# Patient Record
Sex: Female | Born: 1945 | Race: White | Hispanic: No | Marital: Married | Smoking: Never smoker
Health system: Southern US, Community
[De-identification: ages and names within clinical notes are randomized; demographics above are authoritative.]

## PROBLEM LIST (undated history)

## (undated) DIAGNOSIS — K219 Gastro-esophageal reflux disease without esophagitis: Secondary | ICD-10-CM

## (undated) DIAGNOSIS — I10 Essential (primary) hypertension: Secondary | ICD-10-CM

## (undated) DIAGNOSIS — C801 Malignant (primary) neoplasm, unspecified: Secondary | ICD-10-CM

## (undated) DIAGNOSIS — E785 Hyperlipidemia, unspecified: Secondary | ICD-10-CM

## (undated) DIAGNOSIS — E079 Disorder of thyroid, unspecified: Secondary | ICD-10-CM

## (undated) HISTORY — DX: Hyperlipidemia, unspecified: E78.5

## (undated) HISTORY — PX: SKIN CANCER EXCISION: SHX779

## (undated) HISTORY — DX: Essential (primary) hypertension: I10

## (undated) HISTORY — DX: Disorder of thyroid, unspecified: E07.9

## (undated) HISTORY — DX: Gastro-esophageal reflux disease without esophagitis: K21.9

## (undated) HISTORY — PX: APPENDECTOMY: SHX54

---

## 1979-07-05 DIAGNOSIS — C801 Malignant (primary) neoplasm, unspecified: Secondary | ICD-10-CM

## 1979-07-05 HISTORY — DX: Malignant (primary) neoplasm, unspecified: C80.1

## 1998-04-07 ENCOUNTER — Ambulatory Visit (HOSPITAL_COMMUNITY): Admission: RE | Admit: 1998-04-07 | Discharge: 1998-04-07 | Payer: Self-pay | Admitting: Neurosurgery

## 1998-04-07 ENCOUNTER — Encounter: Payer: Self-pay | Admitting: Neurosurgery

## 2005-06-16 ENCOUNTER — Ambulatory Visit: Payer: Self-pay | Admitting: Family Medicine

## 2006-06-20 ENCOUNTER — Ambulatory Visit: Payer: Self-pay | Admitting: Family Medicine

## 2006-07-04 HISTORY — PX: COLONOSCOPY: SHX174

## 2007-02-19 ENCOUNTER — Ambulatory Visit: Payer: Self-pay | Admitting: Gastroenterology

## 2007-08-01 ENCOUNTER — Ambulatory Visit: Payer: Self-pay | Admitting: Family Medicine

## 2008-03-26 ENCOUNTER — Ambulatory Visit: Payer: Self-pay | Admitting: Family Medicine

## 2008-08-05 ENCOUNTER — Ambulatory Visit: Payer: Self-pay | Admitting: Family Medicine

## 2009-08-10 ENCOUNTER — Ambulatory Visit: Payer: Self-pay | Admitting: Family Medicine

## 2010-08-12 ENCOUNTER — Ambulatory Visit: Payer: Self-pay | Admitting: Family Medicine

## 2011-08-16 ENCOUNTER — Ambulatory Visit: Payer: Self-pay | Admitting: Family Medicine

## 2011-08-18 ENCOUNTER — Ambulatory Visit: Payer: Self-pay | Admitting: Family Medicine

## 2012-03-19 ENCOUNTER — Ambulatory Visit: Payer: Self-pay | Admitting: Family Medicine

## 2012-08-23 ENCOUNTER — Ambulatory Visit: Payer: Self-pay | Admitting: Family Medicine

## 2013-09-03 ENCOUNTER — Ambulatory Visit: Payer: Self-pay | Admitting: Family Medicine

## 2014-09-16 ENCOUNTER — Ambulatory Visit: Payer: Self-pay | Admitting: Family Medicine

## 2015-01-30 ENCOUNTER — Encounter: Payer: Self-pay | Admitting: Family Medicine

## 2015-01-30 ENCOUNTER — Ambulatory Visit (INDEPENDENT_AMBULATORY_CARE_PROVIDER_SITE_OTHER): Payer: Medicare Other | Admitting: Family Medicine

## 2015-01-30 VITALS — BP 110/70 | HR 80 | Ht 66.0 in | Wt 148.0 lb

## 2015-01-30 DIAGNOSIS — E039 Hypothyroidism, unspecified: Secondary | ICD-10-CM | POA: Insufficient documentation

## 2015-01-30 DIAGNOSIS — E032 Hypothyroidism due to medicaments and other exogenous substances: Secondary | ICD-10-CM | POA: Diagnosis not present

## 2015-01-30 DIAGNOSIS — I10 Essential (primary) hypertension: Secondary | ICD-10-CM

## 2015-01-30 DIAGNOSIS — K219 Gastro-esophageal reflux disease without esophagitis: Secondary | ICD-10-CM

## 2015-01-30 DIAGNOSIS — E785 Hyperlipidemia, unspecified: Secondary | ICD-10-CM

## 2015-01-30 DIAGNOSIS — E7800 Pure hypercholesterolemia, unspecified: Secondary | ICD-10-CM | POA: Insufficient documentation

## 2015-01-30 MED ORDER — LEVOTHYROXINE SODIUM 25 MCG PO TABS: 25.0000 ug | ORAL_TABLET | Freq: Every day | ORAL | Status: DC

## 2015-01-30 MED ORDER — AMLODIPINE BESYLATE 5 MG PO TABS: 5.0000 mg | ORAL_TABLET | Freq: Every day | ORAL | Status: DC

## 2015-01-30 MED ORDER — EZETIMIBE 10 MG PO TABS: 10.0000 mg | ORAL_TABLET | Freq: Every day | ORAL | Status: DC

## 2015-01-30 MED ORDER — LOSARTAN POTASSIUM 50 MG PO TABS: 50.0000 mg | ORAL_TABLET | Freq: Every day | ORAL | Status: DC

## 2015-01-30 MED ORDER — PANTOPRAZOLE SODIUM 40 MG PO TBEC: 40.0000 mg | DELAYED_RELEASE_TABLET | Freq: Every day | ORAL | Status: DC

## 2015-01-30 NOTE — Progress Notes (Signed)
Name: Victoria Rush   MRN: [id number]    DOB: April 08, 1946   Date:01/30/2015       Progress Note  Subjective  Chief Complaint  Chief Complaint  Patient presents with  . Hypertension  . Hyperlipidemia  . Hypothyroidism  . Gastrophageal Reflux    Hypertension This is a recurrent problem. The current episode started more than 1 year ago. The problem has been gradually improving since onset. Pertinent negatives include no anxiety, blurred vision, chest pain, headaches, malaise/fatigue, neck pain, orthopnea, palpitations, peripheral edema, PND, shortness of breath or sweats. There are no associated agents to hypertension. Risk factors for coronary artery disease include dyslipidemia and post-menopausal state. Past treatments include calcium channel blockers and angiotensin blockers. The current treatment provides significant improvement. There are no compliance problems.  Hypertensive end-organ damage includes a thyroid problem. There is no history of angina, kidney disease, CAD/MI, CVA, heart failure, left ventricular hypertrophy, PVD or retinopathy. There is no history of chronic renal disease.  Hyperlipidemia This is a chronic problem. The current episode started more than 1 year ago. The problem is controlled. Recent lipid tests were reviewed and are normal. She has no history of chronic renal disease, diabetes, obesity or nephrotic syndrome. There are no known factors aggravating her hyperlipidemia. Pertinent negatives include no chest pain, focal weakness, myalgias or shortness of breath. Current antihyperlipidemic treatment includes ezetimibe. The current treatment provides moderate improvement of lipids. There are no compliance problems.   Gastrophageal Reflux She complains of heartburn. She reports no abdominal pain, no belching, no chest pain, no choking, no coughing, no dysphagia, no early satiety, no globus sensation, no hoarse voice, no nausea, no sore throat, no stridor or no wheezing. "once  in a while". This is a chronic problem. The current episode started more than 1 year ago. The problem occurs rarely. The problem has been gradually improving. The heartburn does not wake her from sleep. The heartburn does not limit her activity. Pertinent negatives include no anemia, fatigue, melena, muscle weakness, orthopnea or weight loss. She has tried a PPI for the symptoms. The treatment provided moderate relief.  Thyroid Problem Presents for follow-up visit. Patient reports no anxiety, cold intolerance, constipation, depressed mood, diaphoresis, diarrhea, dry skin, fatigue, hair loss, heat intolerance, hoarse voice, leg swelling, menstrual problem, nail problem, palpitations, weight gain or weight loss. The symptoms have been stable. Past treatments include levothyroxine. The treatment provided moderate relief. Her past medical history is significant for hyperlipidemia and neuropathy. There is no history of atrial fibrillation, diabetes, Graves' ophthalmopathy or heart failure. (Improved with exercise/ desires to stop gabapentin) There are no known risk factors.    No problem-specific assessment & plan notes found for this encounter.   Past Medical History  Diagnosis Date  . GERD (gastroesophageal reflux disease)   . Thyroid disease   . Hyperlipidemia   . Hypertension     Past Surgical History  Procedure Laterality Date  . Appendectomy    . Skin cancer excision      foot  . Colonoscopy  2008    Dr Sonny Masters- repeat in 10 years    Family History  Problem Relation Age of Onset  . Diabetes Sister   . Diabetes Son     History   Social History  . Marital Status: Married    Spouse Name: N/A  . Number of Children: N/A  . Years of Education: N/A   Occupational History  . Not on file.   Social History Main Topics  .  Smoking status: Never Smoker   . Smokeless tobacco: Not on file  . Alcohol Use: No  . Drug Use: No  . Sexual Activity: Not Currently   Other Topics Concern  .  Not on file   Social History Narrative  . No narrative on file    Allergies  Allergen Reactions  . Sulfa Antibiotics      Review of Systems  Constitutional: Negative for fever, chills, weight loss, weight gain, malaise/fatigue, diaphoresis and fatigue.  HENT: Negative for ear discharge, ear pain, hoarse voice and sore throat.   Eyes: Negative for blurred vision.  Respiratory: Negative for cough, sputum production, choking, shortness of breath and wheezing.   Cardiovascular: Negative for chest pain, palpitations, orthopnea, leg swelling and PND.  Gastrointestinal: Positive for heartburn. Negative for dysphagia, nausea, abdominal pain, diarrhea, constipation, blood in stool and melena.  Genitourinary: Negative for dysuria, urgency, frequency, hematuria and menstrual problem.  Musculoskeletal: Negative for myalgias, back pain, joint pain, muscle weakness and neck pain.  Skin: Negative for rash.  Neurological: Negative for dizziness, tingling, sensory change, focal weakness and headaches.  Endo/Heme/Allergies: Negative for environmental allergies, cold intolerance, heat intolerance and polydipsia. Does not bruise/bleed easily.  Psychiatric/Behavioral: Negative for depression and suicidal ideas. The patient is not nervous/anxious and does not have insomnia.      Objective  Filed Vitals:   01/30/15 0808  BP: 110/70  Pulse: 80  Height: 5\' 6"  (1.676 m)  Weight: 148 lb (67.132 kg)    Physical Exam  Constitutional: She is well-developed, well-nourished, and in no distress. No distress.  HENT:  Head: Normocephalic and atraumatic.  Right Ear: External ear normal.  Left Ear: External ear normal.  Nose: Nose normal.  Mouth/Throat: Oropharynx is clear and moist.  Eyes: Conjunctivae and EOM are normal. Pupils are equal, round, and reactive to light. Right eye exhibits no discharge. Left eye exhibits no discharge.  Neck: Normal range of motion. Neck supple. No JVD present. No  thyromegaly present.  Cardiovascular: Normal rate, regular rhythm, normal heart sounds and intact distal pulses.  Exam reveals no gallop and no friction rub.   No murmur heard. Pulmonary/Chest: Effort normal and breath sounds normal.  Abdominal: Soft. Bowel sounds are normal. She exhibits no mass. There is no tenderness. There is no guarding.  Musculoskeletal: Normal range of motion. She exhibits no edema.  Lymphadenopathy:    She has no cervical adenopathy.  Neurological: She is alert. She has normal reflexes.  Skin: Skin is warm and dry. She is not diaphoretic.  Psychiatric: Mood and affect normal.      Assessment & Plan  Problem List Items Addressed This Visit      Endocrine   Hypothyroidism   Relevant Medications   levothyroxine (SYNTHROID, LEVOTHROID) 25 MCG tablet   Other Relevant Orders   TSH     Other   Hyperlipidemia   Relevant Medications   aspirin 81 MG tablet   amLODipine (NORVASC) 5 MG tablet   ezetimibe (ZETIA) 10 MG tablet   losartan (COZAAR) 50 MG tablet   Other Relevant Orders   Lipid Profile    Other Visit Diagnoses    Essential hypertension    -  Primary    Relevant Medications    aspirin 81 MG tablet    amLODipine (NORVASC) 5 MG tablet    ezetimibe (ZETIA) 10 MG tablet    losartan (COZAAR) 50 MG tablet    Other Relevant Orders    Renal Function Panel  Gastroesophageal reflux disease, esophagitis presence not specified        Relevant Medications    pantoprazole (PROTONIX) 40 MG tablet         Dr. Mac[facility]  01/30/2015

## 2015-01-31 LAB — LIPID PANEL
CHOL/HDL RATIO: 4.2 ratio (ref 0.0–4.4)
Cholesterol, Total: 172 mg/dL (ref 100–199)
HDL: 41 mg/dL (ref >39)
LDL Calculated: 102 mg/dL — ABNORMAL HIGH (ref 0–99)
TRIGLYCERIDES: 146 mg/dL (ref 0–149)
VLDL Cholesterol Cal: 29 mg/dL (ref 5–40)

## 2015-01-31 LAB — TSH: TSH: 3.21 u[IU]/mL (ref 0.450–4.500)

## 2015-01-31 LAB — RENAL FUNCTION PANEL
Albumin: 4.4 g/dL (ref 3.6–4.8)
BUN/Creatinine Ratio: 8 — ABNORMAL LOW (ref 11–26)
BUN: 7 mg/dL — ABNORMAL LOW (ref 8–27)
CALCIUM: 9.2 mg/dL (ref 8.7–10.3)
CO2: 25 mmol/L (ref 18–29)
CREATININE: 0.85 mg/dL (ref 0.57–1.00)
Chloride: 99 mmol/L (ref 97–108)
GFR calc Af Amer: 81 mL/min/{1.73_m2} (ref >59)
GFR calc non Af Amer: 70 mL/min/{1.73_m2} (ref >59)
Glucose: 100 mg/dL — ABNORMAL HIGH (ref 65–99)
Phosphorus: 3.6 mg/dL (ref 2.5–4.5)
Potassium: 4.5 mmol/L (ref 3.5–5.2)
Sodium: 140 mmol/L (ref 134–144)

## 2015-03-25 ENCOUNTER — Other Ambulatory Visit: Payer: Self-pay | Admitting: Family Medicine

## 2015-03-25 DIAGNOSIS — E785 Hyperlipidemia, unspecified: Secondary | ICD-10-CM

## 2015-05-11 ENCOUNTER — Ambulatory Visit (INDEPENDENT_AMBULATORY_CARE_PROVIDER_SITE_OTHER): Payer: Medicare Other

## 2015-05-11 DIAGNOSIS — Z23 Encounter for immunization: Secondary | ICD-10-CM | POA: Diagnosis not present

## 2015-06-08 ENCOUNTER — Encounter: Payer: Self-pay | Admitting: Family Medicine

## 2015-06-08 ENCOUNTER — Ambulatory Visit (INDEPENDENT_AMBULATORY_CARE_PROVIDER_SITE_OTHER): Payer: Medicare Other | Admitting: Family Medicine

## 2015-06-08 VITALS — BP 100/70 | HR 64 | Temp 98.4°F | Ht 66.0 in | Wt 143.0 lb

## 2015-06-08 DIAGNOSIS — J0100 Acute maxillary sinusitis, unspecified: Secondary | ICD-10-CM

## 2015-06-08 DIAGNOSIS — J40 Bronchitis, not specified as acute or chronic: Secondary | ICD-10-CM | POA: Diagnosis not present

## 2015-06-08 MED ORDER — AMOXICILLIN 500 MG PO CAPS: 500.0000 mg | ORAL_CAPSULE | Freq: Three times a day (TID) | ORAL | Status: DC

## 2015-06-08 MED ORDER — GUAIFENESIN-CODEINE 100-10 MG/5ML PO SOLN: 5.0000 mL | Freq: Three times a day (TID) | ORAL | Status: DC | PRN

## 2015-06-08 NOTE — Progress Notes (Signed)
Name: Victoria Rush   MRN: [id number]    DOB: 05-Jun-1946   Date:06/08/2015       Progress Note  Subjective  Chief Complaint  Chief Complaint  Patient presents with  . Sinusitis    cough no production- otc Mucinex not working    Sinusitis This is a new problem. The current episode started in the past 7 days. The problem has been waxing and waning since onset. There has been no fever. Her pain is at a severity of 3/10. The pain is mild. Associated symptoms include chills, congestion, coughing, headaches, a hoarse voice, a sore throat and swollen glands. Pertinent negatives include no diaphoresis, ear pain, neck pain, shortness of breath, sinus pressure or sneezing. Past treatments include acetaminophen and oral decongestants. The treatment provided no relief.    No problem-specific assessment & plan notes found for this encounter.   Past Medical History  Diagnosis Date  . GERD (gastroesophageal reflux disease)   . Thyroid disease   . Hyperlipidemia   . Hypertension     Past Surgical History  Procedure Laterality Date  . Appendectomy    . Skin cancer excision      foot  . Colonoscopy  2008    Dr Sonny Masters- repeat in 10 years    Family History  Problem Relation Age of Onset  . Diabetes Sister   . Diabetes Son     Social History   Social History  . Marital Status: Married    Spouse Name: N/A  . Number of Children: N/A  . Years of Education: N/A   Occupational History  . Not on file.   Social History Main Topics  . Smoking status: Never Smoker   . Smokeless tobacco: Not on file  . Alcohol Use: No  . Drug Use: No  . Sexual Activity: Not Currently   Other Topics Concern  . Not on file   Social History Narrative    Allergies  Allergen Reactions  . Sulfa Antibiotics      Review of Systems  Constitutional: Positive for chills. Negative for fever, weight loss, malaise/fatigue and diaphoresis.  HENT: Positive for congestion, hoarse voice and sore throat.  Negative for ear discharge, ear pain, sinus pressure and sneezing.   Eyes: Negative for blurred vision.  Respiratory: Positive for cough. Negative for sputum production, shortness of breath and wheezing.   Cardiovascular: Negative for chest pain, palpitations and leg swelling.  Gastrointestinal: Negative for heartburn, nausea, abdominal pain, diarrhea, constipation, blood in stool and melena.  Genitourinary: Negative for dysuria, urgency, frequency and hematuria.  Musculoskeletal: Negative for myalgias, back pain, joint pain and neck pain.  Skin: Negative for rash.  Neurological: Positive for headaches. Negative for dizziness, tingling, sensory change and focal weakness.  Endo/Heme/Allergies: Negative for environmental allergies and polydipsia. Does not bruise/bleed easily.  Psychiatric/Behavioral: Negative for depression and suicidal ideas. The patient is not nervous/anxious and does not have insomnia.      Objective  Filed Vitals:   06/08/15 1538  BP: 100/70  Pulse: 64  Temp: 98.4 F (36.9 C)  TempSrc: Oral  Height: 5\' 6"  (1.676 m)  Weight: 143 lb (64.864 kg)    Physical Exam  Constitutional: She is well-developed, well-nourished, and in no distress. No distress.  HENT:  Head: Normocephalic and atraumatic.  Right Ear: External ear normal.  Left Ear: External ear normal.  Nose: Nose normal.  Mouth/Throat: Oropharynx is clear and moist.  Eyes: Conjunctivae and EOM are normal. Pupils are equal, round, and  reactive to light. Right eye exhibits no discharge. Left eye exhibits no discharge.  Neck: Normal range of motion. Neck supple. No JVD present. No thyromegaly present.  Cardiovascular: Normal rate, regular rhythm, normal heart sounds and intact distal pulses.  Exam reveals no gallop and no friction rub.   No murmur heard. Pulmonary/Chest: Effort normal and breath sounds normal.  Abdominal: Soft. Bowel sounds are normal. She exhibits no mass. There is no tenderness. There is no  guarding.  Musculoskeletal: Normal range of motion. She exhibits no edema.  Lymphadenopathy:    She has no cervical adenopathy.  Neurological: She is alert. She has normal reflexes.  Skin: Skin is warm and dry. She is not diaphoretic.  Psychiatric: Mood and affect normal.      Assessment & Plan  Problem List Items Addressed This Visit    None    Visit Diagnoses    Acute maxillary sinusitis, recurrence not specified    -  Primary    Relevant Medications    amoxicillin (AMOXIL) 500 MG capsule    guaiFENesin-codeine 100-10 MG/5ML syrup    Bronchitis        Relevant Medications    guaiFENesin-codeine 100-10 MG/5ML syrup         Dr. [facility]  06/08/2015

## 2015-07-14 ENCOUNTER — Encounter: Payer: Self-pay | Admitting: Family Medicine

## 2015-07-14 ENCOUNTER — Ambulatory Visit (INDEPENDENT_AMBULATORY_CARE_PROVIDER_SITE_OTHER): Payer: Medicare Other | Admitting: Family Medicine

## 2015-07-14 VITALS — BP 110/80 | HR 76 | Ht 66.0 in | Wt 142.0 lb

## 2015-07-14 DIAGNOSIS — L509 Urticaria, unspecified: Secondary | ICD-10-CM

## 2015-07-14 DIAGNOSIS — L508 Other urticaria: Secondary | ICD-10-CM

## 2015-07-14 MED ORDER — PREDNISONE 10 MG PO TABS: ORAL_TABLET | ORAL | Status: DC

## 2015-07-14 MED ORDER — RANITIDINE HCL 150 MG PO TABS: 150.0000 mg | ORAL_TABLET | Freq: Two times a day (BID) | ORAL | Status: DC

## 2015-07-14 MED ORDER — CETIRIZINE HCL 10 MG PO TABS: 10.0000 mg | ORAL_TABLET | Freq: Every day | ORAL | Status: DC

## 2015-07-14 MED ORDER — EUCERIN EX LOTN: TOPICAL_LOTION | CUTANEOUS | Status: DC | PRN

## 2015-07-14 NOTE — Progress Notes (Signed)
Name: Victoria Rush   MRN: [id number]    DOB: 1946-03-05   Date:07/14/2015       Progress Note  Subjective  Chief Complaint  Chief Complaint  Patient presents with  . Urticaria    itching across abdomen, down arms and legs x 4 weeks- has changed detergent and soap since the itching began- hasn't helped    Urticaria This is a new problem. The current episode started 1 to 4 weeks ago. The problem has been waxing and waning since onset. The rash is diffuse. The rash is characterized by itchiness. She was exposed to nothing. Pertinent negatives include no anorexia, congestion, cough, diarrhea, eye pain, facial edema, fatigue, fever, joint pain, nail changes, rhinorrhea, shortness of breath, sore throat or vomiting. Past treatments include moisturizer. The treatment provided mild relief. There is no history of allergies or eczema.    No problem-specific assessment & plan notes found for this encounter.   Past Medical History  Diagnosis Date  . GERD (gastroesophageal reflux disease)   . Thyroid disease   . Hyperlipidemia   . Hypertension     Past Surgical History  Procedure Laterality Date  . Appendectomy    . Skin cancer excision      foot  . Colonoscopy  2008    Dr Sonny Masters- repeat in 10 years    Family History  Problem Relation Age of Onset  . Diabetes Sister   . Diabetes Son     Social History   Social History  . Marital Status: Married    Spouse Name: N/A  . Number of Children: N/A  . Years of Education: N/A   Occupational History  . Not on file.   Social History Main Topics  . Smoking status: Never Smoker   . Smokeless tobacco: Not on file  . Alcohol Use: No  . Drug Use: No  . Sexual Activity: Not Currently   Other Topics Concern  . Not on file   Social History Narrative    Allergies  Allergen Reactions  . Sulfa Antibiotics      Review of Systems  Constitutional: Negative for fever, chills, weight loss, malaise/fatigue and fatigue.  HENT: Negative  for congestion, ear discharge, ear pain, rhinorrhea and sore throat.   Eyes: Negative for blurred vision and pain.  Respiratory: Negative for cough, sputum production, shortness of breath and wheezing.   Cardiovascular: Negative for chest pain, palpitations and leg swelling.  Gastrointestinal: Negative for heartburn, nausea, vomiting, abdominal pain, diarrhea, constipation, blood in stool, melena and anorexia.  Genitourinary: Negative for dysuria, urgency, frequency and hematuria.  Musculoskeletal: Negative for myalgias, back pain, joint pain and neck pain.  Skin: Positive for itching. Negative for nail changes and rash.  Neurological: Negative for dizziness, tingling, sensory change, focal weakness and headaches.  Endo/Heme/Allergies: Negative for environmental allergies and polydipsia. Does not bruise/bleed easily.  Psychiatric/Behavioral: Negative for depression and suicidal ideas. The patient is not nervous/anxious and does not have insomnia.      Objective  Filed Vitals:   07/14/15 1403  BP: 110/80  Pulse: 76  Height: 5\' 6"  (1.676 m)  Weight: 142 lb (64.411 kg)    Physical Exam  Constitutional: She is well-developed, well-nourished, and in no distress. No distress.  HENT:  Head: Normocephalic and atraumatic.  Right Ear: External ear normal.  Left Ear: External ear normal.  Nose: Nose normal.  Mouth/Throat: Oropharynx is clear and moist.  Eyes: Conjunctivae and EOM are normal. Pupils are equal, round, and reactive  to light. Right eye exhibits no discharge. Left eye exhibits no discharge.  Neck: Normal range of motion. Neck supple. No JVD present. No thyromegaly present.  Cardiovascular: Normal rate, regular rhythm, normal heart sounds and intact distal pulses.  Exam reveals no gallop and no friction rub.   No murmur heard. Pulmonary/Chest: Effort normal and breath sounds normal.  Abdominal: Soft. Bowel sounds are normal. She exhibits no mass. There is no tenderness. There is  no guarding.  Musculoskeletal: Normal range of motion. She exhibits no edema.  Lymphadenopathy:    She has no cervical adenopathy.  Neurological: She is alert. She has normal reflexes.  Skin: Skin is warm and dry. She is not diaphoretic.  Psychiatric: Mood and affect normal.  Nursing note and vitals reviewed.     Assessment & Plan  Problem List Items Addressed This Visit    None    Visit Diagnoses    Urticaria of entire body    -  Primary    Relevant Medications    cetirizine (ZYRTEC) 10 MG tablet    predniSONE (DELTASONE) 10 MG tablet    ranitidine (ZANTAC) 150 MG tablet    Emollient (EUCERIN) lotion         Dr. Nancy Arvin [facility]  07/14/2015

## 2015-07-27 ENCOUNTER — Other Ambulatory Visit: Payer: Self-pay

## 2015-07-27 DIAGNOSIS — I10 Essential (primary) hypertension: Secondary | ICD-10-CM

## 2015-07-27 MED ORDER — AMLODIPINE BESYLATE 5 MG PO TABS: 5.0000 mg | ORAL_TABLET | Freq: Every day | ORAL | Status: DC

## 2015-07-27 MED ORDER — LOSARTAN POTASSIUM 50 MG PO TABS: 50.0000 mg | ORAL_TABLET | Freq: Every day | ORAL | Status: DC

## 2015-07-30 ENCOUNTER — Other Ambulatory Visit: Payer: Self-pay | Admitting: Family Medicine

## 2015-08-03 ENCOUNTER — Encounter: Payer: Self-pay | Admitting: Family Medicine

## 2015-08-03 ENCOUNTER — Ambulatory Visit (INDEPENDENT_AMBULATORY_CARE_PROVIDER_SITE_OTHER): Payer: Medicare Other | Admitting: Family Medicine

## 2015-08-03 VITALS — BP 130/78 | HR 80 | Ht 66.0 in | Wt 144.0 lb

## 2015-08-03 DIAGNOSIS — B86 Scabies: Secondary | ICD-10-CM

## 2015-08-03 MED ORDER — PERMETHRIN 5 % EX CREA: 1.0000 "application " | TOPICAL_CREAM | Freq: Once | CUTANEOUS | Status: DC

## 2015-08-03 MED ORDER — HYDROXYZINE HCL 10 MG PO TABS: 10.0000 mg | ORAL_TABLET | Freq: Three times a day (TID) | ORAL | Status: DC | PRN

## 2015-08-03 NOTE — Patient Instructions (Signed)

## 2015-08-03 NOTE — Progress Notes (Signed)
Name: Victoria Rush   MRN: [id number]    DOB: Jun 22, 1946   Date:08/03/2015       Progress Note  Subjective  Chief Complaint  Chief Complaint  Patient presents with  . Urticaria    itching on abdomen and around neck- grandchildren Dx with scabies    HPI  No problem-specific assessment & plan notes found for this encounter.   Past Medical History  Diagnosis Date  . GERD (gastroesophageal reflux disease)   . Thyroid disease   . Hyperlipidemia   . Hypertension     Past Surgical History  Procedure Laterality Date  . Appendectomy    . Skin cancer excision      foot  . Colonoscopy  2008    Dr Sonny Masters- repeat in 10 years    Family History  Problem Relation Age of Onset  . Diabetes Sister   . Diabetes Son     Social History   Social History  . Marital Status: Married    Spouse Name: N/A  . Number of Children: N/A  . Years of Education: N/A   Occupational History  . Not on file.   Social History Main Topics  . Smoking status: Never Smoker   . Smokeless tobacco: Not on file  . Alcohol Use: No  . Drug Use: No  . Sexual Activity: Not Currently   Other Topics Concern  . Not on file   Social History Narrative    Allergies  Allergen Reactions  . Sulfa Antibiotics      Review of Systems  Constitutional: Negative for fever, chills, weight loss and malaise/fatigue.  HENT: Negative for ear discharge, ear pain and sore throat.   Eyes: Negative for blurred vision.  Respiratory: Negative for cough, sputum production, shortness of breath and wheezing.   Cardiovascular: Negative for chest pain, palpitations and leg swelling.  Gastrointestinal: Negative for heartburn, nausea, abdominal pain, diarrhea, constipation, blood in stool and melena.  Genitourinary: Negative for dysuria, urgency, frequency and hematuria.  Musculoskeletal: Negative for myalgias, back pain, joint pain and neck pain.  Skin: Negative for rash.  Neurological: Negative for dizziness, tingling,  sensory change, focal weakness and headaches.  Endo/Heme/Allergies: Negative for environmental allergies and polydipsia. Does not bruise/bleed easily.  Psychiatric/Behavioral: Negative for depression and suicidal ideas. The patient is not nervous/anxious and does not have insomnia.      Objective  Filed Vitals:   08/03/15 1351  BP: 130/78  Pulse: 80  Height: 5\' 6"  (1.676 m)  Weight: 144 lb (65.318 kg)    Physical Exam  Constitutional: She is well-developed, well-nourished, and in no distress. No distress.  HENT:  Head: Normocephalic and atraumatic.  Right Ear: External ear normal.  Left Ear: External ear normal.  Nose: Nose normal.  Mouth/Throat: Oropharynx is clear and moist.  Eyes: Conjunctivae and EOM are normal. Pupils are equal, round, and reactive to light. Right eye exhibits no discharge. Left eye exhibits no discharge.  Neck: Normal range of motion. Neck supple. No JVD present. No thyromegaly present.  Cardiovascular: Normal rate, regular rhythm, normal heart sounds and intact distal pulses.  Exam reveals no gallop and no friction rub.   No murmur heard. Pulmonary/Chest: Effort normal and breath sounds normal. She has no wheezes.  Abdominal: Soft. Bowel sounds are normal. She exhibits no mass. There is no tenderness. There is no guarding.  Musculoskeletal: Normal range of motion. She exhibits no edema.  Lymphadenopathy:    She has no cervical adenopathy.  Neurological: She is  alert. She has normal reflexes.  Skin: Skin is warm and dry. Rash noted. She is not diaphoretic. There is erythema.  Psychiatric: Mood and affect normal.  Nursing note and vitals reviewed.     Assessment & Plan  Problem List Items Addressed This Visit    None    Visit Diagnoses    Scabies    -  Primary    Relevant Medications    permethrin (ELIMITE) 5 % cream         Dr. [facility]  08/03/2015

## 2015-08-14 ENCOUNTER — Encounter: Payer: Self-pay | Admitting: Family Medicine

## 2015-08-14 ENCOUNTER — Ambulatory Visit (INDEPENDENT_AMBULATORY_CARE_PROVIDER_SITE_OTHER): Payer: Medicare Other | Admitting: Family Medicine

## 2015-08-14 VITALS — BP 130/80 | HR 80 | Ht 66.0 in | Wt 143.0 lb

## 2015-08-14 DIAGNOSIS — E039 Hypothyroidism, unspecified: Secondary | ICD-10-CM | POA: Diagnosis not present

## 2015-08-14 DIAGNOSIS — I10 Essential (primary) hypertension: Secondary | ICD-10-CM

## 2015-08-14 DIAGNOSIS — Z23 Encounter for immunization: Secondary | ICD-10-CM

## 2015-08-14 DIAGNOSIS — B86 Scabies: Secondary | ICD-10-CM

## 2015-08-14 DIAGNOSIS — E785 Hyperlipidemia, unspecified: Secondary | ICD-10-CM | POA: Diagnosis not present

## 2015-08-14 DIAGNOSIS — Z1211 Encounter for screening for malignant neoplasm of colon: Secondary | ICD-10-CM | POA: Diagnosis not present

## 2015-08-14 DIAGNOSIS — Z9189 Other specified personal risk factors, not elsewhere classified: Secondary | ICD-10-CM

## 2015-08-14 DIAGNOSIS — K219 Gastro-esophageal reflux disease without esophagitis: Secondary | ICD-10-CM

## 2015-08-14 DIAGNOSIS — Z1239 Encounter for other screening for malignant neoplasm of breast: Secondary | ICD-10-CM

## 2015-08-14 DIAGNOSIS — Z0000 Encounter for general adult medical examination without abnormal findings: Secondary | ICD-10-CM

## 2015-08-14 DIAGNOSIS — Z1159 Encounter for screening for other viral diseases: Secondary | ICD-10-CM | POA: Diagnosis not present

## 2015-08-14 DIAGNOSIS — Z87898 Personal history of other specified conditions: Secondary | ICD-10-CM

## 2015-08-14 MED ORDER — LEVOTHYROXINE SODIUM 25 MCG PO TABS: ORAL_TABLET | ORAL | Status: DC

## 2015-08-14 MED ORDER — PERMETHRIN 5 % EX CREA: 1.0000 "application " | TOPICAL_CREAM | Freq: Once | CUTANEOUS | Status: DC

## 2015-08-14 MED ORDER — AMLODIPINE BESYLATE 5 MG PO TABS: 5.0000 mg | ORAL_TABLET | Freq: Every day | ORAL | Status: DC

## 2015-08-14 MED ORDER — LOSARTAN POTASSIUM 50 MG PO TABS: 50.0000 mg | ORAL_TABLET | Freq: Every day | ORAL | Status: DC

## 2015-08-14 MED ORDER — PANTOPRAZOLE SODIUM 40 MG PO TBEC: 40.0000 mg | DELAYED_RELEASE_TABLET | Freq: Every day | ORAL | Status: DC

## 2015-08-14 NOTE — Progress Notes (Signed)
Patient: Victoria Rush, Female    DOB: February 12, 1946, 70 y.o.   MRN: [id number] Visit Date: 08/14/2015  Today's Provider: Otilio Miu, MD   Chief Complaint  Patient presents with  . medicare annual wellness  . Hypothyroidism  . Hypertension  . Hyperlipidemia  . Gastroesophageal Reflux  . scabies    wants a refill on med   Subjective:   Initial preventative physical exam Victoria Rush is a 70 y.o. female who presents today for her Initial Preventative Physical Exam. She feels well. She reports exercising . She reports she is sleeping well.  Hypertension This is a chronic problem. The current episode started more than 1 year ago. The problem is unchanged. The problem is controlled. Pertinent negatives include no anxiety, blurred vision, chest pain, headaches, malaise/fatigue, neck pain, orthopnea, palpitations, peripheral edema, PND, shortness of breath or sweats. There are no associated agents to hypertension. There are no known risk factors for coronary artery disease. Past treatments include angiotensin blockers and calcium channel blockers. The current treatment provides mild improvement. There are no compliance problems.  Hypertensive end-organ damage includes a thyroid problem. There is no history of angina, kidney disease, CAD/MI, CVA, heart failure, left ventricular hypertrophy, PVD, renovascular disease or retinopathy. There is no history of chronic renal disease or a hypertension causing med.  Hyperlipidemia This is a chronic problem. The problem is controlled. Recent lipid tests were reviewed and are normal. She has no history of chronic renal disease, diabetes, hypothyroidism, liver disease, obesity or nephrotic syndrome. Pertinent negatives include no chest pain, myalgias or shortness of breath. There are no compliance problems.  Risk factors for coronary artery disease include dyslipidemia and hypertension.  Gastroesophageal Reflux She reports no abdominal pain, no belching, no chest  pain, no choking, no coughing, no dysphagia, no early satiety, no globus sensation, no heartburn, no hoarse voice, no nausea, no sore throat, no stridor, no tooth decay, no water brash or no wheezing. This is a chronic problem. The current episode started more than 1 year ago. The problem occurs occasionally. The problem has been waxing and waning. The symptoms are aggravated by certain foods. Pertinent negatives include no anemia, fatigue, melena, muscle weakness, orthopnea or weight loss. She has tried a PPI for the symptoms. The treatment provided moderate relief.  Thyroid Problem Presents for follow-up visit. Patient reports no anxiety, cold intolerance, constipation, depressed mood, diarrhea, fatigue, hair loss, heat intolerance, hoarse voice, menstrual problem, palpitations, tremors, visual change or weight loss. The symptoms have been stable. Her past medical history is significant for hyperlipidemia. There is no history of diabetes or heart failure.    Review of Systems  Constitutional: Negative.  Negative for fever, chills, weight loss, malaise/fatigue, fatigue and unexpected weight change.  HENT: Negative for congestion, ear discharge, ear pain, hoarse voice, rhinorrhea, sinus pressure, sneezing and sore throat.   Eyes: Negative for blurred vision, photophobia, pain, discharge, redness and itching.  Respiratory: Negative for cough, choking, shortness of breath, wheezing and stridor.   Cardiovascular: Negative for chest pain, palpitations, orthopnea and PND.  Gastrointestinal: Negative for heartburn, dysphagia, nausea, vomiting, abdominal pain, diarrhea, constipation, blood in stool and melena.  Endocrine: Negative for cold intolerance, heat intolerance, polydipsia, polyphagia and polyuria.  Genitourinary: Negative for dysuria, urgency, frequency, hematuria, flank pain, vaginal bleeding, vaginal discharge, menstrual problem and pelvic pain.  Musculoskeletal: Negative for myalgias, back pain,  arthralgias, muscle weakness and neck pain.  Skin: Negative for rash.  Allergic/Immunologic: Negative for environmental allergies and food allergies.  Neurological: Negative for dizziness, tremors, weakness, light-headedness, numbness and headaches.  Hematological: Negative for adenopathy. Does not bruise/bleed easily.  Psychiatric/Behavioral: Negative for dysphoric mood. The patient is not nervous/anxious.     Social History   Social History  . Marital Status: Married    Spouse Name: N/A  . Number of Children: N/A  . Years of Education: N/A   Occupational History  . Not on file.   Social History Main Topics  . Smoking status: Never Smoker   . Smokeless tobacco: Not on file  . Alcohol Use: No  . Drug Use: No  . Sexual Activity: Not Currently   Other Topics Concern  . Not on file   Social History Narrative    Patient Active Problem List   Diagnosis Date Noted  . 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 Hyperlipidemia 01/30/2015    Past Surgical History  Procedure Laterality Date  . Appendectomy    . Skin cancer excision      foot  . Colonoscopy  2008    Dr Sonny Masters- repeat in 10 years    Her family history includes Diabetes in her sister and son.    Previous Medications   ASPIRIN 81 MG TABLET    Take 81 mg by mouth daily.   CETIRIZINE (ZYRTEC) 10 MG TABLET    Take 1 tablet (10 mg total) by mouth daily.   HYDROXYZINE (ATARAX/VISTARIL) 10 MG TABLET    Take 1 tablet (10 mg total) by mouth 3 (three) times daily as needed.    Patient Care Team: Juline Patch, MD as PCP - General (Family Medicine)     Objective:   Vitals: BP 130/80 mmHg  Pulse 80  Ht 5\' 6"  (1.676 m)  Wt 143 lb (64.864 kg)  BMI 23.09 kg/m2  Physical Exam  Constitutional: She is oriented to person, place, and time. She appears well-developed and well-nourished.  HENT:  Head: Normocephalic.  Right Ear: Tympanic membrane, external ear and ear canal normal. Decreased hearing is noted.  Left Ear:  Tympanic membrane, external ear and ear canal normal. Decreased hearing is noted.  Mouth/Throat: Oropharynx is clear and moist.  Eyes: Conjunctivae and EOM are normal. Pupils are equal, round, and reactive to light. Lids are everted and swept, no foreign bodies found. Left eye exhibits no hordeolum. No foreign body present in the left eye. Right conjunctiva is not injected. Left conjunctiva is not injected. No scleral icterus.  Neck: Normal range of motion. Neck supple. No JVD present. No tracheal deviation present. No thyromegaly present.  Cardiovascular: Normal rate, regular rhythm, normal heart sounds and intact distal pulses.  Exam reveals no gallop and no friction rub.   No murmur heard. Pulmonary/Chest: Effort normal and breath sounds normal. No respiratory distress. She has no wheezes. She has no rales. Right breast exhibits no inverted nipple, no mass, no nipple discharge, no skin change and no tenderness. Left breast exhibits no inverted nipple, no mass, no nipple discharge, no skin change and no tenderness. Breasts are symmetrical.  Abdominal: Soft. Bowel sounds are normal. She exhibits no mass. There is no hepatosplenomegaly. There is no tenderness. There is no rebound and no guarding.  Genitourinary: Rectum normal. Guaiac negative stool. No breast swelling, tenderness, discharge or bleeding.  Musculoskeletal: Normal range of motion. She exhibits no edema or tenderness.  Lymphadenopathy:    She has no cervical adenopathy.  Neurological: She is alert and oriented to person, place, and time. She has normal strength. She displays normal reflexes. No cranial  nerve deficit.  Skin: Skin is warm. No rash noted.  Psychiatric: She has a normal mood and affect. Her mood appears not anxious. She does not exhibit a depressed mood.  Nursing note and vitals reviewed.    No exam data present  Activities of Daily Living In your present state of health, do you have any difficulty performing the  following activities: 08/14/2015 08/03/2015  Hearing? Tempie Donning  Vision? N N  Difficulty concentrating or making decisions? N N  Walking or climbing stairs? N N  Dressing or bathing? N N  Doing errands, shopping? N N    Fall Risk Assessment Fall Risk  08/14/2015 08/03/2015 07/14/2015 06/08/2015 01/30/2015  Falls in the past year? No No No No No     Patient reports there are safety devices in place in shower at home.   Depression Screen PHQ 2/9 Scores 08/14/2015 08/03/2015 07/14/2015 06/08/2015  PHQ - 2 Score 0 0 0 0    Cognitive Testing - 6-CIT   Correct? Score   What year is it? yes 0 Yes = 0    No = 4  What month is it? yes 0 Yes = 0    No = 3  Remember:     Pia [address], [place], [place]     What time is it? yes 0 Yes = 0    No = 3  Count backwards from 20 to 1 yes 0 Correct = 0    1 error = 2   More than 1 error = 4  Say the months of the year in reverse. no 2 Correct = 0    1 error = 2   More than 1 error = 4  What address did I ask you to remember? no 1 Correct = 0  1 error = 2    2 error = 4    3 error = 6    4 error = 8    All wrong = 10       TOTAL SCORE  1/28   Interpretation:  Abnormal- patient has decreased memory  Normal (0-7) Abnormal (8-28)     Assessment & Plan:     Initial Preventative Physical Exam  Reviewed patient's Family Medical History Reviewed and updated list of patient's medical providers Assessment of cognitive impairment was done Assessed patient's functional ability Established a written schedule for health screening [place] Completed and Reviewed  Exercise Activities and Dietary recommendations Goals    None      Immunization History  Administered Date(s) Administered  . Influenza,inj,Quad PF,36+ Mos 05/11/2015  . Pneumococcal Conjugate-13 08/14/2015    Health Maintenance  Topic Date Due  . Hepatitis C Screening  08/05/1945  . TETANUS/TDAP  07/08/1964  . MAMMOGRAM  07/09/1995  . COLONOSCOPY  07/09/1995  .  ZOSTAVAX  07/08/2005  . DEXA SCAN  07/08/2010  . INFLUENZA VACCINE  02/02/2016  . PNA vac Low Risk Adult (2 of 2 - PPSV23) 08/13/2016      Discussed health benefits of physical activity, and encouraged her to engage in regular exercise appropriate for her age and condition.    ------------------------------------------------------------------------------------------------------------   Problem List Items Addressed This Visit      Endocrine   Hypothyroidism   Relevant Medications   levothyroxine (SYNTHROID, LEVOTHROID) 25 MCG tablet   Other Relevant Orders   TSH     Other   Hyperlipidemia   Relevant Medications   amLODipine (NORVASC) 5 MG tablet  losartan (COZAAR) 50 MG tablet   Other Relevant Orders   Lipid Profile    Other Visit Diagnoses    Medicare annual wellness visit, subsequent    -  Primary    Essential hypertension        Relevant Medications    amLODipine (NORVASC) 5 MG tablet    losartan (COZAAR) 50 MG tablet    Other Relevant Orders    Renal Function Panel    Gastroesophageal reflux disease, esophagitis presence not specified        Relevant Medications    pantoprazole (PROTONIX) 40 MG tablet    Scabies        Relevant Medications    permethrin (ELIMITE) 5 % cream    Colon cancer screening        Relevant Orders    POCT Occult Blood Stool    Breast cancer screening        Relevant Orders    MM Digital Screening    At risk for bone density loss        Relevant Orders    DG Bone Density    Need for hepatitis C screening test        Relevant Orders    Hepatitis C antibody    Need for pneumococcal vaccination        Relevant Orders    Pneumococcal conjugate vaccine 13-valent (Completed)        Otilio Miu, MD [facility]  08/14/2015

## 2015-08-15 LAB — LIPID PANEL
CHOL/HDL RATIO: 3.5 ratio (ref 0.0–4.4)
Cholesterol, Total: 186 mg/dL (ref 100–199)
HDL: 53 mg/dL (ref >39)
LDL Calculated: 109 mg/dL — ABNORMAL HIGH (ref 0–99)
Triglycerides: 121 mg/dL (ref 0–149)
VLDL CHOLESTEROL CAL: 24 mg/dL (ref 5–40)

## 2015-08-15 LAB — RENAL FUNCTION PANEL
Albumin: 4.3 g/dL (ref 3.5–4.8)
BUN / CREAT RATIO: 12 (ref 11–26)
BUN: 10 mg/dL (ref 8–27)
CO2: 27 mmol/L (ref 18–29)
CREATININE: 0.81 mg/dL (ref 0.57–1.00)
Calcium: 9.2 mg/dL (ref 8.7–10.3)
Chloride: 101 mmol/L (ref 96–106)
GFR calc non Af Amer: 74 mL/min/{1.73_m2} (ref >59)
GFR, EST AFRICAN AMERICAN: 85 mL/min/{1.73_m2} (ref >59)
Glucose: 88 mg/dL (ref 65–99)
Phosphorus: 3.6 mg/dL (ref 2.5–4.5)
Potassium: 4.9 mmol/L (ref 3.5–5.2)
Sodium: 142 mmol/L (ref 134–144)

## 2015-08-15 LAB — HEPATITIS C ANTIBODY

## 2015-08-15 LAB — TSH: TSH: 2.61 u[IU]/mL (ref 0.450–4.500)

## 2015-08-19 ENCOUNTER — Other Ambulatory Visit: Payer: Self-pay

## 2015-09-21 ENCOUNTER — Ambulatory Visit
Admission: RE | Admit: 2015-09-21 | Discharge: 2015-09-21 | Disposition: A | Discharge status: Home / Self Care | Payer: Medicare Other | Source: Ambulatory Visit | Attending: Family Medicine | Admitting: Family Medicine

## 2015-09-21 DIAGNOSIS — Z9189 Other specified personal risk factors, not elsewhere classified: Secondary | ICD-10-CM

## 2015-09-21 DIAGNOSIS — Z13820 Encounter for screening for osteoporosis: Secondary | ICD-10-CM | POA: Insufficient documentation

## 2015-09-21 DIAGNOSIS — Z1231 Encounter for screening mammogram for malignant neoplasm of breast: Secondary | ICD-10-CM | POA: Insufficient documentation

## 2015-09-21 DIAGNOSIS — Z1239 Encounter for other screening for malignant neoplasm of breast: Secondary | ICD-10-CM

## 2015-09-21 HISTORY — DX: Malignant (primary) neoplasm, unspecified: C80.1

## 2015-10-20 ENCOUNTER — Other Ambulatory Visit: Payer: Medicare Other

## 2015-10-20 DIAGNOSIS — E785 Hyperlipidemia, unspecified: Secondary | ICD-10-CM

## 2015-10-21 LAB — LIPID PANEL
CHOL/HDL RATIO: 3.5 ratio (ref 0.0–4.4)
Cholesterol, Total: 184 mg/dL (ref 100–199)
HDL: 52 mg/dL (ref >39)
LDL CALC: 116 mg/dL — AB (ref 0–99)
Triglycerides: 79 mg/dL (ref 0–149)
VLDL CHOLESTEROL CAL: 16 mg/dL (ref 5–40)

## 2015-10-22 ENCOUNTER — Ambulatory Visit (INDEPENDENT_AMBULATORY_CARE_PROVIDER_SITE_OTHER): Payer: Medicare Other | Admitting: Family Medicine

## 2015-10-22 ENCOUNTER — Encounter: Payer: Self-pay | Admitting: Family Medicine

## 2015-10-22 VITALS — BP 132/80 | HR 80 | Ht 66.0 in | Wt 143.0 lb

## 2015-10-22 DIAGNOSIS — J40 Bronchitis, not specified as acute or chronic: Secondary | ICD-10-CM

## 2015-10-22 DIAGNOSIS — J0110 Acute frontal sinusitis, unspecified: Secondary | ICD-10-CM | POA: Diagnosis not present

## 2015-10-22 DIAGNOSIS — J301 Allergic rhinitis due to pollen: Secondary | ICD-10-CM | POA: Diagnosis not present

## 2015-10-22 MED ORDER — GUAIFENESIN-CODEINE 100-10 MG/5ML PO SYRP: 5.0000 mL | ORAL_SOLUTION | Freq: Three times a day (TID) | ORAL | Status: DC | PRN

## 2015-10-22 MED ORDER — MONTELUKAST SODIUM 10 MG PO TABS: 10.0000 mg | ORAL_TABLET | Freq: Every day | ORAL | Status: DC

## 2015-10-22 MED ORDER — AMOXICILLIN-POT CLAVULANATE 875-125 MG PO TABS: 1.0000 | ORAL_TABLET | Freq: Two times a day (BID) | ORAL | Status: DC

## 2015-10-22 NOTE — Progress Notes (Signed)
Name: Victoria Rush   MRN: [id number]    DOB: [birth year]   Date:10/22/2015       Progress Note  Subjective  Chief Complaint  Chief Complaint  Patient presents with  . Sinusitis    cough and cong- yellow production    Sinusitis This is a new problem. The current episode started in the past 7 days. The problem has been gradually worsening since onset. There has been no fever. The pain is mild. Associated symptoms include chills, congestion, coughing, headaches, sinus pressure, sneezing, a sore throat and swollen glands. Pertinent negatives include no diaphoresis, ear pain, neck pain or shortness of breath. Past treatments include nothing. The treatment provided mild relief.    No problem-specific assessment & plan notes found for this encounter.   Past Medical History  Diagnosis Date  . GERD (gastroesophageal reflux disease)   . Thyroid disease   . Hyperlipidemia   . Hypertension   . Cancer ([place]) 1981    melanoma    Past Surgical History  Procedure Laterality Date  . Appendectomy    . Skin cancer excision      foot  . Colonoscopy  2008    Dr Sonny Masters- repeat in 10 years    Family History  Problem Relation Age of Onset  . Diabetes Sister   . Diabetes Son   . Breast cancer Cousin     mat cousin    Social History   Social History  . Marital Status: Married    Spouse Name: N/A  . Number of Children: N/A  . Years of Education: N/A   Occupational History  . Not on file.   Social History Main Topics  . Smoking status: Never Smoker   . Smokeless tobacco: Not on file  . Alcohol Use: No  . Drug Use: No  . Sexual Activity: Not Currently   Other Topics Concern  . Not on file   Social History Narrative    Allergies  Allergen Reactions  . Sulfa Antibiotics      Review of Systems  Constitutional: Positive for chills. Negative for fever, weight loss, malaise/fatigue and diaphoresis.  HENT: Positive for congestion, sinus pressure, sneezing and sore throat.  Negative for ear discharge and ear pain.   Eyes: Negative for blurred vision.  Respiratory: Positive for cough. Negative for sputum production, shortness of breath and wheezing.   Cardiovascular: Negative for chest pain, palpitations and leg swelling.  Gastrointestinal: Negative for heartburn, nausea, abdominal pain, diarrhea, constipation, blood in stool and melena.  Genitourinary: Negative for dysuria, urgency, frequency and hematuria.  Musculoskeletal: Negative for myalgias, back pain, joint pain and neck pain.  Skin: Negative for rash.  Neurological: Positive for headaches. Negative for dizziness, tingling, sensory change and focal weakness.  Endo/Heme/Allergies: Negative for environmental allergies and polydipsia. Does not bruise/bleed easily.  Psychiatric/Behavioral: Negative for depression and suicidal ideas. The patient is not nervous/anxious and does not have insomnia.      Objective  Filed Vitals:   10/22/15 1514  BP: 132/80  Pulse: 80  Height: 5\' 6"  (1.676 m)  Weight: 143 lb (64.864 kg)    Physical Exam  Constitutional: She is well-developed, well-nourished, and in no distress. No distress.  HENT:  Head: Normocephalic and atraumatic.  Right Ear: Tympanic membrane, external ear and ear canal normal.  Left Ear: Tympanic membrane, external ear and ear canal normal.  Nose: Nose normal. Right sinus exhibits no maxillary sinus tenderness and no frontal sinus tenderness. Left sinus exhibits no maxillary  sinus tenderness and no frontal sinus tenderness.  Mouth/Throat: Uvula is midline and oropharynx is clear and moist. No posterior oropharyngeal erythema.  Eyes: Conjunctivae and EOM are normal. Pupils are equal, round, and reactive to light. Right eye exhibits no discharge. Left eye exhibits no discharge.  Neck: Normal range of motion. Neck supple. No JVD present. No thyromegaly present.  Cardiovascular: Normal rate, regular rhythm, normal heart sounds and intact distal pulses.   Exam reveals no gallop and no friction rub.   No murmur heard. Pulmonary/Chest: Effort normal and breath sounds normal.  Abdominal: Soft. Bowel sounds are normal. She exhibits no mass. There is no tenderness. There is no guarding.  Musculoskeletal: Normal range of motion. She exhibits no edema.  Lymphadenopathy:    She has no cervical adenopathy.  Neurological: She is alert. She has normal reflexes.  Skin: Skin is warm and dry. She is not diaphoretic.  Psychiatric: Mood and affect normal.  Nursing note and vitals reviewed.     Assessment & Plan  Problem List Items Addressed This Visit    None    Visit Diagnoses    Acute frontal sinusitis, recurrence not specified    -  Primary    Relevant Medications    amoxicillin-clavulanate (AUGMENTIN) 875-125 MG tablet    guaiFENesin-codeine (ROBITUSSIN AC) 100-10 MG/5ML syrup    montelukast (SINGULAIR) 10 MG tablet    Allergic rhinitis due to pollen        Bronchitis        Relevant Medications    amoxicillin-clavulanate (AUGMENTIN) 875-125 MG tablet    guaiFENesin-codeine (ROBITUSSIN AC) 100-10 MG/5ML syrup         Dr. Truong Delcastillo [facility]  10/22/2015

## 2015-12-04 ENCOUNTER — Encounter: Payer: Self-pay | Admitting: Family Medicine

## 2015-12-04 ENCOUNTER — Ambulatory Visit (INDEPENDENT_AMBULATORY_CARE_PROVIDER_SITE_OTHER): Payer: Medicare Other | Admitting: Family Medicine

## 2015-12-04 VITALS — BP 110/64 | HR 76 | Ht 66.0 in | Wt 148.0 lb

## 2015-12-04 DIAGNOSIS — J301 Allergic rhinitis due to pollen: Secondary | ICD-10-CM | POA: Diagnosis not present

## 2015-12-04 DIAGNOSIS — J40 Bronchitis, not specified as acute or chronic: Secondary | ICD-10-CM | POA: Diagnosis not present

## 2015-12-04 DIAGNOSIS — J0100 Acute maxillary sinusitis, unspecified: Secondary | ICD-10-CM

## 2015-12-04 MED ORDER — GUAIFENESIN-CODEINE 100-10 MG/5ML PO SYRP: 5.0000 mL | ORAL_SOLUTION | Freq: Three times a day (TID) | ORAL | Status: DC | PRN

## 2015-12-04 MED ORDER — PREDNISONE 10 MG PO TABS: 10.0000 mg | ORAL_TABLET | Freq: Every day | ORAL | Status: DC

## 2015-12-04 MED ORDER — AMOXICILLIN-POT CLAVULANATE 875-125 MG PO TABS: 1.0000 | ORAL_TABLET | Freq: Two times a day (BID) | ORAL | Status: DC

## 2015-12-04 NOTE — Progress Notes (Signed)
Name: Victoria Rush   MRN: [id number]    DOB: 1946/03/10   Date:12/04/2015       Progress Note  Subjective  Chief Complaint  Chief Complaint  Patient presents with  . Allergic Rhinitis     taking Zyrtec and Singulair every day- doesn't seem to be helping. Having watery eyes, nasal drainage    Sinusitis This is a chronic problem. The current episode started 1 to 4 weeks ago. The problem has been waxing and waning since onset. There has been no fever. Associated symptoms include congestion, coughing and sinus pressure. Pertinent negatives include no chills, diaphoresis, ear pain, headaches, hoarse voice, neck pain, shortness of breath, sneezing, sore throat or swollen glands. Treatments tried: zyrtec and singulair. The treatment provided no relief.  Cough This is a new problem. The current episode started in the past 7 days. The problem has been gradually worsening. The cough is non-productive. Associated symptoms include nasal congestion and postnasal drip. Pertinent negatives include no chest pain, chills, ear pain, fever, headaches, heartburn, hemoptysis, myalgias, rash, sore throat, shortness of breath, weight loss or wheezing. The symptoms are aggravated by pollens. The treatment provided no relief. There is no history of environmental allergies.    No problem-specific assessment & plan notes found for this encounter.   Past Medical History  Diagnosis Date  . GERD (gastroesophageal reflux disease)   . Thyroid disease   . Hyperlipidemia   . Hypertension   . Cancer ([place]) 1981    melanoma    Past Surgical History  Procedure Laterality Date  . Appendectomy    . Skin cancer excision      foot  . Colonoscopy  2008    Dr Sonny Masters- repeat in 10 years    Family History  Problem Relation Age of Onset  . Diabetes Sister   . Diabetes Son   . Breast cancer Cousin     mat cousin    Social History   Social History  . Marital Status: Married    Spouse Name: N/A  . Number of  Children: N/A  . Years of Education: N/A   Occupational History  . Not on file.   Social History Main Topics  . Smoking status: Never Smoker   . Smokeless tobacco: Not on file  . Alcohol Use: No  . Drug Use: No  . Sexual Activity: Not Currently   Other Topics Concern  . Not on file   Social History Narrative    Allergies  Allergen Reactions  . Sulfa Antibiotics      Review of Systems  Constitutional: Negative for fever, chills, weight loss, malaise/fatigue and diaphoresis.  HENT: Positive for congestion, postnasal drip and sinus pressure. Negative for ear discharge, ear pain, hoarse voice, sneezing and sore throat.   Eyes: Negative for blurred vision.  Respiratory: Positive for cough. Negative for hemoptysis, sputum production, shortness of breath and wheezing.   Cardiovascular: Negative for chest pain, palpitations and leg swelling.  Gastrointestinal: Negative for heartburn, nausea, abdominal pain, diarrhea, constipation, blood in stool and melena.  Genitourinary: Negative for dysuria, urgency, frequency and hematuria.  Musculoskeletal: Negative for myalgias, back pain, joint pain and neck pain.  Skin: Negative for rash.  Neurological: Negative for dizziness, tingling, sensory change, focal weakness and headaches.  Endo/Heme/Allergies: Negative for environmental allergies and polydipsia. Does not bruise/bleed easily.  Psychiatric/Behavioral: Negative for depression and suicidal ideas. The patient is not nervous/anxious and does not have insomnia.      Objective  Filed Vitals:  12/04/15 0803  BP: 110/64  Pulse: 76  Height: 5\' 6"  (1.676 m)  Weight: 148 lb (67.132 kg)    Physical Exam  Constitutional: She is well-developed, well-nourished, and in no distress. No distress.  HENT:  Head: Normocephalic and atraumatic.  Right Ear: External ear normal.  Left Ear: External ear normal.  Nose: Nose normal.  Mouth/Throat: Oropharynx is clear and moist.  Eyes:  Conjunctivae and EOM are normal. Pupils are equal, round, and reactive to light. Right eye exhibits no discharge. Left eye exhibits no discharge.  Neck: Normal range of motion. Neck supple. No JVD present. No thyromegaly present.  Cardiovascular: Normal rate, regular rhythm, normal heart sounds and intact distal pulses.  Exam reveals no gallop and no friction rub.   No murmur heard. Pulmonary/Chest: Effort normal and breath sounds normal.  Abdominal: Soft. Bowel sounds are normal. She exhibits no mass. There is no tenderness. There is no guarding.  Musculoskeletal: Normal range of motion. She exhibits no edema.  Lymphadenopathy:    She has no cervical adenopathy.  Neurological: She is alert. She has normal reflexes.  Skin: Skin is warm and dry. She is not diaphoretic.  Psychiatric: Mood and affect normal.  Nursing note and vitals reviewed.     Assessment & Plan  Problem List Items Addressed This Visit      Respiratory   Allergic rhinitis due to pollen   Relevant Medications   predniSONE (DELTASONE) 10 MG tablet    Other Visit Diagnoses    Acute maxillary sinusitis, recurrence not specified    -  Primary    Relevant Medications    amoxicillin-clavulanate (AUGMENTIN) 875-125 MG tablet    guaiFENesin-codeine (ROBITUSSIN AC) 100-10 MG/5ML syrup    predniSONE (DELTASONE) 10 MG tablet    Bronchitis        Relevant Medications    amoxicillin-clavulanate (AUGMENTIN) 875-125 MG tablet    guaiFENesin-codeine (ROBITUSSIN AC) 100-10 MG/5ML syrup         Dr. Deanna Jones [facility]  12/04/2015

## 2015-12-24 DIAGNOSIS — H25013 Cortical age-related cataract, bilateral: Secondary | ICD-10-CM | POA: Diagnosis not present

## 2016-01-13 ENCOUNTER — Encounter: Payer: Self-pay | Admitting: Family Medicine

## 2016-01-13 ENCOUNTER — Ambulatory Visit (INDEPENDENT_AMBULATORY_CARE_PROVIDER_SITE_OTHER): Payer: Medicare Other | Admitting: Family Medicine

## 2016-01-13 VITALS — BP 120/80 | HR 64 | Temp 99.0°F | Ht 66.0 in | Wt 145.0 lb

## 2016-01-13 DIAGNOSIS — J019 Acute sinusitis, unspecified: Secondary | ICD-10-CM

## 2016-01-13 DIAGNOSIS — J301 Allergic rhinitis due to pollen: Secondary | ICD-10-CM | POA: Diagnosis not present

## 2016-01-13 MED ORDER — FLUTICASONE PROPIONATE 50 MCG/ACT NA SUSP: 2.0000 | Freq: Every day | NASAL | Status: DC

## 2016-01-13 MED ORDER — AMOXICILLIN-POT CLAVULANATE 875-125 MG PO TABS: 1.0000 | ORAL_TABLET | Freq: Two times a day (BID) | ORAL | Status: DC

## 2016-01-13 NOTE — Progress Notes (Signed)
Name: Victoria Rush   MRN: [id number]    DOB: [birth year]   Date:01/13/2016       Progress Note  Subjective  Chief Complaint  Chief Complaint  Patient presents with  . Sinusitis    drainage in throat- ears feel stopped up/ sore throat    Sinusitis This is a new problem. The current episode started 1 to 4 weeks ago. The problem has been waxing and waning since onset. There has been no fever. Associated symptoms include congestion, diaphoresis and sinus pressure. Pertinent negatives include no chills, coughing, ear pain, headaches, neck pain, shortness of breath or sore throat. Past treatments include acetaminophen. The treatment provided mild relief.    No problem-specific assessment & plan notes found for this encounter.   Past Medical History  Diagnosis Date  . GERD (gastroesophageal reflux disease)   . Thyroid disease   . Hyperlipidemia   . Hypertension   . Cancer ([place]) 1981    melanoma    Past Surgical History  Procedure Laterality Date  . Appendectomy    . Skin cancer excision      foot  . Colonoscopy  2008    Dr Sonny Masters- repeat in 10 years    Family History  Problem Relation Age of Onset  . Diabetes Sister   . Diabetes Son   . Breast cancer Cousin     mat cousin    Social History   Social History  . Marital Status: Married    Spouse Name: N/A  . Number of Children: N/A  . Years of Education: N/A   Occupational History  . Not on file.   Social History Main Topics  . Smoking status: Never Smoker   . Smokeless tobacco: Not on file  . Alcohol Use: No  . Drug Use: No  . Sexual Activity: Not Currently   Other Topics Concern  . Not on file   Social History Narrative    Allergies  Allergen Reactions  . Sulfa Antibiotics      Review of Systems  Constitutional: Positive for diaphoresis. Negative for fever, chills, weight loss and malaise/fatigue.  HENT: Positive for congestion and sinus pressure. Negative for ear discharge, ear pain and sore throat.    Eyes: Negative for blurred vision.  Respiratory: Negative for cough, sputum production, shortness of breath and wheezing.   Cardiovascular: Negative for chest pain, palpitations and leg swelling.  Gastrointestinal: Negative for heartburn, nausea, abdominal pain, diarrhea, constipation, blood in stool and melena.  Genitourinary: Negative for dysuria, urgency, frequency and hematuria.  Musculoskeletal: Negative for myalgias, back pain, joint pain and neck pain.  Skin: Negative for rash.  Neurological: Negative for dizziness, tingling, sensory change, focal weakness and headaches.  Endo/Heme/Allergies: Negative for environmental allergies and polydipsia. Does not bruise/bleed easily.  Psychiatric/Behavioral: Negative for depression and suicidal ideas. The patient is not nervous/anxious and does not have insomnia.      Objective  Filed Vitals:   01/13/16 0933  BP: 120/80  Pulse: 64  Temp: 99 F (37.2 C)  Height: 5\' 6"  (1.676 m)  Weight: 145 lb (65.772 kg)    Physical Exam  Constitutional: She is well-developed, well-nourished, and in no distress. No distress.  HENT:  Head: Normocephalic and atraumatic.  Right Ear: External ear and ear canal normal. Tympanic membrane is retracted.  Left Ear: External ear and ear canal normal. Tympanic membrane is retracted.  Nose: Nose normal. Right sinus exhibits no maxillary sinus tenderness and no frontal sinus tenderness. Left sinus exhibits  no maxillary sinus tenderness and no frontal sinus tenderness.  Mouth/Throat: Posterior oropharyngeal erythema present.  Eyes: Conjunctivae and EOM are normal. Pupils are equal, round, and reactive to light. Right eye exhibits no discharge. Left eye exhibits no discharge.  Neck: Normal range of motion. Neck supple. No JVD present. No thyromegaly present.  Cardiovascular: Normal rate, regular rhythm, normal heart sounds and intact distal pulses.  Exam reveals no gallop and no friction rub.   No murmur  heard. Pulmonary/Chest: Effort normal and breath sounds normal. She has no wheezes. She has no rales.  Abdominal: Soft. Bowel sounds are normal. She exhibits no mass. There is no tenderness. There is no guarding.  Musculoskeletal: Normal range of motion. She exhibits no edema.  Lymphadenopathy:    She has no cervical adenopathy.  Neurological: She is alert.  Skin: Skin is warm and dry. No rash noted. She is not diaphoretic. No erythema.  Psychiatric: Mood and affect normal.  Nursing note and vitals reviewed.     Assessment & Plan  Problem List Items Addressed This Visit      Respiratory   Allergic rhinitis due to pollen - Primary   Relevant Medications   fluticasone (FLONASE) 50 MCG/ACT nasal spray    Other Visit Diagnoses    Acute sinusitis, recurrence not specified, unspecified location        Relevant Medications    amoxicillin-clavulanate (AUGMENTIN) 875-125 MG tablet    fluticasone (FLONASE) 50 MCG/ACT nasal spray         Dr. Deanna Jones [facility]  01/13/2016

## 2016-02-12 ENCOUNTER — Ambulatory Visit (INDEPENDENT_AMBULATORY_CARE_PROVIDER_SITE_OTHER): Payer: Medicare Other | Admitting: Family Medicine

## 2016-02-12 ENCOUNTER — Encounter: Payer: Self-pay | Admitting: Family Medicine

## 2016-02-12 VITALS — BP 120/80 | HR 60 | Ht 66.0 in | Wt 147.0 lb

## 2016-02-12 DIAGNOSIS — J301 Allergic rhinitis due to pollen: Secondary | ICD-10-CM | POA: Diagnosis not present

## 2016-02-12 DIAGNOSIS — J0110 Acute frontal sinusitis, unspecified: Secondary | ICD-10-CM | POA: Diagnosis not present

## 2016-02-12 DIAGNOSIS — E032 Hypothyroidism due to medicaments and other exogenous substances: Secondary | ICD-10-CM

## 2016-02-12 DIAGNOSIS — L259 Unspecified contact dermatitis, unspecified cause: Secondary | ICD-10-CM | POA: Diagnosis not present

## 2016-02-12 DIAGNOSIS — I10 Essential (primary) hypertension: Secondary | ICD-10-CM

## 2016-02-12 DIAGNOSIS — K219 Gastro-esophageal reflux disease without esophagitis: Secondary | ICD-10-CM | POA: Diagnosis not present

## 2016-02-12 DIAGNOSIS — L508 Other urticaria: Secondary | ICD-10-CM

## 2016-02-12 DIAGNOSIS — L509 Urticaria, unspecified: Secondary | ICD-10-CM

## 2016-02-12 DIAGNOSIS — E785 Hyperlipidemia, unspecified: Secondary | ICD-10-CM | POA: Diagnosis not present

## 2016-02-12 HISTORY — DX: Unspecified contact dermatitis, unspecified cause: L25.9

## 2016-02-12 MED ORDER — FLUTICASONE PROPIONATE 50 MCG/ACT NA SUSP: 2.0000 | Freq: Every day | NASAL | 6 refills | Status: DC

## 2016-02-12 MED ORDER — LEVOTHYROXINE SODIUM 25 MCG PO TABS: ORAL_TABLET | ORAL | 1 refills | Status: DC

## 2016-02-12 MED ORDER — LOSARTAN POTASSIUM 50 MG PO TABS: 50.0000 mg | ORAL_TABLET | Freq: Every day | ORAL | 1 refills | Status: DC

## 2016-02-12 MED ORDER — PANTOPRAZOLE SODIUM 40 MG PO TBEC: 40.0000 mg | DELAYED_RELEASE_TABLET | Freq: Every day | ORAL | 1 refills | Status: DC

## 2016-02-12 MED ORDER — TRIAMCINOLONE ACETONIDE 0.1 % EX CREA: 1.0000 "application " | TOPICAL_CREAM | Freq: Two times a day (BID) | CUTANEOUS | 0 refills | Status: DC

## 2016-02-12 MED ORDER — MONTELUKAST SODIUM 10 MG PO TABS: 10.0000 mg | ORAL_TABLET | Freq: Every day | ORAL | 11 refills | Status: DC

## 2016-02-12 MED ORDER — AMLODIPINE BESYLATE 5 MG PO TABS: 5.0000 mg | ORAL_TABLET | Freq: Every day | ORAL | 1 refills | Status: DC

## 2016-02-12 MED ORDER — CETIRIZINE HCL 10 MG PO TABS: 10.0000 mg | ORAL_TABLET | Freq: Every day | ORAL | 11 refills | Status: DC

## 2016-02-12 NOTE — Progress Notes (Signed)
Name: Victoria Rush   MRN: [id number]    DOB: Jan 17, 1946   Date:02/12/2016       Progress Note  Subjective  Chief Complaint  Chief Complaint  Patient presents with  . Hypertension  . Gastroesophageal Reflux  . Hypothyroidism    Hypertension  This is a chronic problem. The current episode started more than 1 year ago. The problem has been gradually improving since onset. The problem is controlled. Pertinent negatives include no anxiety, blurred vision, chest pain, headaches, malaise/fatigue, neck pain, orthopnea, palpitations, peripheral edema, PND, shortness of breath or sweats. There are no associated agents to hypertension. There are no known risk factors for coronary artery disease. The current treatment provides mild improvement. There are no compliance problems.  Hypertensive end-organ damage includes a thyroid problem. There is no history of angina, kidney disease, CAD/MI, CVA, heart failure, left ventricular hypertrophy, PVD, renovascular disease or retinopathy. There is no history of chronic renal disease or a hypertension causing med.  Gastroesophageal Reflux  She reports no abdominal pain, no belching, no chest pain, no choking, no coughing, no dysphagia, no early satiety, no globus sensation, no heartburn, no hoarse voice, no nausea, no sore throat, no stridor, no tooth decay, no water brash or no wheezing. This is a chronic problem. The current episode started more than 1 year ago. The problem occurs rarely. The problem has been gradually improving. The symptoms are aggravated by certain foods. Pertinent negatives include no anemia, fatigue, melena, muscle weakness, orthopnea or weight loss. She has tried nothing for the symptoms. The treatment provided moderate relief.  Rash  This is a new problem. The current episode started 1 to 4 weeks ago. The problem has been waxing and waning since onset. The affected locations include the left ankle. The rash is characterized by itchiness, dryness  and scaling. She was exposed to nothing. Pertinent negatives include no cough, diarrhea, fatigue, fever, joint pain, shortness of breath or sore throat. Past treatments include antibiotics.  Thyroid Problem  Presents for follow-up visit. Patient reports no anxiety, cold intolerance, constipation, diaphoresis, diarrhea, dry skin, fatigue, hair loss, hoarse voice, palpitations, visual change, weight gain or weight loss. The symptoms have been stable. Her past medical history is significant for hyperlipidemia. There is no history of heart failure.  Hyperlipidemia  This is a chronic problem. The problem is controlled. Recent lipid tests were reviewed and are normal. She has no history of chronic renal disease. Pertinent negatives include no chest pain, focal weakness, myalgias or shortness of breath. There are no compliance problems.     No problem-specific Assessment & Plan notes found for this encounter.   Past Medical History:  Diagnosis Date  . Cancer ([place]) 1981   melanoma  . GERD (gastroesophageal reflux disease)   . Hyperlipidemia   . Hypertension   . Thyroid disease     Past Surgical History:  Procedure Laterality Date  . APPENDECTOMY    . COLONOSCOPY  2008   Dr Sonny Masters- repeat in 10 years  . SKIN CANCER EXCISION     foot    Family History  Problem Relation Age of Onset  . Breast cancer Cousin     mat cousin  . Diabetes Sister   . Diabetes Son     Social History   Social History  . Marital status: Married    Spouse name: N/A  . Number of children: N/A  . Years of education: N/A   Occupational History  . Not on file.  Social History Main Topics  . Smoking status: Never Smoker  . Smokeless tobacco: Not on file  . Alcohol use No  . Drug use: No  . Sexual activity: Not Currently   Other Topics Concern  . Not on file   Social History Narrative  . No narrative on file    Allergies  Allergen Reactions  . Sulfa Antibiotics      Review of Systems   Constitutional: Negative for chills, diaphoresis, fatigue, fever, malaise/fatigue, weight gain and weight loss.  HENT: Negative for ear discharge, ear pain, hoarse voice and sore throat.   Eyes: Negative for blurred vision.  Respiratory: Negative for cough, sputum production, choking, shortness of breath and wheezing.   Cardiovascular: Negative for chest pain, palpitations, orthopnea, leg swelling and PND.  Gastrointestinal: Negative for abdominal pain, blood in stool, constipation, diarrhea, dysphagia, heartburn, melena and nausea.  Genitourinary: Negative for dysuria, frequency, hematuria and urgency.  Musculoskeletal: Negative for back pain, joint pain, myalgias, muscle weakness and neck pain.  Skin: Positive for rash.  Neurological: Negative for dizziness, tingling, sensory change, focal weakness and headaches.  Endo/Heme/Allergies: Negative for environmental allergies, cold intolerance and polydipsia. Does not bruise/bleed easily.  Psychiatric/Behavioral: Negative for depression and suicidal ideas. The patient is not nervous/anxious and does not have insomnia.      Objective  Vitals:   02/12/16 0804  BP: 120/80  Pulse: 60  Weight: 147 lb (66.7 kg)  Height: 5\' 6"  (1.676 m)    Physical Exam  Constitutional: She is well-developed, well-nourished, and in no distress. No distress.  HENT:  Head: Normocephalic and atraumatic.  Right Ear: External ear normal.  Left Ear: External ear normal.  Nose: Nose normal.  Mouth/Throat: Oropharynx is clear and moist.  Eyes: Conjunctivae and EOM are normal. Pupils are equal, round, and reactive to light. Right eye exhibits no discharge. Left eye exhibits no discharge.  Neck: Normal range of motion. Neck supple. No JVD present. No thyromegaly present.  Cardiovascular: Normal rate, regular rhythm, normal heart sounds and intact distal pulses.  Exam reveals no gallop and no friction rub.   No murmur heard. Pulmonary/Chest: Effort normal and  breath sounds normal. She has no wheezes. She has no rales.  Abdominal: Soft. Bowel sounds are normal. She exhibits no mass. There is no tenderness. There is no guarding.  Musculoskeletal: Normal range of motion. She exhibits no edema.  Lymphadenopathy:    She has no cervical adenopathy.  Neurological: She is alert.  Skin: Skin is warm and dry. She is not diaphoretic.  Psychiatric: Mood and affect normal.  Nursing note and vitals reviewed.     Assessment & Plan  Problem List Items Addressed This Visit      Cardiovascular and Mediastinum   Essential hypertension   Relevant Medications   amLODipine (NORVASC) 5 MG tablet   losartan (COZAAR) 50 MG tablet   Other Relevant Orders   Renal Function Panel     Respiratory   Allergic rhinitis due to pollen   Relevant Medications   montelukast (SINGULAIR) 10 MG tablet   fluticasone (FLONASE) 50 MCG/ACT nasal spray   cetirizine (ZYRTEC) 10 MG tablet     Digestive   Esophageal reflux   Relevant Medications   pantoprazole (PROTONIX) 40 MG tablet     Endocrine   Hypothyroidism - Primary   Relevant Medications   levothyroxine (SYNTHROID, LEVOTHROID) 25 MCG tablet   Other Relevant Orders   Thyroid Panel With TSH     Musculoskeletal and Integument  Contact dermatitis   Relevant Medications   triamcinolone cream (KENALOG) 0.1 %     Other   Hyperlipidemia   Relevant Medications   amLODipine (NORVASC) 5 MG tablet   losartan (COZAAR) 50 MG tablet   Other Relevant Orders   Lipid Profile    Other Visit Diagnoses    Acute frontal sinusitis, recurrence not specified       Relevant Medications   montelukast (SINGULAIR) 10 MG tablet   fluticasone (FLONASE) 50 MCG/ACT nasal spray   cetirizine (ZYRTEC) 10 MG tablet   Urticaria of entire body       Relevant Medications   cetirizine (ZYRTEC) 10 MG tablet        Dr. Eusebia Grulke [facility]  02/12/16

## 2016-02-13 LAB — LIPID PANEL
CHOL/HDL RATIO: 4.1 ratio (ref 0.0–4.4)
Cholesterol, Total: 213 mg/dL — ABNORMAL HIGH (ref 100–199)
HDL: 52 mg/dL (ref >39)
LDL CALC: 144 mg/dL — AB (ref 0–99)
Triglycerides: 87 mg/dL (ref 0–149)
VLDL CHOLESTEROL CAL: 17 mg/dL (ref 5–40)

## 2016-02-13 LAB — RENAL FUNCTION PANEL
Albumin: 4.3 g/dL (ref 3.5–4.8)
BUN / CREAT RATIO: 15 (ref 12–28)
BUN: 11 mg/dL (ref 8–27)
CALCIUM: 9.2 mg/dL (ref 8.7–10.3)
CHLORIDE: 100 mmol/L (ref 96–106)
CO2: 24 mmol/L (ref 18–29)
Creatinine, Ser: 0.75 mg/dL (ref 0.57–1.00)
GFR calc Af Amer: 93 mL/min/{1.73_m2} (ref >59)
GFR calc non Af Amer: 81 mL/min/{1.73_m2} (ref >59)
Glucose: 101 mg/dL — ABNORMAL HIGH (ref 65–99)
POTASSIUM: 4.2 mmol/L (ref 3.5–5.2)
Phosphorus: 3.7 mg/dL (ref 2.5–4.5)
SODIUM: 140 mmol/L (ref 134–144)

## 2016-02-13 LAB — THYROID PANEL WITH TSH
Free Thyroxine Index: 1.8 (ref 1.2–4.9)
T3 Uptake Ratio: 25 % (ref 24–39)
T4, Total: 7.3 ug/dL (ref 4.5–12.0)
TSH: 3.07 u[IU]/mL (ref 0.450–4.500)

## 2016-03-30 ENCOUNTER — Ambulatory Visit (INDEPENDENT_AMBULATORY_CARE_PROVIDER_SITE_OTHER): Payer: Medicare Other | Admitting: Family Medicine

## 2016-03-30 ENCOUNTER — Encounter: Payer: Self-pay | Admitting: Family Medicine

## 2016-03-30 VITALS — BP 130/70 | HR 84 | Ht 66.0 in | Wt 146.0 lb

## 2016-03-30 DIAGNOSIS — B353 Tinea pedis: Secondary | ICD-10-CM | POA: Diagnosis not present

## 2016-03-30 DIAGNOSIS — S76111A Strain of right quadriceps muscle, fascia and tendon, initial encounter: Secondary | ICD-10-CM | POA: Diagnosis not present

## 2016-03-30 MED ORDER — ETODOLAC 500 MG PO TABS: 500.0000 mg | ORAL_TABLET | Freq: Two times a day (BID) | ORAL | 1 refills | Status: DC

## 2016-03-30 MED ORDER — CLOTRIMAZOLE-BETAMETHASONE 1-0.05 % EX CREA
1.0000 | TOPICAL_CREAM | Freq: Two times a day (BID) | CUTANEOUS | 0 refills | Status: DC
Start: 2016-03-30 — End: 2017-01-31

## 2016-03-30 NOTE — Progress Notes (Signed)
Name: Victoria Rush   MRN: [id number]    DOB: [birth year]   Date:03/30/2016       Progress Note  Subjective  Chief Complaint  Chief Complaint  Patient presents with  . Rash    was seen in Aug- rash on back of L) foot- was given triamcinolone cream but no antibiotic? Cream hasn't really helped    Rash  This is a recurrent problem. The current episode started 1 to 4 weeks ago. The problem has been gradually worsening since onset. The affected locations include the right ankle. The rash is characterized by itchiness, redness and scaling. Pertinent negatives include no anorexia, congestion, cough, diarrhea, eye pain, facial edema, fatigue, fever, joint pain, nail changes, rhinorrhea, shortness of breath, sore throat or vomiting. Past treatments include topical steroids. The treatment provided moderate relief.  Leg Pain   The incident occurred more than 1 week ago. There was no injury mechanism. The pain is present in the right hip and right leg. The quality of the pain is described as aching. The pain is at a severity of 5/10. The pain is moderate. The pain has been worsening since onset. Pertinent negatives include no inability to bear weight, loss of motion, loss of sensation, muscle weakness, numbness or tingling. Associated symptoms comments: Occasional limp. The symptoms are aggravated by movement. She has tried NSAIDs for the symptoms. The treatment provided mild relief.    No problem-specific Assessment & Plan notes found for this encounter.   Past Medical History:  Diagnosis Date  . Cancer ([place]) 1981   melanoma  . GERD (gastroesophageal reflux disease)   . Hyperlipidemia   . Hypertension   . Thyroid disease     Past Surgical History:  Procedure Laterality Date  . APPENDECTOMY    . COLONOSCOPY  2008   Dr Sonny Masters- repeat in 10 years  . SKIN CANCER EXCISION     foot    Family History  Problem Relation Age of Onset  . Breast cancer Cousin     mat cousin  . Diabetes Sister   .  Diabetes Son     Social History   Social History  . Marital status: Married    Spouse name: N/A  . Number of children: N/A  . Years of education: N/A   Occupational History  . Not on file.   Social History Main Topics  . Smoking status: Never Smoker  . Smokeless tobacco: Not on file  . Alcohol use No  . Drug use: No  . Sexual activity: Not Currently   Other Topics Concern  . Not on file   Social History Narrative  . No narrative on file    Allergies  Allergen Reactions  . Sulfa Antibiotics      Review of Systems  Constitutional: Negative for chills, fatigue, fever, malaise/fatigue and weight loss.  HENT: Negative for congestion, ear discharge, ear pain, rhinorrhea and sore throat.   Eyes: Negative for blurred vision and pain.  Respiratory: Negative for cough, sputum production, shortness of breath and wheezing.   Cardiovascular: Negative for chest pain, palpitations and leg swelling.  Gastrointestinal: Negative for abdominal pain, anorexia, blood in stool, constipation, diarrhea, heartburn, melena, nausea and vomiting.  Genitourinary: Negative for dysuria, frequency, hematuria and urgency.  Musculoskeletal: Negative for back pain, joint pain, myalgias and neck pain.  Skin: Positive for rash. Negative for nail changes.  Neurological: Negative for dizziness, tingling, sensory change, focal weakness, numbness and headaches.  Endo/Heme/Allergies: Negative for environmental allergies and  polydipsia. Does not bruise/bleed easily.  Psychiatric/Behavioral: Negative for depression and suicidal ideas. The patient is not nervous/anxious and does not have insomnia.      Objective  Vitals:   03/30/16 0811  BP: 130/70  Pulse: 84  Weight: 146 lb (66.2 kg)  Height: 5\' 6"  (1.676 m)    Physical Exam  Constitutional: She is well-developed, well-nourished, and in no distress. No distress.  HENT:  Head: Normocephalic and atraumatic.  Right Ear: External ear normal.  Left  Ear: External ear normal.  Nose: Nose normal.  Mouth/Throat: Oropharynx is clear and moist.  Eyes: Conjunctivae and EOM are normal. Pupils are equal, round, and reactive to light. Right eye exhibits no discharge. Left eye exhibits no discharge.  Neck: Normal range of motion. Neck supple. No JVD present. No thyromegaly present.  Cardiovascular: Normal rate, regular rhythm, normal heart sounds and intact distal pulses.  Exam reveals no gallop and no friction rub.   No murmur heard. Pulmonary/Chest: Effort normal and breath sounds normal.  Abdominal: Soft. Bowel sounds are normal. She exhibits no mass. There is no tenderness. There is no guarding.  Musculoskeletal: Normal range of motion. She exhibits no edema.       Right upper leg: She exhibits tenderness.  Tender quadracep  Lymphadenopathy:    She has no cervical adenopathy.  Neurological: She is alert.  Skin: Skin is warm and dry. Rash noted. She is not diaphoretic. There is erythema.  Psychiatric: Mood and affect normal.  Nursing note and vitals reviewed.     Assessment & Plan  Problem List Items Addressed This Visit    None    Visit Diagnoses    Tinea pedis of left foot    -  Primary   Relevant Medications   clotrimazole-betamethasone (LOTRISONE) cream   Quadriceps strain, right, initial encounter       Relevant Medications   etodolac (LODINE) 500 MG tablet     I spent 15 minutes with this patient, More than 50% of that time was spent in face to face education, counseling and care coordination.   Dr. [facility]  03/30/16

## 2016-04-27 ENCOUNTER — Other Ambulatory Visit: Payer: Self-pay | Admitting: Family Medicine

## 2016-04-27 DIAGNOSIS — S76111A Strain of right quadriceps muscle, fascia and tendon, initial encounter: Secondary | ICD-10-CM

## 2016-05-11 ENCOUNTER — Other Ambulatory Visit: Payer: Self-pay | Admitting: Family Medicine

## 2016-06-02 ENCOUNTER — Ambulatory Visit (INDEPENDENT_AMBULATORY_CARE_PROVIDER_SITE_OTHER): Payer: Medicare Other

## 2016-06-02 DIAGNOSIS — Z23 Encounter for immunization: Secondary | ICD-10-CM | POA: Diagnosis not present

## 2016-07-25 ENCOUNTER — Other Ambulatory Visit: Payer: Self-pay

## 2016-07-25 DIAGNOSIS — I10 Essential (primary) hypertension: Secondary | ICD-10-CM

## 2016-07-25 MED ORDER — AMLODIPINE BESYLATE 5 MG PO TABS: 5.0000 mg | ORAL_TABLET | Freq: Every day | ORAL | 0 refills | Status: DC

## 2016-08-15 ENCOUNTER — Ambulatory Visit (INDEPENDENT_AMBULATORY_CARE_PROVIDER_SITE_OTHER): Payer: Medicare Other | Admitting: Family Medicine

## 2016-08-15 VITALS — BP 120/80 | HR 72 | Ht 66.0 in | Wt 142.0 lb

## 2016-08-15 DIAGNOSIS — K219 Gastro-esophageal reflux disease without esophagitis: Secondary | ICD-10-CM

## 2016-08-15 DIAGNOSIS — I10 Essential (primary) hypertension: Secondary | ICD-10-CM | POA: Diagnosis not present

## 2016-08-15 DIAGNOSIS — J301 Allergic rhinitis due to pollen: Secondary | ICD-10-CM

## 2016-08-15 DIAGNOSIS — E782 Mixed hyperlipidemia: Secondary | ICD-10-CM

## 2016-08-15 DIAGNOSIS — E032 Hypothyroidism due to medicaments and other exogenous substances: Secondary | ICD-10-CM | POA: Diagnosis not present

## 2016-08-15 DIAGNOSIS — Z23 Encounter for immunization: Secondary | ICD-10-CM | POA: Diagnosis not present

## 2016-08-15 DIAGNOSIS — Z1231 Encounter for screening mammogram for malignant neoplasm of breast: Secondary | ICD-10-CM

## 2016-08-15 DIAGNOSIS — S76111A Strain of right quadriceps muscle, fascia and tendon, initial encounter: Secondary | ICD-10-CM

## 2016-08-15 DIAGNOSIS — Z1239 Encounter for other screening for malignant neoplasm of breast: Secondary | ICD-10-CM

## 2016-08-15 MED ORDER — ETODOLAC 500 MG PO TABS: 500.0000 mg | ORAL_TABLET | Freq: Two times a day (BID) | ORAL | 11 refills | Status: DC

## 2016-08-15 MED ORDER — CETIRIZINE HCL 10 MG PO TABS: 10.0000 mg | ORAL_TABLET | Freq: Every day | ORAL | 11 refills | Status: AC

## 2016-08-15 MED ORDER — FLUTICASONE PROPIONATE 50 MCG/ACT NA SUSP: 2.0000 | Freq: Every day | NASAL | 6 refills | Status: DC

## 2016-08-15 MED ORDER — MONTELUKAST SODIUM 10 MG PO TABS: 10.0000 mg | ORAL_TABLET | Freq: Every day | ORAL | 11 refills | Status: DC

## 2016-08-15 MED ORDER — PANTOPRAZOLE SODIUM 40 MG PO TBEC: 40.0000 mg | DELAYED_RELEASE_TABLET | Freq: Every day | ORAL | 1 refills | Status: DC

## 2016-08-15 MED ORDER — LOSARTAN POTASSIUM 50 MG PO TABS: 50.0000 mg | ORAL_TABLET | Freq: Every day | ORAL | 1 refills | Status: DC

## 2016-08-15 MED ORDER — LEVOTHYROXINE SODIUM 25 MCG PO TABS: ORAL_TABLET | ORAL | 1 refills | Status: DC

## 2016-08-15 MED ORDER — AMLODIPINE BESYLATE 5 MG PO TABS: 5.0000 mg | ORAL_TABLET | Freq: Every day | ORAL | 1 refills | Status: DC

## 2016-08-15 NOTE — Addendum Note (Signed)
Addended by: Fredderick Severance on: 08/15/2016 04:25 PM   Modules accepted: Orders

## 2016-08-15 NOTE — Progress Notes (Signed)
Name: Victoria Rush   MRN: [id number]    DOB: Nov 11, 1945   Date:08/15/2016       Progress Note  Subjective  Chief Complaint  Chief Complaint  Patient presents with  . Hypothyroidism  . Hypertension  . Gastroesophageal Reflux  . mammogram needed    early am and no Thurs    Hypertension  This is a chronic problem. The current episode started more than 1 year ago. The problem has been gradually improving since onset. The problem is controlled. Pertinent negatives include no anxiety, blurred vision, chest pain, headaches, malaise/fatigue, neck pain, orthopnea, palpitations, peripheral edema, PND, shortness of breath or sweats. There are no associated agents to hypertension. There are no known risk factors for coronary artery disease. Past treatments include angiotensin blockers and calcium channel blockers. The current treatment provides moderate improvement. There are no compliance problems.  There is no history of angina, kidney disease, CAD/MI, CVA, heart failure, left ventricular hypertrophy, PVD, renovascular disease or retinopathy. Identifiable causes of hypertension include a thyroid problem. There is no history of chronic renal disease or a hypertension causing med.  Gastroesophageal Reflux  She reports no abdominal pain, no belching, no chest pain, no choking, no coughing, no dysphagia, no early satiety, no globus sensation, no heartburn, no hoarse voice, no nausea, no sore throat, no stridor, no tooth decay, no water brash or no wheezing. The problem has been waxing and waning. The symptoms are aggravated by certain foods. Pertinent negatives include no anemia, fatigue, melena, muscle weakness or weight loss. She has tried a PPI for the symptoms. The treatment provided moderate relief.  Thyroid Problem  Presents for follow-up visit. Patient reports no anxiety, cold intolerance, constipation, depressed mood, diaphoresis, diarrhea, dry skin, fatigue, hair loss, heat intolerance, hoarse voice,  leg swelling, nail problem, palpitations, tremors, visual change, weight gain or weight loss. (Dry mouth) The symptoms have been stable. Her past medical history is significant for hyperlipidemia. There is no history of diabetes or heart failure.  URI   This is a chronic problem. The current episode started more than 1 year ago. The problem has been waxing and waning. There has been no fever. Associated symptoms include congestion, a plugged ear sensation and rhinorrhea. Pertinent negatives include no abdominal pain, chest pain, coughing, diarrhea, dysuria, ear pain, headaches, joint pain, nausea, neck pain, rash, sinus pain, sore throat or wheezing. She has tried antihistamine (nasal steroid/ singulair) for the symptoms. The treatment provided moderate relief.  Hyperlipidemia  This is a chronic problem. The problem is controlled. Recent lipid tests were reviewed and are normal. Exacerbating diseases include hypothyroidism. She has no history of chronic renal disease, diabetes, liver disease, obesity or nephrotic syndrome. Pertinent negatives include no chest pain, focal weakness, myalgias or shortness of breath. Current antihyperlipidemic treatment includes diet change. The current treatment provides mild improvement of lipids. There are no compliance problems.     No problem-specific Assessment & Plan notes found for this encounter.   Past Medical History:  Diagnosis Date  . Cancer ([place]) 1981   melanoma  . GERD (gastroesophageal reflux disease)   . Hyperlipidemia   . Hypertension   . Thyroid disease     Past Surgical History:  Procedure Laterality Date  . APPENDECTOMY    . COLONOSCOPY  2008   Dr Sonny Masters- repeat in 10 years  . SKIN CANCER EXCISION     foot    Family History  Problem Relation Age of Onset  . Breast cancer Cousin  mat cousin  . Diabetes Sister   . Diabetes Son     Social History   Social History  . Marital status: Married    Spouse name: N/A  . Number of  children: N/A  . Years of education: N/A   Occupational History  . Not on file.   Social History Main Topics  . Smoking status: Never Smoker  . Smokeless tobacco: Not on file  . Alcohol use No  . Drug use: No  . Sexual activity: Not Currently   Other Topics Concern  . Not on file   Social History Narrative  . No narrative on file    Allergies  Allergen Reactions  . Sulfa Antibiotics      Review of Systems  Constitutional: Negative for chills, diaphoresis, fatigue, fever, malaise/fatigue, weight gain and weight loss.  HENT: Positive for congestion and rhinorrhea. Negative for ear discharge, ear pain, hoarse voice, sinus pain and sore throat.   Eyes: Negative for blurred vision, pain, discharge and redness.  Respiratory: Negative for cough, sputum production, choking, shortness of breath, wheezing and stridor.   Cardiovascular: Negative for chest pain, palpitations, orthopnea, leg swelling and PND.  Gastrointestinal: Negative for abdominal pain, blood in stool, constipation, diarrhea, dysphagia, heartburn, melena and nausea.  Genitourinary: Negative for dysuria, frequency, hematuria and urgency.  Musculoskeletal: Negative for back pain, joint pain, myalgias, muscle weakness and neck pain.  Skin: Negative for rash.  Neurological: Negative for dizziness, tingling, tremors, sensory change, focal weakness and headaches.  Endo/Heme/Allergies: Negative for environmental allergies, cold intolerance, heat intolerance and polydipsia. Does not bruise/bleed easily.  Psychiatric/Behavioral: Negative for depression and suicidal ideas. The patient is not nervous/anxious and does not have insomnia.      Objective  Vitals:   08/15/16 0838  BP: 120/80  Pulse: 72  Weight: 142 lb (64.4 kg)  Height: 5\' 6"  (1.676 m)    Physical Exam  Constitutional: She is well-developed, well-nourished, and in no distress. No distress.  HENT:  Head: Normocephalic and atraumatic.  Right Ear: External  ear normal.  Left Ear: External ear normal.  Nose: Nose normal.  Mouth/Throat: Oropharynx is clear and moist.  Eyes: Conjunctivae and EOM are normal. Pupils are equal, round, and reactive to light. Right eye exhibits no discharge. Left eye exhibits no discharge.  Neck: Normal range of motion. Neck supple. No JVD present. No thyromegaly present.  Cardiovascular: Normal rate, regular rhythm, normal heart sounds and intact distal pulses.  Exam reveals no gallop and no friction rub.   No murmur heard. Pulmonary/Chest: Effort normal and breath sounds normal. No stridor. No respiratory distress. She has no wheezes. She has no rales. She exhibits no tenderness.  Abdominal: Soft. Bowel sounds are normal. She exhibits no distension and no mass. There is no tenderness. There is no rebound and no guarding.  Musculoskeletal: Normal range of motion. She exhibits no edema.  Lymphadenopathy:    She has no cervical adenopathy.  Neurological: She is alert. She has normal reflexes.  Skin: Skin is warm and dry. She is not diaphoretic.  Psychiatric: Mood and affect normal.      Assessment & Plan  Problem List Items Addressed This Visit      Cardiovascular and Mediastinum   Essential hypertension - Primary   Relevant Medications   losartan (COZAAR) 50 MG tablet   amLODipine (NORVASC) 5 MG tablet   Other Relevant Orders   Renal Function Panel     Respiratory   Allergic rhinitis due to pollen  Relevant Medications   montelukast (SINGULAIR) 10 MG tablet   cetirizine (ZYRTEC) 10 MG tablet   fluticasone (FLONASE) 50 MCG/ACT nasal spray     Digestive   Esophageal reflux   Relevant Medications   pantoprazole (PROTONIX) 40 MG tablet     Endocrine   Hypothyroidism   Relevant Medications   levothyroxine (SYNTHROID, LEVOTHROID) 25 MCG tablet   Other Relevant Orders   Lipid panel   TSH     Other   Hyperlipidemia   Relevant Medications   losartan (COZAAR) 50 MG tablet   amLODipine (NORVASC) 5  MG tablet   Other Relevant Orders   Lipid panel    Other Visit Diagnoses    Quadriceps strain, right, initial encounter       Relevant Medications   etodolac (LODINE) 500 MG tablet   Need for Tdap vaccination       Relevant Orders   Tdap vaccine greater than or equal to 7yo IM (Completed)        Dr. Deanna [facility]  08/15/16

## 2016-08-16 LAB — LIPID PANEL
CHOL/HDL RATIO: 3.7 ratio (ref 0.0–4.4)
Cholesterol, Total: 190 mg/dL (ref 100–199)
HDL: 52 mg/dL (ref >39)
LDL CALC: 118 mg/dL — AB (ref 0–99)
TRIGLYCERIDES: 102 mg/dL (ref 0–149)
VLDL Cholesterol Cal: 20 mg/dL (ref 5–40)

## 2016-08-16 LAB — RENAL FUNCTION PANEL
ALBUMIN: 4.8 g/dL (ref 3.5–4.8)
BUN/Creatinine Ratio: 12 (ref 12–28)
BUN: 9 mg/dL (ref 8–27)
CO2: 26 mmol/L (ref 18–29)
CREATININE: 0.78 mg/dL (ref 0.57–1.00)
Calcium: 9.5 mg/dL (ref 8.7–10.3)
Chloride: 101 mmol/L (ref 96–106)
GFR, EST AFRICAN AMERICAN: 88 mL/min/{1.73_m2} (ref >59)
GFR, EST NON AFRICAN AMERICAN: 77 mL/min/{1.73_m2} (ref >59)
Glucose: 103 mg/dL — ABNORMAL HIGH (ref 65–99)
PHOSPHORUS: 3.3 mg/dL (ref 2.5–4.5)
Potassium: 4.6 mmol/L (ref 3.5–5.2)
SODIUM: 143 mmol/L (ref 134–144)

## 2016-08-16 LAB — TSH: TSH: 2.91 u[IU]/mL (ref 0.450–4.500)

## 2016-08-22 ENCOUNTER — Ambulatory Visit (INDEPENDENT_AMBULATORY_CARE_PROVIDER_SITE_OTHER): Payer: Medicare Other | Admitting: Family Medicine

## 2016-08-22 VITALS — BP 120/70 | HR 72 | Ht 66.0 in | Wt 144.0 lb

## 2016-08-22 DIAGNOSIS — Z1211 Encounter for screening for malignant neoplasm of colon: Secondary | ICD-10-CM | POA: Diagnosis not present

## 2016-08-22 DIAGNOSIS — Z0000 Encounter for general adult medical examination without abnormal findings: Secondary | ICD-10-CM | POA: Diagnosis not present

## 2016-08-22 LAB — HEMOCCULT GUIAC POC 1CARD (OFFICE): Fecal Occult Blood, POC: NEGATIVE

## 2016-08-22 LAB — HM COLONOSCOPY

## 2016-08-22 NOTE — Progress Notes (Signed)
Patient: Victoria Rush, Female    DOB: 1945-11-25, 71 y.o.   MRN: [id number] Visit Date: 08/22/2016  Today's Provider: Otilio Miu, MD   Chief Complaint  Patient presents with  . Medicare Wellness   Subjective:   Initial preventative physical exam Yoshino Jutras is a 71 y.o. female who presents today for her Initial Preventative Physical Exam. She feels well. She reports exercising walking. She reports she is sleeping well.  Patient presents for annual medicare wellness.  Taking annuall allergy regimen.  Patient discussed colonscopy.     Review of Systems  Constitutional: Negative for chills, diaphoresis, fatigue and fever.  HENT: Positive for congestion and sinus pressure. Negative for drooling, ear discharge, ear pain, mouth sores, nosebleeds, postnasal drip, rhinorrhea and sore throat.   Respiratory: Negative for cough, shortness of breath and wheezing.   Cardiovascular: Negative for chest pain, palpitations and leg swelling.  Gastrointestinal: Negative for abdominal pain, anal bleeding, blood in stool, constipation, diarrhea, nausea and rectal pain.  Endocrine: Negative for polydipsia.  Genitourinary: Negative for difficulty urinating, dysuria, enuresis, frequency, hematuria, menstrual problem, urgency, vaginal bleeding and vaginal discharge.  Musculoskeletal: Positive for arthralgias. Negative for back pain, myalgias and neck pain.  Skin: Negative for rash.  Allergic/Immunologic: Negative for environmental allergies.  Neurological: Negative for dizziness and headaches.  Hematological: Negative for adenopathy. Does not bruise/bleed easily.  Psychiatric/Behavioral: Negative for suicidal ideas. The patient is not nervous/anxious.     Social History   Social History  . Marital status: Married    Spouse name: N/A  . Number of children: N/A  . Years of education: N/A   Occupational History  . Not on file.   Social History Main Topics  . Smoking status: Never Smoker  .  Smokeless tobacco: Not on file  . Alcohol use No  . Drug use: No  . Sexual activity: Not Currently   Other Topics Concern  . Not on file   Social History Narrative  . No narrative on file    Patient Active Problem List   Diagnosis Date Noted  . Contact dermatitis 02/12/2016  . Esophageal reflux 02/12/2016  . Essential hypertension 02/12/2016  . Allergic rhinitis due to pollen 12/04/2015  . 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 Hyperlipidemia 01/30/2015    Past Surgical History:  Procedure Laterality Date  . APPENDECTOMY    . COLONOSCOPY  2008   Dr Sonny Masters- repeat in 10 years  . SKIN CANCER EXCISION     foot    Her family history includes Breast cancer in her cousin; Diabetes in her sister and son.     Previous Medications   AMLODIPINE (NORVASC) 5 MG TABLET    Take 1 tablet (5 mg total) by mouth daily.   ASPIRIN 81 MG TABLET    Take 81 mg by mouth daily.   CETIRIZINE (ZYRTEC) 10 MG TABLET    Take 1 tablet (10 mg total) by mouth daily.   CLOTRIMAZOLE-BETAMETHASONE (LOTRISONE) CREAM    Apply 1 application topically 2 (two) times daily.   ETODOLAC (LODINE) 500 MG TABLET    Take 1 tablet (500 mg total) by mouth 2 (two) times daily.   FLUTICASONE (FLONASE) 50 MCG/ACT NASAL SPRAY    Place 2 sprays into both nostrils daily.   LEVOTHYROXINE (SYNTHROID, LEVOTHROID) 25 MCG TABLET    TAKE ONE TABLET BY MOUTH ONCE DAILY BEFORE BREAKFAST   LOSARTAN (COZAAR) 50 MG TABLET    Take 1 tablet (50 mg total) by mouth daily.   MONTELUKAST (  SINGULAIR) 10 MG TABLET    Take 1 tablet (10 mg total) by mouth at bedtime.   PANTOPRAZOLE (PROTONIX) 40 MG TABLET    Take 1 tablet (40 mg total) by mouth daily.   TRIAMCINOLONE CREAM (KENALOG) 0.1 %    Apply 1 application topically 2 (two) times daily.    Patient Care Team: Juline Patch, MD as PCP - General (Family Medicine)      Objective:   Vitals: BP 120/70   Pulse 72   Ht 5\' 6"  (1.676 m)   Wt 144 lb (65.3 kg)   BMI 23.24 kg/m   Physical Exam   Constitutional: She is oriented to person, place, and time. She appears well-developed and well-nourished.  HENT:  Head: Normocephalic.  Right Ear: External ear normal.  Left Ear: External ear normal.  Nose: Mucosal edema present.  Mouth/Throat: No oropharyngeal exudate, posterior oropharyngeal edema or posterior oropharyngeal erythema.  Eyes: Conjunctivae and EOM are normal. Pupils are equal, round, and reactive to light.  Fundoscopic exam:      The right eye shows no arteriolar narrowing and no AV nicking.       The left eye shows no arteriolar narrowing and no AV nicking.  Neck: Normal range of motion. Neck supple.  Cardiovascular: Normal rate, regular rhythm, normal heart sounds and intact distal pulses.   Pulmonary/Chest: Effort normal and breath sounds normal. She has no wheezes. She has no rales.  Abdominal: Soft. Bowel sounds are normal. She exhibits no distension and no mass. There is no tenderness. There is no rebound and no guarding.  Genitourinary: Rectal exam shows external hemorrhoid.  Musculoskeletal: Normal range of motion.  Lymphadenopathy:       Head (right side): No submental and no submandibular adenopathy present.       Head (left side): No submental and no submandibular adenopathy present.  Neurological: She is alert and oriented to person, place, and time. She has normal reflexes.  Skin: Skin is warm and dry.  Psychiatric: She has a normal mood and affect. Her behavior is normal. Judgment and thought content normal.  Nursing note and vitals reviewed.    No exam data present  Activities of Daily Living In your present state of health, do you have any difficulty performing the following activities: 08/22/2016 01/13/2016  Hearing? Y N  Vision? N N  Difficulty concentrating or making decisions? N N  Walking or climbing stairs? N N  Dressing or bathing? N N  Doing errands, shopping? N N  Preparing Food and eating ? N -  Using the Toilet? N -  In the past six  months, have you accidently leaked urine? N -  Do you have problems with loss of bowel control? N -  Managing your Medications? N -  Managing your Finances? N -  Housekeeping or managing your Housekeeping? N -  Some recent data might be hidden    Fall Risk Assessment Fall Risk  08/22/2016 01/13/2016 10/22/2015 08/14/2015 08/03/2015  Falls in the past year? No No No No No     Patient reports there are not safety devices in place in shower at home.   Depression Screen PHQ 2/9 Scores 08/22/2016 01/13/2016 10/22/2015 08/14/2015  PHQ - 2 Score 0 0 0 0    Cognitive Testing - 6-CIT   Correct? Score   What year is it? yes 0 Yes = 0    No = 4  What month is it? yes 0 Yes = 0  No = 3  Remember:     Pia Mau, [address], [place]     What time is it? yes 0 Yes = 0    No = 3  Count backwards from 20 to 1 yes 0 Correct = 0    1 error = 2   More than 1 error = 4  Say the months of the year in reverse. yes 0 Correct = 0    1 error = 2   More than 1 error = 4  What address did I ask you to remember? yes 0 Correct = 0  1 error = 2    2 error = 4    3 error = 6    4 error = 8    All wrong = 10       TOTAL SCORE  0/28   Interpretation:  Normal  Normal (0-7) Abnormal (8-28)     Assessment & Plan:     Initial Preventative Physical Exam  Reviewed patient's Family Medical History Reviewed and updated list of patient's medical providers Assessment of cognitive impairment was done Assessed patient's functional ability Established a written schedule for health screening [facility] Completed and Reviewed  Exercise Activities and Dietary recommendations Goals    None      Immunization History  Administered Date(s) Administered  . Influenza,inj,Quad PF,36+ Mos 05/11/2015, 06/02/2016  . Pneumococcal Conjugate-13 08/14/2015  . Tdap 08/15/2016    Health Maintenance  Topic Date Due  . PNA vac Low Risk Adult (2 of 2 - PPSV23) 08/13/2016  . MAMMOGRAM  09/20/2017  .  TETANUS/TDAP  08/15/2026  . COLONOSCOPY  08/22/2026  . INFLUENZA VACCINE  Completed  . DEXA SCAN  Completed  . Hepatitis C Screening  Completed     Discussed health benefits of physical activity, and encouraged her to engage in regular exercise appropriate for her age and condition.    ------------------------------------------------------------------------------------------------------------  Problem List Items Addressed This Visit    None    Visit Diagnoses    Medicare annual wellness visit, subsequent    -  Primary   Colon cancer screening       Relevant Orders   POCT occult blood stool (Completed)       Otilio Miu, MD [facility]  08/22/2016

## 2016-09-21 ENCOUNTER — Ambulatory Visit
Admission: RE | Admit: 2016-09-21 | Discharge: 2016-09-21 | Disposition: A | Discharge status: Home / Self Care | Payer: Medicare Other | Source: Ambulatory Visit | Attending: Family Medicine | Admitting: Family Medicine

## 2016-09-21 DIAGNOSIS — Z1231 Encounter for screening mammogram for malignant neoplasm of breast: Secondary | ICD-10-CM | POA: Insufficient documentation

## 2016-09-21 DIAGNOSIS — Z1239 Encounter for other screening for malignant neoplasm of breast: Secondary | ICD-10-CM

## 2016-10-14 DIAGNOSIS — I493 Ventricular premature depolarization: Secondary | ICD-10-CM | POA: Diagnosis not present

## 2016-10-14 DIAGNOSIS — I10 Essential (primary) hypertension: Secondary | ICD-10-CM | POA: Diagnosis not present

## 2016-10-14 DIAGNOSIS — R42 Dizziness and giddiness: Secondary | ICD-10-CM | POA: Diagnosis not present

## 2016-10-14 DIAGNOSIS — H8110 Benign paroxysmal vertigo, unspecified ear: Secondary | ICD-10-CM | POA: Diagnosis not present

## 2016-10-14 DIAGNOSIS — Z882 Allergy status to sulfonamides status: Secondary | ICD-10-CM | POA: Diagnosis not present

## 2016-10-21 ENCOUNTER — Ambulatory Visit (INDEPENDENT_AMBULATORY_CARE_PROVIDER_SITE_OTHER): Payer: Medicare Other | Admitting: Family Medicine

## 2016-10-21 VITALS — BP 120/80 | HR 76 | Ht 66.0 in | Wt 141.0 lb

## 2016-10-21 DIAGNOSIS — H8110 Benign paroxysmal vertigo, unspecified ear: Secondary | ICD-10-CM | POA: Diagnosis not present

## 2016-10-21 NOTE — Progress Notes (Signed)
Name: Victoria Rush   MRN: [id number]    DOB: [birth year]   Date:10/21/2016       Progress Note  Subjective  Chief Complaint  Chief Complaint  Patient presents with  . Follow-up    ER visit- d/c on 10/14/16- vertigo    Dizziness  This is a new problem. The current episode started in the past 7 days. The problem occurs intermittently. The problem has been gradually improving. Associated symptoms include nausea and vertigo. Pertinent negatives include no abdominal pain, anorexia, arthralgias, chest pain, chills, congestion, coughing, fever, headaches, myalgias, neck pain, numbness, rash, sore throat, swollen glands, vomiting or weakness. Treatments tried: meclizine. The treatment provided moderate relief.    No problem-specific Assessment & Plan notes found for this encounter.   Past Medical History:  Diagnosis Date  . Cancer ([place]) 1981   melanoma  . GERD (gastroesophageal reflux disease)   . Hyperlipidemia   . Hypertension   . Thyroid disease     Past Surgical History:  Procedure Laterality Date  . APPENDECTOMY    . COLONOSCOPY  2008   Dr Sonny Masters- repeat in 10 years  . SKIN CANCER EXCISION     foot    Family History  Problem Relation Age of Onset  . Breast cancer Cousin     mat cousin  . Diabetes Sister   . Diabetes Son     Social History   Social History  . Marital status: Married    Spouse name: N/A  . Number of children: N/A  . Years of education: N/A   Occupational History  . Not on file.   Social History Main Topics  . Smoking status: Never Smoker  . Smokeless tobacco: Not on file  . Alcohol use No  . Drug use: No  . Sexual activity: Not Currently   Other Topics Concern  . Not on file   Social History Narrative  . No narrative on file    Allergies  Allergen Reactions  . Sulfa Antibiotics     Outpatient Medications Prior to Visit  Medication Sig Dispense Refill  . amLODipine (NORVASC) 5 MG tablet Take 1 tablet (5 mg total) by mouth daily. 90  tablet 1  . aspirin 81 MG tablet Take 81 mg by mouth daily.    . cetirizine (ZYRTEC) 10 MG tablet Take 1 tablet (10 mg total) by mouth daily. 30 tablet 11  . clotrimazole-betamethasone (LOTRISONE) cream Apply 1 application topically 2 (two) times daily. 30 g 0  . etodolac (LODINE) 500 MG tablet Take 1 tablet (500 mg total) by mouth 2 (two) times daily. 60 tablet 11  . fluticasone (FLONASE) 50 MCG/ACT nasal spray Place 2 sprays into both nostrils daily. 16 g 6  . levothyroxine (SYNTHROID, LEVOTHROID) 25 MCG tablet TAKE ONE TABLET BY MOUTH ONCE DAILY BEFORE BREAKFAST 90 tablet 1  . losartan (COZAAR) 50 MG tablet Take 1 tablet (50 mg total) by mouth daily. 90 tablet 1  . montelukast (SINGULAIR) 10 MG tablet Take 1 tablet (10 mg total) by mouth at bedtime. 30 tablet 11  . pantoprazole (PROTONIX) 40 MG tablet Take 1 tablet (40 mg total) by mouth daily. 90 tablet 1  . triamcinolone cream (KENALOG) 0.1 % Apply 1 application topically 2 (two) times daily. 30 g 0   No facility-administered medications prior to visit.     Review of Systems  Constitutional: Negative for chills, fever, malaise/fatigue and weight loss.  HENT: Negative for congestion, ear discharge, ear pain and sore  throat.   Eyes: Negative for blurred vision.  Respiratory: Negative for cough, sputum production, shortness of breath and wheezing.   Cardiovascular: Negative for chest pain, palpitations and leg swelling.  Gastrointestinal: Positive for nausea. Negative for abdominal pain, anorexia, blood in stool, constipation, diarrhea, heartburn, melena and vomiting.  Genitourinary: Negative for dysuria, frequency, hematuria and urgency.  Musculoskeletal: Negative for arthralgias, back pain, joint pain, myalgias and neck pain.  Skin: Negative for rash.  Neurological: Positive for dizziness and vertigo. Negative for tingling, sensory change, focal weakness, weakness, numbness and headaches.  Endo/Heme/Allergies: Negative for environmental  allergies and polydipsia. Does not bruise/bleed easily.  Psychiatric/Behavioral: Negative for depression and suicidal ideas. The patient is not nervous/anxious and does not have insomnia.      Objective  Vitals:   10/21/16 1414  BP: 120/80  Pulse: 76  Weight: 141 lb (64 kg)  Height: 5\' 6"  (1.676 m)    Physical Exam  Constitutional: She is well-developed, well-nourished, and in no distress. No distress.  HENT:  Head: Normocephalic and atraumatic.  Right Ear: Tympanic membrane, external ear and ear canal normal.  Left Ear: Tympanic membrane, external ear and ear canal normal.  Nose: Nose normal.  Mouth/Throat: Oropharynx is clear and moist.  Eyes: Conjunctivae and EOM are normal. Pupils are equal, round, and reactive to light. Right eye exhibits no discharge. Left eye exhibits no discharge.  Neck: Normal range of motion. Neck supple. No JVD present. No thyromegaly present.  Cardiovascular: Normal rate, regular rhythm, normal heart sounds and intact distal pulses.  Exam reveals no gallop and no friction rub.   No murmur heard. Pulmonary/Chest: Effort normal and breath sounds normal. She has no wheezes. She has no rales.  Abdominal: Soft. Bowel sounds are normal. She exhibits no mass. There is no tenderness. There is no guarding.  Musculoskeletal: Normal range of motion. She exhibits no edema.  Lymphadenopathy:    She has no cervical adenopathy.  Neurological: She is alert. She has normal reflexes.  Skin: Skin is warm and dry. She is not diaphoretic.  Psychiatric: Mood and affect normal.  Nursing note and vitals reviewed.     Assessment & Plan  Problem List Items Addressed This Visit    None    Visit Diagnoses    Benign paroxysmal positional vertigo, unspecified laterality    -  Primary      No orders of the defined types were placed in this encounter.     Dr. Mac[facility]  10/21/16

## 2016-10-21 NOTE — Patient Instructions (Signed)

## 2017-01-20 ENCOUNTER — Other Ambulatory Visit: Payer: Self-pay | Admitting: Family Medicine

## 2017-01-20 DIAGNOSIS — B86 Scabies: Secondary | ICD-10-CM

## 2017-01-31 ENCOUNTER — Encounter: Payer: Self-pay | Admitting: Family Medicine

## 2017-01-31 ENCOUNTER — Ambulatory Visit (INDEPENDENT_AMBULATORY_CARE_PROVIDER_SITE_OTHER): Payer: Medicare Other | Admitting: Family Medicine

## 2017-01-31 VITALS — BP 138/82 | HR 80 | Ht 66.0 in | Wt 140.0 lb

## 2017-01-31 DIAGNOSIS — K219 Gastro-esophageal reflux disease without esophagitis: Secondary | ICD-10-CM

## 2017-01-31 DIAGNOSIS — E032 Hypothyroidism due to medicaments and other exogenous substances: Secondary | ICD-10-CM | POA: Diagnosis not present

## 2017-01-31 DIAGNOSIS — Z23 Encounter for immunization: Secondary | ICD-10-CM

## 2017-01-31 DIAGNOSIS — I10 Essential (primary) hypertension: Secondary | ICD-10-CM | POA: Diagnosis not present

## 2017-01-31 DIAGNOSIS — W57XXXA Bitten or stung by nonvenomous insect and other nonvenomous arthropods, initial encounter: Secondary | ICD-10-CM

## 2017-01-31 MED ORDER — PANTOPRAZOLE SODIUM 40 MG PO TBEC: 40.0000 mg | DELAYED_RELEASE_TABLET | Freq: Every day | ORAL | 1 refills | Status: DC

## 2017-01-31 MED ORDER — LOSARTAN POTASSIUM 50 MG PO TABS: 50.0000 mg | ORAL_TABLET | Freq: Every day | ORAL | 1 refills | Status: DC

## 2017-01-31 MED ORDER — LEVOTHYROXINE SODIUM 25 MCG PO TABS: ORAL_TABLET | ORAL | 1 refills | Status: DC

## 2017-01-31 MED ORDER — AMLODIPINE BESYLATE 5 MG PO TABS: 5.0000 mg | ORAL_TABLET | Freq: Every day | ORAL | 1 refills | Status: DC

## 2017-01-31 NOTE — Progress Notes (Signed)
Name: Victoria Rush   MRN: [id number]    DOB: [birth year]   Date:01/31/2017       Progress Note  Subjective  Chief Complaint  Chief Complaint  Patient presents with  . Gastroesophageal Reflux  . Hypothyroidism  . Hypertension  . Rash    needs refill on elimite cream    Gastroesophageal Reflux  She reports no abdominal pain, no belching, no chest pain, no choking, no coughing, no dysphagia, no early satiety, no heartburn, no hoarse voice, no nausea, no sore throat or no wheezing. The current episode started more than 1 year ago. The problem occurs occasionally. The problem has been waxing and waning. The symptoms are aggravated by certain foods. Pertinent negatives include no anemia, fatigue, melena, muscle weakness, orthopnea or weight loss. The treatment provided moderate relief.  Hypertension  This is a chronic problem. The current episode started more than 1 year ago. The problem has been waxing and waning since onset. The problem is controlled. Pertinent negatives include no anxiety, blurred vision, chest pain, headaches, malaise/fatigue, neck pain, orthopnea, palpitations, peripheral edema, PND, shortness of breath or sweats. There are no associated agents to hypertension. There are no known risk factors for coronary artery disease. Past treatments include calcium channel blockers and angiotensin blockers. The current treatment provides moderate improvement. Identifiable causes of hypertension include a thyroid problem.  Rash  This is a recurrent problem. The problem has been waxing and waning since onset. The rash is diffuse. Pertinent negatives include no cough, diarrhea, fatigue, fever, joint pain, shortness of breath or sore throat. Treatments tried: elimite. The treatment provided mild relief.  Thyroid Problem  Presents for follow-up visit. Patient reports no anxiety, cold intolerance, constipation, depressed mood, diaphoresis, diarrhea, dry skin, fatigue, hair loss, heat intolerance,  hoarse voice, leg swelling, palpitations, tremors, visual change, weight gain or weight loss. The symptoms have been stable.    No problem-specific Assessment & Plan notes found for this encounter.   Past Medical History:  Diagnosis Date  . Cancer ([place]) 1981   melanoma  . GERD (gastroesophageal reflux disease)   . Hyperlipidemia   . Hypertension   . Thyroid disease     Past Surgical History:  Procedure Laterality Date  . APPENDECTOMY    . COLONOSCOPY  2008   Dr Sonny Masters- repeat in 10 years  . SKIN CANCER EXCISION     foot    Family History  Problem Relation Age of Onset  . Breast cancer Cousin        mat cousin  . Diabetes Sister   . Diabetes Son     Social History   Social History  . Marital status: Married    Spouse name: N/A  . Number of children: N/A  . Years of education: N/A   Occupational History  . Not on file.   Social History Main Topics  . Smoking status: Never Smoker  . Smokeless tobacco: Never Used  . Alcohol use No  . Drug use: No  . Sexual activity: Not Currently   Other Topics Concern  . Not on file   Social History Narrative  . No narrative on file    Allergies  Allergen Reactions  . Sulfa Antibiotics     Outpatient Medications Prior to Visit  Medication Sig Dispense Refill  . aspirin 81 MG tablet Take 81 mg by mouth daily.    . cetirizine (ZYRTEC) 10 MG tablet Take 1 tablet (10 mg total) by mouth daily. 30 tablet 11  .  etodolac (LODINE) 500 MG tablet Take 1 tablet (500 mg total) by mouth 2 (two) times daily. 60 tablet 11  . montelukast (SINGULAIR) 10 MG tablet Take 1 tablet (10 mg total) by mouth at bedtime. 30 tablet 11  . permethrin (ELIMITE) 5 % cream APPLY ONCE TO ENTIRE BODY. LEAVE ON 8-14 HOURS THEN RINSE-REPEAT IN ONE WEEK 60 g 0  . amLODipine (NORVASC) 5 MG tablet Take 1 tablet (5 mg total) by mouth daily. 90 tablet 1  . levothyroxine (SYNTHROID, LEVOTHROID) 25 MCG tablet TAKE ONE TABLET BY MOUTH ONCE DAILY BEFORE  BREAKFAST 90 tablet 1  . losartan (COZAAR) 50 MG tablet Take 1 tablet (50 mg total) by mouth daily. 90 tablet 1  . pantoprazole (PROTONIX) 40 MG tablet Take 1 tablet (40 mg total) by mouth daily. 90 tablet 1  . fluticasone (FLONASE) 50 MCG/ACT nasal spray Place 2 sprays into both nostrils daily. (Patient not taking: Reported on 01/31/2017) 16 g 6  . clotrimazole-betamethasone (LOTRISONE) cream Apply 1 application topically 2 (two) times daily. 30 g 0  . triamcinolone cream (KENALOG) 0.1 % Apply 1 application topically 2 (two) times daily. 30 g 0   No facility-administered medications prior to visit.     Review of Systems  Constitutional: Negative for chills, diaphoresis, fatigue, fever, malaise/fatigue, weight gain and weight loss.  HENT: Negative for ear discharge, ear pain, hoarse voice and sore throat.   Eyes: Negative for blurred vision.  Respiratory: Negative for cough, sputum production, choking, shortness of breath and wheezing.   Cardiovascular: Negative for chest pain, palpitations, orthopnea, leg swelling and PND.  Gastrointestinal: Negative for abdominal pain, blood in stool, constipation, diarrhea, dysphagia, heartburn, melena and nausea.  Genitourinary: Negative for dysuria, frequency, hematuria and urgency.  Musculoskeletal: Negative for back pain, joint pain, myalgias, muscle weakness and neck pain.  Skin: Positive for rash.  Neurological: Negative for dizziness, tingling, tremors, sensory change, focal weakness and headaches.  Endo/Heme/Allergies: Negative for environmental allergies, cold intolerance, heat intolerance and polydipsia. Does not bruise/bleed easily.  Psychiatric/Behavioral: Negative for depression and suicidal ideas. The patient is not nervous/anxious and does not have insomnia.      Objective  Vitals:   01/31/17 0853  BP: 138/82  Pulse: 80  Weight: 140 lb (63.5 kg)  Height: 5\' 6"  (1.676 m)    Physical Exam  Constitutional: She is well-developed,  well-nourished, and in no distress. No distress.  HENT:  Head: Normocephalic and atraumatic.  Right Ear: External ear normal.  Left Ear: External ear normal.  Nose: Nose normal.  Mouth/Throat: Oropharynx is clear and moist.  Eyes: Pupils are equal, round, and reactive to light. Conjunctivae and EOM are normal. Right eye exhibits no discharge. Left eye exhibits no discharge.  Neck: Normal range of motion. Neck supple. No JVD present. No thyromegaly present.  Cardiovascular: Normal rate, regular rhythm, normal heart sounds and intact distal pulses.  Exam reveals no gallop and no friction rub.   No murmur heard. Pulmonary/Chest: Effort normal and breath sounds normal. She has no wheezes. She has no rales.  Abdominal: Soft. Bowel sounds are normal. She exhibits no mass. There is no tenderness. There is no guarding.  Musculoskeletal: Normal range of motion. She exhibits no edema.  Lymphadenopathy:    She has no cervical adenopathy.  Neurological: She is alert. She has normal reflexes.  Skin: Skin is warm and dry. She is not diaphoretic.  Psychiatric: Mood and affect normal.  Nursing note and vitals reviewed.  Assessment & Plan  Problem List Items Addressed This Visit      Cardiovascular and Mediastinum   Essential hypertension - Primary   Relevant Medications   losartan (COZAAR) 50 MG tablet   amLODipine (NORVASC) 5 MG tablet   Other Relevant Orders   Renal Function Panel     Digestive   Esophageal reflux   Relevant Medications   pantoprazole (PROTONIX) 40 MG tablet     Endocrine   Hypothyroidism   Relevant Medications   levothyroxine (SYNTHROID, LEVOTHROID) 25 MCG tablet   Other Relevant Orders   TSH    Other Visit Diagnoses    Need for 23-polyvalent pneumococcal polysaccharide vaccine       Insect bite, initial encounter       antihistamine prn      Meds ordered this encounter  Medications  . levothyroxine (SYNTHROID, LEVOTHROID) 25 MCG tablet    Sig: TAKE  ONE TABLET BY MOUTH ONCE DAILY BEFORE BREAKFAST    Dispense:  90 tablet    Refill:  1  . pantoprazole (PROTONIX) 40 MG tablet    Sig: Take 1 tablet (40 mg total) by mouth daily.    Dispense:  90 tablet    Refill:  1  . losartan (COZAAR) 50 MG tablet    Sig: Take 1 tablet (50 mg total) by mouth daily.    Dispense:  90 tablet    Refill:  1  . amLODipine (NORVASC) 5 MG tablet    Sig: Take 1 tablet (5 mg total) by mouth daily.    Dispense:  90 tablet    Refill:  1      Dr. Otilio Miu [facility]  01/31/17

## 2017-02-01 LAB — RENAL FUNCTION PANEL
ALBUMIN: 4.6 g/dL (ref 3.5–4.8)
BUN / CREAT RATIO: 11 — AB (ref 12–28)
BUN: 8 mg/dL (ref 8–27)
CALCIUM: 9.1 mg/dL (ref 8.7–10.3)
CO2: 23 mmol/L (ref 20–29)
Chloride: 99 mmol/L (ref 96–106)
Creatinine, Ser: 0.72 mg/dL (ref 0.57–1.00)
GFR calc Af Amer: 97 mL/min/{1.73_m2} (ref >59)
GFR calc non Af Amer: 85 mL/min/{1.73_m2} (ref >59)
Glucose: 80 mg/dL (ref 65–99)
PHOSPHORUS: 3.1 mg/dL (ref 2.5–4.5)
Potassium: 4.3 mmol/L (ref 3.5–5.2)
Sodium: 141 mmol/L (ref 134–144)

## 2017-02-01 LAB — TSH: TSH: 3.59 u[IU]/mL (ref 0.450–4.500)

## 2017-02-02 ENCOUNTER — Other Ambulatory Visit: Payer: Self-pay

## 2017-05-02 DIAGNOSIS — Z23 Encounter for immunization: Secondary | ICD-10-CM | POA: Diagnosis not present

## 2017-07-25 ENCOUNTER — Telehealth: Payer: Self-pay | Admitting: Family Medicine

## 2017-07-25 NOTE — Telephone Encounter (Signed)
Called to schedule AWV with Nurse Health Advisor. Last AWV on 2/19 /18 please schedule AWV with NHA on 2/20 at 8:15 move cpe to [place] [phone]  Skype [email or web address]

## 2017-07-26 ENCOUNTER — Ambulatory Visit: Payer: Medicare Other | Admitting: Family Medicine

## 2017-07-26 ENCOUNTER — Encounter: Payer: Self-pay | Admitting: Family Medicine

## 2017-07-26 VITALS — BP 130/80 | HR 80 | Ht 66.0 in | Wt 142.0 lb

## 2017-07-26 DIAGNOSIS — E032 Hypothyroidism due to medicaments and other exogenous substances: Secondary | ICD-10-CM | POA: Diagnosis not present

## 2017-07-26 DIAGNOSIS — R140 Abdominal distension (gaseous): Secondary | ICD-10-CM

## 2017-07-26 DIAGNOSIS — I10 Essential (primary) hypertension: Secondary | ICD-10-CM

## 2017-07-26 DIAGNOSIS — K219 Gastro-esophageal reflux disease without esophagitis: Secondary | ICD-10-CM | POA: Diagnosis not present

## 2017-07-26 MED ORDER — PANTOPRAZOLE SODIUM 40 MG PO TBEC: 40.0000 mg | DELAYED_RELEASE_TABLET | Freq: Every day | ORAL | 1 refills | Status: DC

## 2017-07-26 MED ORDER — AMLODIPINE BESYLATE 5 MG PO TABS: 5.0000 mg | ORAL_TABLET | Freq: Every day | ORAL | 1 refills | Status: DC

## 2017-07-26 MED ORDER — LEVOTHYROXINE SODIUM 25 MCG PO TABS: ORAL_TABLET | ORAL | 1 refills | Status: DC

## 2017-07-26 MED ORDER — LOSARTAN POTASSIUM 50 MG PO TABS: 50.0000 mg | ORAL_TABLET | Freq: Every day | ORAL | 1 refills | Status: DC

## 2017-07-26 NOTE — Progress Notes (Signed)
Name: Victoria Rush   MRN: [id number]    DOB: Dec 07, 1945   Date:07/26/2017       Progress Note  Subjective  Chief Complaint  Chief Complaint  Patient presents with  . Hypothyroidism  . Gynecologic Exam  . Hypertension    Hypertension  This is a chronic problem. The current episode started more than 1 year ago. The problem is unchanged. The problem is controlled. Pertinent negatives include no anxiety, blurred vision, chest pain, headaches, malaise/fatigue, neck pain, orthopnea, palpitations, peripheral edema, PND, shortness of breath or sweats. There are no associated agents to hypertension. Risk factors for coronary artery disease include dyslipidemia and post-menopausal state. Past treatments include ACE inhibitors and calcium channel blockers. The current treatment provides moderate improvement. There are no compliance problems.  There is no history of angina, kidney disease, CAD/MI, CVA, heart failure, left ventricular hypertrophy, PVD or retinopathy. Identifiable causes of hypertension include a thyroid problem. There is no history of chronic renal disease, a hypertension causing med or renovascular disease. Sleep apnea: gerd.  Thyroid Problem  Presents for follow-up visit. Symptoms include diarrhea. Patient reports no anxiety, cold intolerance, constipation, depressed mood, diaphoresis, dry skin, fatigue, hair loss, heat intolerance, hoarse voice, leg swelling, nail problem, palpitations, tremors, visual change, weight gain or weight loss. The symptoms have been stable. Her past medical history is significant for hyperlipidemia. There is no history of diabetes or heart failure.  Gastroesophageal Reflux  She reports no abdominal pain, no belching, no chest pain, no choking, no coughing, no dysphagia, no early satiety, no globus sensation, no heartburn, no hoarse voice, no nausea, no sore throat, no stridor, no tooth decay, no water brash or no wheezing. This is a new problem. The problem has been  waxing and waning. The symptoms are aggravated by certain foods. Pertinent negatives include no fatigue, melena or weight loss.  Abdominal Cramping  This is a new problem. The current episode started 1 to 4 weeks ago ("just a little Pain"). The onset quality is gradual. The problem occurs intermittently. Duration: minutes. The pain is located in the generalized abdominal region. Quality: bloating. The abdominal pain does not radiate. Associated symptoms include diarrhea and flatus. Pertinent negatives include no belching, constipation, dysuria, fever, frequency, headaches, hematochezia, hematuria, melena, myalgias, nausea or weight loss. Exacerbated by: ?dairy products. Her past medical history is significant for GERD.  Hyperlipidemia  This is a chronic problem. The problem is controlled. Recent lipid tests were reviewed and are normal. Exacerbating diseases include hypothyroidism. She has no history of chronic renal disease, diabetes or obesity. Pertinent negatives include no chest pain, focal weakness, myalgias or shortness of breath. Current antihyperlipidemic treatment includes diet change.    No problem-specific Assessment & Plan notes found for this encounter.   Past Medical History:  Diagnosis Date  . Cancer ([place]) 1981   melanoma  . GERD (gastroesophageal reflux disease)   . Hyperlipidemia   . Hypertension   . Thyroid disease     Past Surgical History:  Procedure Laterality Date  . APPENDECTOMY    . COLONOSCOPY  2008   Dr Sonny Masters- repeat in 10 years  . SKIN CANCER EXCISION     foot    Family History  Problem Relation Age of Onset  . Breast cancer Cousin        mat cousin  . Diabetes Sister   . Diabetes Son     Social History   Socioeconomic History  . Marital status: Married    Spouse  name: Not on file  . Number of children: Not on file  . Years of education: Not on file  . Highest education level: Not on file  Social Needs  . Financial resource strain: Not on file   . Food insecurity - worry: Not on file  . Food insecurity - inability: Not on file  . Transportation needs - medical: Not on file  . Transportation needs - non-medical: Not on file  Occupational History  . Not on file  Tobacco Use  . Smoking status: Never Smoker  . Smokeless tobacco: Never Used  Substance and Sexual Activity  . Alcohol use: No    Alcohol/week: 0.0 oz  . Drug use: No  . Sexual activity: Not Currently  Other Topics Concern  . Not on file  Social History Narrative  . Not on file    Allergies  Allergen Reactions  . Sulfa Antibiotics     Outpatient Medications Prior to Visit  Medication Sig Dispense Refill  . aspirin 81 MG tablet Take 81 mg by mouth daily.    . cetirizine (ZYRTEC) 10 MG tablet Take 1 tablet (10 mg total) by mouth daily. 30 tablet 11  . etodolac (LODINE) 500 MG tablet Take 1 tablet (500 mg total) by mouth 2 (two) times daily. 60 tablet 11  . montelukast (SINGULAIR) 10 MG tablet Take 1 tablet (10 mg total) by mouth at bedtime. 30 tablet 11  . amLODipine (NORVASC) 5 MG tablet Take 1 tablet (5 mg total) by mouth daily. 90 tablet 1  . levothyroxine (SYNTHROID, LEVOTHROID) 25 MCG tablet TAKE ONE TABLET BY MOUTH ONCE DAILY BEFORE BREAKFAST 90 tablet 1  . losartan (COZAAR) 50 MG tablet Take 1 tablet (50 mg total) by mouth daily. 90 tablet 1  . pantoprazole (PROTONIX) 40 MG tablet Take 1 tablet (40 mg total) by mouth daily. 90 tablet 1  . fluticasone (FLONASE) 50 MCG/ACT nasal spray Place 2 sprays into both nostrils daily. (Patient not taking: Reported on 01/31/2017) 16 g 6  . permethrin (ELIMITE) 5 % cream APPLY ONCE TO ENTIRE BODY. LEAVE ON 8-14 HOURS THEN RINSE-REPEAT IN ONE WEEK 60 g 0   No facility-administered medications prior to visit.     Review of Systems  Constitutional: Negative for chills, diaphoresis, fatigue, fever, malaise/fatigue, weight gain and weight loss.  HENT: Negative for ear discharge, ear pain, hoarse voice and sore throat.    Eyes: Negative for blurred vision.  Respiratory: Negative for cough, sputum production, choking, shortness of breath and wheezing.   Cardiovascular: Negative for chest pain, palpitations, orthopnea, leg swelling and PND.  Gastrointestinal: Positive for diarrhea and flatus. Negative for abdominal pain, blood in stool, constipation, dysphagia, heartburn, hematochezia, melena and nausea.  Genitourinary: Negative for dysuria, frequency, hematuria and urgency.  Musculoskeletal: Negative for back pain, joint pain, myalgias and neck pain.  Skin: Negative for rash.  Neurological: Negative for dizziness, tingling, tremors, sensory change, focal weakness and headaches.  Endo/Heme/Allergies: Negative for environmental allergies, cold intolerance, heat intolerance and polydipsia. Does not bruise/bleed easily.  Psychiatric/Behavioral: Negative for depression and suicidal ideas. The patient is not nervous/anxious and does not have insomnia.      Objective  Vitals:   07/26/17 0850  BP: 130/80  Pulse: 80  Weight: 142 lb (64.4 kg)  Height: 5\' 6"  (1.676 m)    Physical Exam  Constitutional: She is well-developed, well-nourished, and in no distress. No distress.  HENT:  Head: Normocephalic and atraumatic.  Right Ear: External ear normal.  Left Ear: External ear normal.  Nose: Nose normal.  Mouth/Throat: Oropharynx is clear and moist.  Eyes: Conjunctivae and EOM are normal. Pupils are equal, round, and reactive to light. Right eye exhibits no discharge. Left eye exhibits no discharge.  Neck: Normal range of motion. Neck supple. No JVD present. No thyromegaly present.  Cardiovascular: Normal rate, regular rhythm, normal heart sounds and intact distal pulses. Exam reveals no gallop and no friction rub.  No murmur heard. Pulmonary/Chest: Effort normal and breath sounds normal. She has no wheezes. She has no rales.  Abdominal: Soft. Bowel sounds are normal. She exhibits no mass. There is no tenderness.  There is no guarding.  Musculoskeletal: Normal range of motion. She exhibits no edema.  Lymphadenopathy:    She has no cervical adenopathy.  Neurological: She is alert. She has normal reflexes.  Skin: Skin is warm and dry. She is not diaphoretic.  Psychiatric: Mood and affect normal.  Nursing note and vitals reviewed.     Assessment & Plan  Problem List Items Addressed This Visit      Cardiovascular and Mediastinum   Essential hypertension - Primary   Relevant Medications   amLODipine (NORVASC) 5 MG tablet   losartan (COZAAR) 50 MG tablet     Digestive   Esophageal reflux   Relevant Medications   pantoprazole (PROTONIX) 40 MG tablet     Endocrine   Hypothyroidism   Relevant Medications   levothyroxine (SYNTHROID, LEVOTHROID) 25 MCG tablet    Other Visit Diagnoses    Abdominal bloating       ? lactose intolerance      Meds ordered this encounter  Medications  . amLODipine (NORVASC) 5 MG tablet    Sig: Take 1 tablet (5 mg total) by mouth daily.    Dispense:  90 tablet    Refill:  1  . losartan (COZAAR) 50 MG tablet    Sig: Take 1 tablet (50 mg total) by mouth daily.    Dispense:  90 tablet    Refill:  1  . pantoprazole (PROTONIX) 40 MG tablet    Sig: Take 1 tablet (40 mg total) by mouth daily.    Dispense:  90 tablet    Refill:  1  . levothyroxine (SYNTHROID, LEVOTHROID) 25 MCG tablet    Sig: TAKE ONE TABLET BY MOUTH ONCE DAILY BEFORE BREAKFAST    Dispense:  90 tablet    Refill:  1      Dr. Ayeshia Coppin [facility]  07/26/17

## 2017-07-26 NOTE — Patient Instructions (Signed)
Diet for Lactose Intolerance, Adult Lactose intolerance is when the body is not able to digest lactose, a natural sugar found in milk and milk products. If you are lactose intolerant, you should avoid consuming food and drinks with lactose. What do I need to know about this diet?  Avoid consuming foods and beverages with lactose.  Look for the words "lactose-free" or "lactose-reduced" on food labels. You can have lactose-free foods and may be able to have small amounts of lactose-reduced foods.  Make sure you get enough nutrients in your diet. People on this diet sometimes have trouble getting enough calcium, riboflavin, and vitamin D. Take supplements if directed by your health care provider. Talk to your health care provider about supplements if you are not taking any. Which foods have lactose? Lactose is found in milk and milk products, such as:  Yogurt.  Cheese.  Butter.  Margarine.  Sour cream.  Creamer.  Whipped toppings and nondairy creamers.  Ice cream and other milk-based desserts.  Lactose is also found in foods made with milk or milk ingredients. To find out whether a food is made with milk or a milk ingredient, look at the ingredients list. Avoid foods with the statement "May contain milk" and foods that contain:  Butter.  Cream.  Milk.  Milk solids.  Milk powder.  Whey.  Curd.  Caseinate.  Lactose.  What are some alternatives to milk and foods made with milk products?  Lactose-free products, such as lactose-free milk.  Almond or rice milk.  Soy products, such as soy yogurt, soy cheese, soy ice cream, soy-based sour cream, and soy-based infant formula.  Nondairy products, such as nondairy creamers and nondairy whipped topping. Note that nondairy products sometimes contain lactose, so it is important to check the ingredients list. Can I have any foods with lactose? Some people with lactose intolerance can safely eat foods that have a little  lactose. Foods with a little lactose have less than 1 g of lactose per serving. Examples of foods with a little lactose are:  Aged cheese (such as Swiss, cheddar, or Parmesan cheese). One serving is about 1-2 oz.  Cream cheese. One serving is about 2 Tbsp.  Ricotta cheese. One serving is about  cup.  If you decide to try a food that has lactose:  Eat only one food with lactose in it at a time.  Eat only a small amount of the food.  Stop eating the food if your symptoms return.  Some dairy products that are more likely than others to be tolerated include:  Cheese, especially if it is aged.  Cultured dairy products, such as yogurt, buttermilk, cottage cheese, and kefir. The healthy bacteria in these products help digest lactose.  Lactose-hydrolyzed milk. This product contains 40-90% less lactose than milk.  Am I getting enough calcium? Calcium is found in many foods with lactose and is important for bone health. The amount of calcium you need depends on your age:  Adults younger than 50 years need 1000 mg of calcium a day.  Adults older than 50 years need 1200 mg of calcium a day.  Make sure you get enough calcium by taking a calcium supplement or by eating lactose-free foods that are high in calcium, such as:  Almonds,  cup (95 mg).  Broccoli, cooked, 1 cup (60 mg).  Calcium-fortified breakfast cereals, 1 cup ([phone] mg).  Calcium-fortified rice or almond milk, 1 cup (300 mg).  Calcium-fortified soy milk, 1 cup (300-400 mg).  Canned salmon  with edible bones, 3 oz (180 mg).  Collard greens, cooked,  cup (125 mg).  Edamame, cooked,  cup (125 mg).  Kale, frozen or cooked,  cup (90 mg).  Orange juice with calcium added, 1 cup (300-350 mg).  Sardines with edible bones, 3 oz (325 mg).  Spinach, cooked,  cup (145 mg).  Tofu set with calcium sulfate,  cup (250 mg).  This information is not intended to replace advice given to you by your health care provider.  Make sure you discuss any questions you have with your health care provider. Document Released: 01/15/2014 Document Revised: 11/26/2015 Document Reviewed: 08/23/2013 Elsevier Interactive Patient Education  [employer].

## 2017-08-23 ENCOUNTER — Ambulatory Visit: Payer: Medicare Other | Admitting: Family Medicine

## 2017-08-23 ENCOUNTER — Encounter: Payer: Self-pay | Admitting: Family Medicine

## 2017-08-23 VITALS — BP 120/80 | HR 72 | Ht 66.0 in | Wt 137.0 lb

## 2017-08-23 DIAGNOSIS — R079 Chest pain, unspecified: Secondary | ICD-10-CM | POA: Diagnosis not present

## 2017-08-23 DIAGNOSIS — J301 Allergic rhinitis due to pollen: Secondary | ICD-10-CM | POA: Diagnosis not present

## 2017-08-23 DIAGNOSIS — I10 Essential (primary) hypertension: Secondary | ICD-10-CM

## 2017-08-23 DIAGNOSIS — Z23 Encounter for immunization: Secondary | ICD-10-CM

## 2017-08-23 DIAGNOSIS — S76111A Strain of right quadriceps muscle, fascia and tendon, initial encounter: Secondary | ICD-10-CM | POA: Insufficient documentation

## 2017-08-23 MED ORDER — ETODOLAC 500 MG PO TABS: 500.0000 mg | ORAL_TABLET | Freq: Two times a day (BID) | ORAL | 11 refills | Status: DC

## 2017-08-23 MED ORDER — AMLODIPINE BESYLATE 5 MG PO TABS
5.0000 mg | ORAL_TABLET | Freq: Every day | ORAL | 1 refills | Status: DC
Start: 2017-08-23 — End: 2017-12-14

## 2017-08-23 MED ORDER — MONTELUKAST SODIUM 10 MG PO TABS: 10.0000 mg | ORAL_TABLET | Freq: Every day | ORAL | 11 refills | Status: DC

## 2017-08-23 NOTE — Progress Notes (Signed)
Name: Victoria Rush   MRN: [id number]    DOB: [birth year]   Date:08/23/2017       Progress Note  Subjective  Chief Complaint  Chief Complaint  Patient presents with  . Allergic Rhinitis   . Arthritis    etodolac refilled  . Hypertension  . pneum 23    needed    Arthritis  Presents for follow-up visit. She reports no pain, stiffness, joint swelling or joint warmth. The symptoms have been stable. Her pain is at a severity of 4/10. Pertinent negatives include no diarrhea, dry eyes, dry mouth, dysuria, fatigue, fever, pain at night, pain while resting, rash, Raynaud's syndrome, uveitis or weight loss.  Hypertension  This is a chronic problem. The current episode started more than 1 year ago. The problem is unchanged. The problem is controlled. Associated symptoms include palpitations. Pertinent negatives include no anxiety, blurred vision, chest pain, headaches, malaise/fatigue, neck pain, orthopnea, peripheral edema, PND, shortness of breath or sweats. There are no associated agents to hypertension. There are no known risk factors for coronary artery disease. Past treatments include calcium channel blockers. There are no compliance problems.  There is no history of angina, kidney disease, CAD/MI, CVA, heart failure, left ventricular hypertrophy, PVD or retinopathy. There is no history of chronic renal disease, a hypertension causing med or renovascular disease.  URI   This is a new problem. The current episode started yesterday. The problem has been gradually worsening. There has been no fever. Pertinent negatives include no abdominal pain, chest pain, coughing, diarrhea, dysuria, ear pain, headaches, joint pain, nausea, neck pain, rash, sore throat or wheezing. Treatments tried: singulair. The treatment provided no relief.  Chest Pain   This is a new problem. The current episode started more than 1 month ago. The problem occurs intermittently. The pain is mild. Quality: "tingle" The pain does not  radiate. Associated symptoms include palpitations. Pertinent negatives include no abdominal pain, back pain, claudication, cough, diaphoresis, dizziness, fever, headaches, irregular heartbeat, leg pain, malaise/fatigue, nausea, orthopnea, PND, shortness of breath or sputum production. She has tried nothing ("just pat myself") for the symptoms.  Her past medical history is significant for hypertension.  Pertinent negatives for past medical history include no PVD.    No problem-specific Assessment & Plan notes found for this encounter.   Past Medical History:  Diagnosis Date  . Cancer ([place]) 1981   melanoma  . GERD (gastroesophageal reflux disease)   . Hyperlipidemia   . Hypertension   . Thyroid disease     Past Surgical History:  Procedure Laterality Date  . APPENDECTOMY    . COLONOSCOPY  2008   Dr Sonny Masters- repeat in 10 years  . SKIN CANCER EXCISION     foot    Family History  Problem Relation Age of Onset  . Breast cancer Cousin        mat cousin  . Diabetes Sister   . Diabetes Son     Social History   Socioeconomic History  . Marital status: Married    Spouse name: Not on file  . Number of children: Not on file  . Years of education: Not on file  . Highest education level: Not on file  Social Needs  . Financial resource strain: Not on file  . Food insecurity - worry: Not on file  . Food insecurity - inability: Not on file  . Transportation needs - medical: Not on file  . Transportation needs - non-medical: Not on file  Occupational  History  . Not on file  Tobacco Use  . Smoking status: Never Smoker  . Smokeless tobacco: Never Used  Substance and Sexual Activity  . Alcohol use: No    Alcohol/week: 0.0 oz  . Drug use: No  . Sexual activity: Not Currently  Other Topics Concern  . Not on file  Social History Narrative  . Not on file    Allergies  Allergen Reactions  . Sulfa Antibiotics     Outpatient Medications Prior to Visit  Medication Sig Dispense  Refill  . aspirin 81 MG tablet Take 81 mg by mouth daily.    . cetirizine (ZYRTEC) 10 MG tablet Take 1 tablet (10 mg total) by mouth daily. 30 tablet 11  . levothyroxine (SYNTHROID, LEVOTHROID) 25 MCG tablet TAKE ONE TABLET BY MOUTH ONCE DAILY BEFORE BREAKFAST 90 tablet 1  . losartan (COZAAR) 50 MG tablet Take 1 tablet (50 mg total) by mouth daily. 90 tablet 1  . pantoprazole (PROTONIX) 40 MG tablet Take 1 tablet (40 mg total) by mouth daily. 90 tablet 1  . amLODipine (NORVASC) 5 MG tablet Take 1 tablet (5 mg total) by mouth daily. 90 tablet 1  . etodolac (LODINE) 500 MG tablet Take 1 tablet (500 mg total) by mouth 2 (two) times daily. 60 tablet 11  . montelukast (SINGULAIR) 10 MG tablet Take 1 tablet (10 mg total) by mouth at bedtime. 30 tablet 11  . fluticasone (FLONASE) 50 MCG/ACT nasal spray Place 2 sprays into both nostrils daily. (Patient not taking: Reported on 01/31/2017) 16 g 6   No facility-administered medications prior to visit.     Review of Systems  Constitutional: Negative for chills, diaphoresis, fatigue, fever, malaise/fatigue and weight loss.  HENT: Negative for ear discharge, ear pain and sore throat.   Eyes: Negative for blurred vision.  Respiratory: Negative for cough, sputum production, shortness of breath and wheezing.   Cardiovascular: Positive for palpitations. Negative for chest pain, orthopnea, claudication, leg swelling and PND.  Gastrointestinal: Negative for abdominal pain, blood in stool, constipation, diarrhea, heartburn, melena and nausea.  Genitourinary: Negative for dysuria, frequency, hematuria and urgency.  Musculoskeletal: Positive for arthritis. Negative for back pain, joint pain, joint swelling, myalgias, neck pain and stiffness.  Skin: Negative for rash.  Neurological: Negative for dizziness, tingling, sensory change, focal weakness and headaches.  Endo/Heme/Allergies: Negative for environmental allergies and polydipsia. Does not bruise/bleed easily.   Psychiatric/Behavioral: Negative for depression and suicidal ideas. The patient is not nervous/anxious and does not have insomnia.      Objective  Vitals:   08/23/17 0837  BP: 120/80  Pulse: 72  Weight: 137 lb (62.1 kg)  Height: 5\' 6"  (1.676 m)    Physical Exam  Constitutional: She is well-developed, well-nourished, and in no distress. No distress.  HENT:  Head: Normocephalic and atraumatic.  Right Ear: External ear normal.  Left Ear: External ear normal.  Nose: Nose normal.  Mouth/Throat: Oropharynx is clear and moist.  Eyes: Conjunctivae and EOM are normal. Pupils are equal, round, and reactive to light. Right eye exhibits no discharge. Left eye exhibits no discharge.  Neck: Normal range of motion. Neck supple. No JVD present. No thyromegaly present.  Cardiovascular: Normal rate, regular rhythm, normal heart sounds and intact distal pulses. Exam reveals no gallop and no friction rub.  No murmur heard. Pulmonary/Chest: Effort normal and breath sounds normal. She has no wheezes. She has no rales.  Abdominal: Soft. Bowel sounds are normal. She exhibits no mass. There is  no tenderness. There is no guarding.  Musculoskeletal: Normal range of motion. She exhibits no edema.  Lymphadenopathy:    She has no cervical adenopathy.  Neurological: She is alert. She has normal reflexes.  Skin: Skin is warm and dry. No rash noted. She is not diaphoretic.  Psychiatric: Mood and affect normal.      Assessment & Plan  Problem List Items Addressed This Visit      Cardiovascular and Mediastinum   Essential hypertension   Relevant Medications   amLODipine (NORVASC) 5 MG tablet   Other Relevant Orders   Renal Function Panel     Respiratory   Chronic seasonal allergic rhinitis due to pollen   Relevant Medications   montelukast (SINGULAIR) 10 MG tablet     Musculoskeletal and Integument   Quadriceps strain, right, initial encounter   Relevant Medications   etodolac (LODINE) 500 MG  tablet    Other Visit Diagnoses    Chest pain, unspecified type    -  Primary   NEW   Relevant Orders   EKG 12-Lead   Need for 23-polyvalent pneumococcal polysaccharide vaccine       Relevant Orders   Pneumococcal polysaccharide vaccine 23-valent greater than or equal to 2yo subcutaneous/IM (Completed)      Meds ordered this encounter  Medications  . montelukast (SINGULAIR) 10 MG tablet    Sig: Take 1 tablet (10 mg total) by mouth at bedtime.    Dispense:  30 tablet    Refill:  11  . amLODipine (NORVASC) 5 MG tablet    Sig: Take 1 tablet (5 mg total) by mouth daily.    Dispense:  90 tablet    Refill:  1  . etodolac (LODINE) 500 MG tablet    Sig: Take 1 tablet (500 mg total) by mouth 2 (two) times daily.    Dispense:  60 tablet    Refill:  11    Please consider 90 day supplies to promote better adherence      Dr. Otilio Miu [facility]  08/23/17

## 2017-08-24 DIAGNOSIS — H25013 Cortical age-related cataract, bilateral: Secondary | ICD-10-CM | POA: Diagnosis not present

## 2017-08-24 LAB — RENAL FUNCTION PANEL
Albumin: 4.5 g/dL (ref 3.5–4.8)
BUN/Creatinine Ratio: 12 (ref 12–28)
BUN: 9 mg/dL (ref 8–27)
CALCIUM: 9.7 mg/dL (ref 8.7–10.3)
CHLORIDE: 99 mmol/L (ref 96–106)
CO2: 25 mmol/L (ref 20–29)
Creatinine, Ser: 0.78 mg/dL (ref 0.57–1.00)
GFR calc Af Amer: 88 mL/min/{1.73_m2} (ref >59)
GFR calc non Af Amer: 76 mL/min/{1.73_m2} (ref >59)
Glucose: 84 mg/dL (ref 65–99)
PHOSPHORUS: 3.7 mg/dL (ref 2.5–4.5)
POTASSIUM: 4.3 mmol/L (ref 3.5–5.2)
SODIUM: 140 mmol/L (ref 134–144)

## 2017-08-29 ENCOUNTER — Ambulatory Visit: Payer: Medicare Other | Admitting: Family Medicine

## 2017-08-29 ENCOUNTER — Encounter: Payer: Self-pay | Admitting: Family Medicine

## 2017-08-29 VITALS — BP 100/70 | HR 72 | Temp 98.4°F | Ht 66.0 in | Wt 140.0 lb

## 2017-08-29 DIAGNOSIS — J0100 Acute maxillary sinusitis, unspecified: Secondary | ICD-10-CM

## 2017-08-29 MED ORDER — GUAIFENESIN-CODEINE 100-10 MG/5ML PO SYRP: 5.0000 mL | ORAL_SOLUTION | Freq: Three times a day (TID) | ORAL | 0 refills | Status: DC | PRN

## 2017-08-29 MED ORDER — AMOXICILLIN 500 MG PO CAPS: 500.0000 mg | ORAL_CAPSULE | Freq: Three times a day (TID) | ORAL | 0 refills | Status: DC

## 2017-08-29 NOTE — Progress Notes (Signed)
Name: Victoria Rush   MRN: [id number]    DOB: 03-04-1946   Date:08/29/2017       Progress Note  Subjective  Chief Complaint  Chief Complaint  Patient presents with  . Sinusitis    sore throat, cong, pressure in head, cough- taking Mucinex- not helping    Sinusitis  This is a new problem. The current episode started in the past 7 days. The problem has been gradually worsening since onset. There has been no fever. The fever has been present for 1 to 2 days. Her pain is at a severity of 2/10. The pain is mild. Associated symptoms include congestion, coughing and sinus pressure. Pertinent negatives include no chills, diaphoresis, ear pain, headaches, hoarse voice, neck pain, shortness of breath, sneezing, sore throat or swollen glands. The treatment provided mild relief.    No problem-specific Assessment & Plan notes found for this encounter.   Past Medical History:  Diagnosis Date  . Cancer ([place]) 1981   melanoma  . GERD (gastroesophageal reflux disease)   . Hyperlipidemia   . Hypertension   . Thyroid disease     Past Surgical History:  Procedure Laterality Date  . APPENDECTOMY    . COLONOSCOPY  2008   Dr Sonny Masters- repeat in 10 years  . SKIN CANCER EXCISION     foot    Family History  Problem Relation Age of Onset  . Breast cancer Cousin        mat cousin  . Diabetes Sister   . Diabetes Son     Social History   Socioeconomic History  . Marital status: Married    Spouse name: Not on file  . Number of children: Not on file  . Years of education: Not on file  . Highest education level: Not on file  Social Needs  . Financial resource strain: Not on file  . Food insecurity - worry: Not on file  . Food insecurity - inability: Not on file  . Transportation needs - medical: Not on file  . Transportation needs - non-medical: Not on file  Occupational History  . Not on file  Tobacco Use  . Smoking status: Never Smoker  . Smokeless tobacco: Never Used  Substance and  Sexual Activity  . Alcohol use: No    Alcohol/week: 0.0 oz  . Drug use: No  . Sexual activity: Not Currently  Other Topics Concern  . Not on file  Social History Narrative  . Not on file    Allergies  Allergen Reactions  . Sulfa Antibiotics     Outpatient Medications Prior to Visit  Medication Sig Dispense Refill  . amLODipine (NORVASC) 5 MG tablet Take 1 tablet (5 mg total) by mouth daily. 90 tablet 1  . aspirin 81 MG tablet Take 81 mg by mouth daily.    . cetirizine (ZYRTEC) 10 MG tablet Take 1 tablet (10 mg total) by mouth daily. 30 tablet 11  . etodolac (LODINE) 500 MG tablet Take 1 tablet (500 mg total) by mouth 2 (two) times daily. 60 tablet 11  . levothyroxine (SYNTHROID, LEVOTHROID) 25 MCG tablet TAKE ONE TABLET BY MOUTH ONCE DAILY BEFORE BREAKFAST 90 tablet 1  . losartan (COZAAR) 50 MG tablet Take 1 tablet (50 mg total) by mouth daily. 90 tablet 1  . montelukast (SINGULAIR) 10 MG tablet Take 1 tablet (10 mg total) by mouth at bedtime. 30 tablet 11  . pantoprazole (PROTONIX) 40 MG tablet Take 1 tablet (40 mg total) by mouth daily.  90 tablet 1  . fluticasone (FLONASE) 50 MCG/ACT nasal spray Place 2 sprays into both nostrils daily. (Patient not taking: Reported on 08/29/2017) 16 g 6   No facility-administered medications prior to visit.     Review of Systems  Constitutional: Negative for chills, diaphoresis, fever, malaise/fatigue and weight loss.  HENT: Positive for congestion and sinus pressure. Negative for ear discharge, ear pain, hoarse voice, sneezing and sore throat.   Eyes: Negative for blurred vision.  Respiratory: Positive for cough. Negative for sputum production, shortness of breath and wheezing.   Cardiovascular: Negative for chest pain, palpitations and leg swelling.  Gastrointestinal: Negative for abdominal pain, blood in stool, constipation, diarrhea, heartburn, melena and nausea.  Genitourinary: Negative for dysuria, frequency, hematuria and urgency.   Musculoskeletal: Negative for back pain, joint pain, myalgias and neck pain.  Skin: Negative for rash.  Neurological: Negative for dizziness, tingling, sensory change, focal weakness and headaches.  Endo/Heme/Allergies: Negative for environmental allergies and polydipsia. Does not bruise/bleed easily.  Psychiatric/Behavioral: Negative for depression and suicidal ideas. The patient is not nervous/anxious and does not have insomnia.      Objective  Vitals:   08/29/17 1041  BP: 100/70  Pulse: 72  Temp: 98.4 F (36.9 C)  TempSrc: Oral  Weight: 140 lb (63.5 kg)  Height: 5\' 6"  (1.676 m)    Physical Exam  Constitutional: She is well-developed, well-nourished, and in no distress. No distress.  HENT:  Head: Normocephalic and atraumatic.  Right Ear: Tympanic membrane, external ear and ear canal normal.  Left Ear: Tympanic membrane, external ear and ear canal normal.  Nose: Mucosal edema present.  Mouth/Throat: Posterior oropharyngeal erythema present. No oropharyngeal exudate or posterior oropharyngeal edema.  Purulent postnasal drainage  Eyes: Conjunctivae and EOM are normal. Pupils are equal, round, and reactive to light. Right eye exhibits no discharge. Left eye exhibits no discharge.  Neck: Normal range of motion. Neck supple. No JVD present. No thyromegaly present.  Cardiovascular: Normal rate, regular rhythm, normal heart sounds and intact distal pulses. Exam reveals no gallop and no friction rub.  No murmur heard. Pulmonary/Chest: Effort normal. She has no wheezes. She has no rales.  Abdominal: Soft. Bowel sounds are normal. She exhibits no mass. There is no tenderness. There is no guarding.  Musculoskeletal: Normal range of motion. She exhibits no edema.  Lymphadenopathy:    She has no cervical adenopathy.  Neurological: She is alert. She has normal reflexes.  Skin: Skin is warm and dry. She is not diaphoretic.  Psychiatric: Mood and affect normal.  Nursing note and vitals  reviewed.     Assessment & Plan  Problem List Items Addressed This Visit    None    Visit Diagnoses    Acute maxillary sinusitis, recurrence not specified    -  Primary   Relevant Medications   amoxicillin (AMOXIL) 500 MG capsule   guaiFENesin-codeine (ROBITUSSIN AC) 100-10 MG/5ML syrup      Meds ordered this encounter  Medications  . amoxicillin (AMOXIL) 500 MG capsule    Sig: Take 1 capsule (500 mg total) by mouth 3 (three) times daily.    Dispense:  30 capsule    Refill:  0  . guaiFENesin-codeine (ROBITUSSIN AC) 100-10 MG/5ML syrup    Sig: Take 5 mLs by mouth 3 (three) times daily as needed for cough.    Dispense:  150 mL    Refill:  0      Dr. [facility]  08/29/17   

## 2017-12-14 ENCOUNTER — Ambulatory Visit: Payer: Medicare Other | Admitting: Family Medicine

## 2017-12-14 ENCOUNTER — Encounter: Payer: Self-pay | Admitting: Family Medicine

## 2017-12-14 VITALS — BP 122/86 | HR 72 | Ht 66.0 in | Wt 138.0 lb

## 2017-12-14 DIAGNOSIS — I10 Essential (primary) hypertension: Secondary | ICD-10-CM

## 2017-12-14 DIAGNOSIS — K219 Gastro-esophageal reflux disease without esophagitis: Secondary | ICD-10-CM | POA: Diagnosis not present

## 2017-12-14 DIAGNOSIS — E032 Hypothyroidism due to medicaments and other exogenous substances: Secondary | ICD-10-CM | POA: Diagnosis not present

## 2017-12-14 DIAGNOSIS — E7800 Pure hypercholesterolemia, unspecified: Secondary | ICD-10-CM | POA: Diagnosis not present

## 2017-12-14 DIAGNOSIS — Z1211 Encounter for screening for malignant neoplasm of colon: Secondary | ICD-10-CM

## 2017-12-14 MED ORDER — PANTOPRAZOLE SODIUM 40 MG PO TBEC: 40.0000 mg | DELAYED_RELEASE_TABLET | Freq: Every day | ORAL | 1 refills | Status: DC

## 2017-12-14 MED ORDER — LOSARTAN POTASSIUM 50 MG PO TABS: 50.0000 mg | ORAL_TABLET | Freq: Every day | ORAL | 7 refills | Status: DC

## 2017-12-14 MED ORDER — AMLODIPINE BESYLATE 5 MG PO TABS
5.0000 mg | ORAL_TABLET | Freq: Every day | ORAL | 1 refills | Status: DC
Start: 2017-12-14 — End: 2018-08-28

## 2017-12-14 MED ORDER — LEVOTHYROXINE SODIUM 25 MCG PO TABS: ORAL_TABLET | ORAL | 1 refills | Status: DC

## 2017-12-14 NOTE — Assessment & Plan Note (Signed)
Controlled and will continue pantoprazole 40 mg q day.

## 2017-12-14 NOTE — Assessment & Plan Note (Signed)
Stable on current dose levothyroxin 25 mcgm q day and will do TSH to evaualte current status.

## 2017-12-14 NOTE — Assessment & Plan Note (Signed)
Controlled. Will continue Losartin 50 mg and Amlodipine 5 mg q day and check renal panel today.

## 2017-12-14 NOTE — Progress Notes (Signed)
Name: Victoria Rush   MRN: [id number]    DOB: 17-Sep-1945   Date:12/14/2017       Progress Note  Subjective  Chief Complaint  Chief Complaint  Patient presents with  . Hypothyroidism  . Hypertension  . Gastroesophageal Reflux    Hypertension  This is a chronic problem. The current episode started more than 1 year ago. The problem is unchanged. The problem is controlled. Pertinent negatives include no anxiety, blurred vision, chest pain, headaches, malaise/fatigue, neck pain, orthopnea, palpitations, peripheral edema, PND, shortness of breath or sweats. There are no associated agents to hypertension. Risk factors for coronary artery disease include dyslipidemia. Past treatments include angiotensin blockers and calcium channel blockers. The current treatment provides moderate improvement. There are no compliance problems.  There is no history of angina, kidney disease, CAD/MI, CVA, heart failure, left ventricular hypertrophy, PVD or retinopathy. Identifiable causes of hypertension include a thyroid problem. There is no history of chronic renal disease, a hypertension causing med or renovascular disease.  Gastroesophageal Reflux  She reports no abdominal pain, no belching, no chest pain, no choking, no coughing, no dysphagia, no early satiety, no globus sensation, no heartburn, no hoarse voice, no nausea, no sore throat, no stridor, no tooth decay, no water brash or no wheezing. This is a chronic problem. The problem occurs rarely. The problem has been unchanged. The symptoms are aggravated by certain foods. Pertinent negatives include no anemia, fatigue, melena, muscle weakness, orthopnea or weight loss. She has tried a PPI for the symptoms. The treatment provided moderate relief.  Hyperlipidemia  This is a chronic problem. The problem is controlled. She has no history of chronic renal disease. Pertinent negatives include no chest pain, focal sensory loss, focal weakness, leg pain, myalgias or shortness  of breath. Current antihyperlipidemic treatment includes diet change. The current treatment provides mild improvement of lipids. There are no compliance problems.   Thyroid Problem  Presents for follow-up visit. Patient reports no cold intolerance, constipation, depressed mood, fatigue, hair loss, hoarse voice, leg swelling, palpitations, tremors, weight gain or weight loss. The symptoms have been stable. Her past medical history is significant for hyperlipidemia. There is no history of heart failure.    Essential hypertension Controlled. Will continue Losartin 50 mg and Amlodipine 5 mg q day and check renal panel today.  Esophageal reflux Controlled and will continue pantoprazole 40 mg q day.  Hypothyroidism Stable on current dose levothyroxin 25 mcgm q day and will do TSH to evaualte current status.  Hyperlipidemia Controlled by diet and will do lipid to asset level of control.   Past Medical History:  Diagnosis Date  . Cancer ([place]) 1981   melanoma  . GERD (gastroesophageal reflux disease)   . Hyperlipidemia   . Hypertension   . Thyroid disease     Past Surgical History:  Procedure Laterality Date  . APPENDECTOMY    . COLONOSCOPY  2008   Dr Sonny Masters- repeat in 10 years  . SKIN CANCER EXCISION     foot    Family History  Problem Relation Age of Onset  . Breast cancer Cousin        mat cousin  . Diabetes Sister   . Diabetes Son     Social History   Socioeconomic History  . Marital status: Married    Spouse name: Not on file  . Number of children: Not on file  . Years of education: Not on file  . Highest education level: Not on file  Occupational  History  . Not on file  Social Needs  . Financial resource strain: Not on file  . Food insecurity:    Worry: Not on file    Inability: Not on file  . Transportation needs:    Medical: Not on file    Non-medical: Not on file  Tobacco Use  . Smoking status: Never Smoker  . Smokeless tobacco: Never Used  Substance  and Sexual Activity  . Alcohol use: No    Alcohol/week: 0.0 oz  . Drug use: No  . Sexual activity: Not Currently  Lifestyle  . Physical activity:    Days per week: Not on file    Minutes per session: Not on file  . Stress: Not on file  Relationships  . Social connections:    Talks on phone: Not on file    Gets together: Not on file    Attends religious service: Not on file    Active member of club or organization: Not on file    Attends meetings of clubs or organizations: Not on file    Relationship status: Not on file  . Intimate partner violence:    Fear of current or ex partner: Not on file    Emotionally abused: Not on file    Physically abused: Not on file    Forced sexual activity: Not on file  Other Topics Concern  . Not on file  Social History Narrative  . Not on file    Allergies  Allergen Reactions  . Sulfa Antibiotics     Outpatient Medications Prior to Visit  Medication Sig Dispense Refill  . aspirin 81 MG tablet Take 81 mg by mouth daily.    . cetirizine (ZYRTEC) 10 MG tablet Take 1 tablet (10 mg total) by mouth daily. 30 tablet 11  . montelukast (SINGULAIR) 10 MG tablet Take 1 tablet (10 mg total) by mouth at bedtime. 30 tablet 11  . amLODipine (NORVASC) 5 MG tablet Take 1 tablet (5 mg total) by mouth daily. 90 tablet 1  . levothyroxine (SYNTHROID, LEVOTHROID) 25 MCG tablet TAKE ONE TABLET BY MOUTH ONCE DAILY BEFORE BREAKFAST 90 tablet 1  . losartan (COZAAR) 50 MG tablet Take 1 tablet (50 mg total) by mouth daily. 90 tablet 1  . pantoprazole (PROTONIX) 40 MG tablet Take 1 tablet (40 mg total) by mouth daily. 90 tablet 1  . etodolac (LODINE) 500 MG tablet Take 1 tablet (500 mg total) by mouth 2 (two) times daily. (Patient not taking: Reported on 12/14/2017) 60 tablet 11  . fluticasone (FLONASE) 50 MCG/ACT nasal spray Place 2 sprays into both nostrils daily. (Patient not taking: Reported on 08/29/2017) 16 g 6  . amoxicillin (AMOXIL) 500 MG capsule Take 1 capsule  (500 mg total) by mouth 3 (three) times daily. 30 capsule 0  . guaiFENesin-codeine (ROBITUSSIN AC) 100-10 MG/5ML syrup Take 5 mLs by mouth 3 (three) times daily as needed for cough. 150 mL 0   No facility-administered medications prior to visit.     Review of Systems  Constitutional: Negative for fatigue, malaise/fatigue, weight gain and weight loss.  HENT: Negative for hoarse voice and sore throat.   Eyes: Negative for blurred vision.  Respiratory: Negative for cough, choking, shortness of breath and wheezing.   Cardiovascular: Negative for chest pain, palpitations, orthopnea and PND.  Gastrointestinal: Negative for abdominal pain, constipation, dysphagia, heartburn, melena and nausea.  Musculoskeletal: Negative for myalgias, muscle weakness and neck pain.  Neurological: Negative for tremors, focal weakness and headaches.  Endo/Heme/Allergies: Negative for cold intolerance.     Objective  Vitals:   12/14/17 1013  BP: 122/86  Pulse: 72  Weight: 138 lb (62.6 kg)  Height: 5\' 6"  (1.676 m)    Physical Exam  Constitutional: She is oriented to person, place, and time. She appears well-developed and well-nourished.  HENT:  Head: Normocephalic.  Right Ear: External ear normal.  Left Ear: External ear normal.  Mouth/Throat: Oropharynx is clear and moist.  Eyes: Pupils are equal, round, and reactive to light. Conjunctivae and EOM are normal. Lids are everted and swept, no foreign bodies found. Left eye exhibits no hordeolum. No foreign body present in the left eye. Right conjunctiva is not injected. Left conjunctiva is not injected. No scleral icterus.  Neck: Normal range of motion. Neck supple. No JVD present. No tracheal deviation present. No thyromegaly present.  Cardiovascular: Normal rate, regular rhythm, normal heart sounds and intact distal pulses. Exam reveals no gallop and no friction rub.  No murmur heard. Pulmonary/Chest: Effort normal and breath sounds normal. No respiratory  distress. She has no wheezes. She has no rales.  Abdominal: Soft. Bowel sounds are normal. She exhibits no mass. There is no hepatosplenomegaly. There is no tenderness. There is no rebound and no guarding.  Musculoskeletal: Normal range of motion. She exhibits no edema or tenderness.  Lymphadenopathy:    She has no cervical adenopathy.  Neurological: She is alert and oriented to person, place, and time. She has normal strength. She displays normal reflexes. No cranial nerve deficit.  Skin: Skin is warm. No rash noted.  Psychiatric: She has a normal mood and affect. Her mood appears not anxious. She does not exhibit a depressed mood.  Nursing note and vitals reviewed.     Assessment & Plan  Problem List Items Addressed This Visit      Cardiovascular and Mediastinum   Essential hypertension - Primary    Controlled. Will continue Losartin 50 mg and Amlodipine 5 mg q day and check renal panel today.      Relevant Medications   losartan (COZAAR) 50 MG tablet   amLODipine (NORVASC) 5 MG tablet   Other Relevant Orders   Renal Function Panel     Digestive   Esophageal reflux    Controlled and will continue pantoprazole 40 mg q day.      Relevant Medications   pantoprazole (PROTONIX) 40 MG tablet     Endocrine   Hypothyroidism    Stable on current dose levothyroxin 25 mcgm q day and will do TSH to evaualte current status.      Relevant Medications   levothyroxine (SYNTHROID, LEVOTHROID) 25 MCG tablet   Other Relevant Orders   Thyroid Panel With TSH    Other Visit Diagnoses    Hypercholesterolemia       Relevant Medications   losartan (COZAAR) 50 MG tablet   amLODipine (NORVASC) 5 MG tablet   Other Relevant Orders   Lipid Panel With LDL/HDL Ratio   Colon cancer screening       discussed home testing screen per insurance      Meds ordered this encounter  Medications  . losartan (COZAAR) 50 MG tablet    Sig: Take 1 tablet (50 mg total) by mouth daily.    Dispense:   30 tablet    Refill:  7  . amLODipine (NORVASC) 5 MG tablet    Sig: Take 1 tablet (5 mg total) by mouth daily.    Dispense:  90 tablet  Refill:  1  . levothyroxine (SYNTHROID, LEVOTHROID) 25 MCG tablet    Sig: TAKE ONE TABLET BY MOUTH ONCE DAILY BEFORE BREAKFAST    Dispense:  90 tablet    Refill:  1  . pantoprazole (PROTONIX) 40 MG tablet    Sig: Take 1 tablet (40 mg total) by mouth daily.    Dispense:  90 tablet    Refill:  1      Dr. Otilio Miu [facility]  12/14/17

## 2017-12-14 NOTE — Assessment & Plan Note (Signed)
Controlled by diet and will do lipid to asset level of control.

## 2017-12-15 LAB — RENAL FUNCTION PANEL
ALBUMIN: 4.6 g/dL (ref 3.5–4.8)
BUN/Creatinine Ratio: 11 — ABNORMAL LOW (ref 12–28)
BUN: 9 mg/dL (ref 8–27)
CALCIUM: 10 mg/dL (ref 8.7–10.3)
CO2: 26 mmol/L (ref 20–29)
CREATININE: 0.81 mg/dL (ref 0.57–1.00)
Chloride: 97 mmol/L (ref 96–106)
GFR calc Af Amer: 84 mL/min/{1.73_m2} (ref >59)
GFR, EST NON AFRICAN AMERICAN: 73 mL/min/{1.73_m2} (ref >59)
Glucose: 79 mg/dL (ref 65–99)
Phosphorus: 4.1 mg/dL (ref 2.5–4.5)
Potassium: 4.5 mmol/L (ref 3.5–5.2)
SODIUM: 139 mmol/L (ref 134–144)

## 2017-12-15 LAB — THYROID PANEL WITH TSH
FREE THYROXINE INDEX: 1.4 (ref 1.2–4.9)
T3 UPTAKE RATIO: 19 % — AB (ref 24–39)
T4 TOTAL: 7.4 ug/dL (ref 4.5–12.0)
TSH: 2.69 u[IU]/mL (ref 0.450–4.500)

## 2017-12-15 LAB — LIPID PANEL WITH LDL/HDL RATIO
Cholesterol, Total: 211 mg/dL — ABNORMAL HIGH (ref 100–199)
HDL: 50 mg/dL (ref >39)
LDL CALC: 138 mg/dL — AB (ref 0–99)
LDl/HDL Ratio: 2.8 ratio (ref 0.0–3.2)
TRIGLYCERIDES: 113 mg/dL (ref 0–149)
VLDL Cholesterol Cal: 23 mg/dL (ref 5–40)

## 2018-04-17 DIAGNOSIS — Z23 Encounter for immunization: Secondary | ICD-10-CM | POA: Diagnosis not present

## 2018-07-26 ENCOUNTER — Encounter: Payer: Self-pay | Admitting: Family Medicine

## 2018-07-26 ENCOUNTER — Ambulatory Visit (INDEPENDENT_AMBULATORY_CARE_PROVIDER_SITE_OTHER): Payer: Medicare Other | Admitting: Family Medicine

## 2018-07-26 VITALS — BP 130/70 | HR 64 | Ht 66.0 in | Wt 140.0 lb

## 2018-07-26 DIAGNOSIS — K219 Gastro-esophageal reflux disease without esophagitis: Secondary | ICD-10-CM | POA: Diagnosis not present

## 2018-07-26 DIAGNOSIS — I10 Essential (primary) hypertension: Secondary | ICD-10-CM | POA: Diagnosis not present

## 2018-07-26 DIAGNOSIS — E032 Hypothyroidism due to medicaments and other exogenous substances: Secondary | ICD-10-CM | POA: Diagnosis not present

## 2018-07-26 MED ORDER — LEVOTHYROXINE SODIUM 25 MCG PO TABS: ORAL_TABLET | ORAL | 1 refills | Status: DC

## 2018-07-26 MED ORDER — PANTOPRAZOLE SODIUM 40 MG PO TBEC: 40.0000 mg | DELAYED_RELEASE_TABLET | Freq: Every day | ORAL | 1 refills | Status: DC

## 2018-07-26 MED ORDER — LOSARTAN POTASSIUM 50 MG PO TABS: 50.0000 mg | ORAL_TABLET | Freq: Every day | ORAL | 1 refills | Status: DC

## 2018-07-26 NOTE — Progress Notes (Signed)
Date:  07/26/2018   Name:  Victoria Rush   DOB:  [birth year]   MRN:  [id number]   Chief Complaint: Hypertension; Hypothyroidism; and Gastroesophageal Reflux  Hypertension  This is a chronic problem. The current episode started more than 1 year ago. The problem is unchanged. The problem is controlled. Pertinent negatives include no anxiety, blurred vision, chest pain, headaches, malaise/fatigue, neck pain, orthopnea, palpitations, peripheral edema, PND, shortness of breath or sweats. There are no associated agents to hypertension. Risk factors for coronary artery disease include dyslipidemia. Past treatments include angiotensin blockers. The current treatment provides moderate improvement. There are no compliance problems.  There is no history of angina, kidney disease, CAD/MI, CVA, heart failure, left ventricular hypertrophy, PVD or retinopathy. Identifiable causes of hypertension include a thyroid problem. There is no history of chronic renal disease, a hypertension causing med or renovascular disease.  Gastroesophageal Reflux  She reports no abdominal pain, no belching, no chest pain, no choking, no coughing, no dysphagia, no early satiety, no globus sensation, no heartburn, no hoarse voice, no nausea, no sore throat, no stridor or no wheezing. This is a new problem. The current episode started more than 1 year ago. The problem occurs frequently. The problem has been waxing and waning. The symptoms are aggravated by certain foods. Pertinent negatives include no anemia, fatigue, melena, muscle weakness, orthopnea or weight loss. There are no known risk factors. She has tried a PPI for the symptoms. The treatment provided mild relief. Past procedures do not include an abdominal ultrasound, an EGD, esophageal manometry, esophageal pH monitoring, H. pylori antibody titer or a UGI.  Hyperlipidemia  This is a chronic problem. The problem is controlled. Recent lipid tests were reviewed and are normal. She has  no history of chronic renal disease. Pertinent negatives include no chest pain, myalgias or shortness of breath. Current antihyperlipidemic treatment includes diet change and exercise. The current treatment provides moderate improvement of lipids. Risk factors for coronary artery disease include hypertension.  Thyroid Problem  Presents for follow-up visit. Patient reports no anxiety, cold intolerance, constipation, depressed mood, diaphoresis, diarrhea, dry skin, fatigue, hair loss, heat intolerance, hoarse voice, leg swelling, menstrual problem, nail problem, palpitations, tremors, visual change, weight gain or weight loss. The symptoms have been stable. Her past medical history is significant for hyperlipidemia. There is no history of heart failure.    Review of Systems  Constitutional: Negative.  Negative for chills, diaphoresis, fatigue, fever, malaise/fatigue, unexpected weight change, weight gain and weight loss.  HENT: Negative for congestion, ear discharge, ear pain, hoarse voice, rhinorrhea, sinus pressure, sneezing and sore throat.   Eyes: Negative for blurred vision, photophobia, pain, discharge, redness and itching.  Respiratory: Negative for cough, choking, shortness of breath, wheezing and stridor.   Cardiovascular: Negative for chest pain, palpitations, orthopnea and PND.  Gastrointestinal: Negative for abdominal pain, blood in stool, constipation, diarrhea, dysphagia, heartburn, melena, nausea and vomiting.  Endocrine: Negative for cold intolerance, heat intolerance, polydipsia, polyphagia and polyuria.  Genitourinary: Negative for dysuria, flank pain, frequency, hematuria, menstrual problem, pelvic pain, urgency, vaginal bleeding and vaginal discharge.  Musculoskeletal: Negative for arthralgias, back pain, myalgias, muscle weakness and neck pain.  Skin: Negative for rash.  Allergic/Immunologic: Negative for environmental allergies and food allergies.  Neurological: Negative for  dizziness, tremors, weakness, light-headedness, numbness and headaches.  Hematological: Negative for adenopathy. Does not bruise/bleed easily.  Psychiatric/Behavioral: Negative for dysphoric mood. The patient is not nervous/anxious.     Patient Active Problem List  Diagnosis Date Noted  . Quadriceps strain, right, initial encounter 08/23/2017  . Contact dermatitis 02/12/2016  . Esophageal reflux 02/12/2016  . Essential hypertension 02/12/2016  . Chronic seasonal allergic rhinitis due to pollen 12/04/2015  . 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 Hypercholesterolemia 01/30/2015    Allergies  Allergen Reactions  . Sulfa Antibiotics     Past Surgical History:  Procedure Laterality Date  . APPENDECTOMY    . COLONOSCOPY  2008   Dr Sonny Masters- repeat in 10 years  . SKIN CANCER EXCISION     foot    Social History   Tobacco Use  . Smoking status: Never Smoker  . Smokeless tobacco: Never Used  Substance Use Topics  . Alcohol use: No    Alcohol/week: 0.0 standard drinks  . Drug use: No     Medication list has been reviewed and updated.  Current Meds  Medication Sig  . amLODipine (NORVASC) 5 MG tablet Take 1 tablet (5 mg total) by mouth daily.  Marland Kitchen aspirin 81 MG tablet Take 81 mg by mouth daily.  . cetirizine (ZYRTEC) 10 MG tablet Take 1 tablet (10 mg total) by mouth daily.  Marland Kitchen levothyroxine (SYNTHROID, LEVOTHROID) 25 MCG tablet TAKE ONE TABLET BY MOUTH ONCE DAILY BEFORE BREAKFAST  . losartan (COZAAR) 50 MG tablet Take 1 tablet (50 mg total) by mouth daily.  . montelukast (SINGULAIR) 10 MG tablet Take 1 tablet (10 mg total) by mouth at bedtime.  . pantoprazole (PROTONIX) 40 MG tablet Take 1 tablet (40 mg total) by mouth daily.    PHQ 2/9 Scores 08/23/2017 08/23/2017 08/22/2016 01/13/2016  PHQ - 2 Score 0 0 0 0  PHQ- 9 Score 0 - - -    Physical Exam Vitals signs and nursing note reviewed.  Constitutional:      General: She is not in acute distress.    Appearance: She is not  diaphoretic.  HENT:     Head: Normocephalic and atraumatic.     Right Ear: External ear normal.     Left Ear: External ear normal.     Nose: Nose normal.  Eyes:     General:        Right eye: No discharge.        Left eye: No discharge.     Conjunctiva/sclera: Conjunctivae normal.     Pupils: Pupils are equal, round, and reactive to light.  Neck:     Musculoskeletal: Normal range of motion and neck supple.     Thyroid: No thyromegaly.     Vascular: No JVD.  Cardiovascular:     Rate and Rhythm: Normal rate and regular rhythm.     Pulses: Normal pulses.     Heart sounds: Normal heart sounds, S1 normal and S2 normal. Heart sounds not distant. No murmur. No systolic murmur. No diastolic murmur. No friction rub. No gallop. No S3 or S4 sounds.   Pulmonary:     Effort: Pulmonary effort is normal.     Breath sounds: Normal breath sounds. No wheezing or rhonchi.  Abdominal:     General: Bowel sounds are normal.     Palpations: Abdomen is soft. There is no mass.     Tenderness: There is no abdominal tenderness. There is no guarding.  Musculoskeletal: Normal range of motion.     Right lower leg: No edema.     Left lower leg: No edema.  Lymphadenopathy:     Cervical: No cervical adenopathy.  Skin:    General: Skin is warm and dry.  Neurological:     Mental Status: She is alert.     Deep Tendon Reflexes: Reflexes are normal and symmetric.     BP 130/70   Pulse 64   Ht 5\' 6"  (1.676 m)   Wt 140 lb (63.5 kg)   BMI 22.60 kg/m   Assessment and Plan: 1. 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  Controlled.  We can do TSH when we do her physical exam.  In the meantime continue levothyroxine 25 mcg 1 tablet in the morning. - levothyroxine (SYNTHROID, LEVOTHROID) 25 MCG tablet; TAKE ONE TABLET BY MOUTH ONCE DAILY BEFORE BREAKFAST  Dispense: 90 tablet; Refill: 1  2. Essential hypertension Chronic.  Controlled.  Patient was continued on losartan 50 mg once a  day.  She is returning for a physical within 2 months at which time we will do a renal function panel. - losartan (COZAAR) 50 MG tablet; Take 1 tablet (50 mg total) by mouth daily.  Dispense: 90 tablet; Refill: 1  3. Gastroesophageal reflux disease, esophagitis presence not specified Chronic.  Controlled.  Continue pantoprazole 40 mg once a day.  Patient had an episode of aspiration 1 night for which she is having to prop up. - pantoprazole (PROTONIX) 40 MG tablet; Take 1 tablet (40 mg total) by mouth daily.  Dispense: 90 tablet; Refill: 1

## 2018-08-28 ENCOUNTER — Ambulatory Visit (INDEPENDENT_AMBULATORY_CARE_PROVIDER_SITE_OTHER): Payer: Medicare Other | Admitting: Family Medicine

## 2018-08-28 ENCOUNTER — Encounter: Payer: Self-pay | Admitting: Family Medicine

## 2018-08-28 VITALS — BP 120/80 | HR 68 | Ht 66.0 in | Wt 139.0 lb

## 2018-08-28 DIAGNOSIS — E7800 Pure hypercholesterolemia, unspecified: Secondary | ICD-10-CM

## 2018-08-28 DIAGNOSIS — I10 Essential (primary) hypertension: Secondary | ICD-10-CM | POA: Diagnosis not present

## 2018-08-28 DIAGNOSIS — Z0000 Encounter for general adult medical examination without abnormal findings: Secondary | ICD-10-CM

## 2018-08-28 DIAGNOSIS — E032 Hypothyroidism due to medicaments and other exogenous substances: Secondary | ICD-10-CM | POA: Diagnosis not present

## 2018-08-28 DIAGNOSIS — J301 Allergic rhinitis due to pollen: Secondary | ICD-10-CM | POA: Diagnosis not present

## 2018-08-28 DIAGNOSIS — Z1231 Encounter for screening mammogram for malignant neoplasm of breast: Secondary | ICD-10-CM | POA: Diagnosis not present

## 2018-08-28 MED ORDER — MONTELUKAST SODIUM 10 MG PO TABS: 10.0000 mg | ORAL_TABLET | Freq: Every day | ORAL | 11 refills | Status: DC

## 2018-08-28 MED ORDER — AMLODIPINE BESYLATE 5 MG PO TABS: 5.0000 mg | ORAL_TABLET | Freq: Every day | ORAL | 1 refills | Status: DC

## 2018-08-28 NOTE — Progress Notes (Signed)
Date:  08/28/2018   Name:  Victoria Rush   DOB:  Jun 06, 1946   MRN:  [id number]   Chief Complaint: Annual Exam (needs mammo)  Patient is a 73 year old female who presents for a comprehensive physical exam. The patient reports the following problems: medication refill. Health maintenance has been reviewed will schedule mammogram.  Hypertension  This is a chronic problem. The current episode started more than 1 year ago. The problem is unchanged. The problem is controlled. Pertinent negatives include no chest pain, headaches, orthopnea, palpitations or shortness of breath. There are no associated agents to hypertension. There are no known risk factors for coronary artery disease. The current treatment provides moderate improvement. There are no compliance problems.  There is no history of angina, kidney disease, CAD/MI, CVA, heart failure, left ventricular hypertrophy, PVD or retinopathy. Identifiable causes of hypertension include a thyroid problem.  Thyroid Problem  Presents for follow-up visit. Symptoms include weight loss. Patient reports no anxiety, cold intolerance, constipation, depressed mood, diaphoresis, diarrhea, dry skin, fatigue, hair loss, heat intolerance, menstrual problem, palpitations or weight gain. (By design) There is no history of heart failure.  Gastroesophageal Reflux  She complains of heartburn. She reports no abdominal pain, no belching, no chest pain, no choking, no coughing, no dysphagia, no nausea, no sore throat or no wheezing. This is a recurrent problem. The heartburn is of mild intensity. The heartburn wakes (sleeps 2 pillows) her from sleep. The symptoms are aggravated by certain foods. Associated symptoms include weight loss. Pertinent negatives include no fatigue.    Review of Systems  Constitutional: Positive for weight loss. Negative for chills, diaphoresis, fatigue, fever, unexpected weight change and weight gain.  HENT: Negative for congestion, ear discharge,  ear pain, rhinorrhea, sinus pressure, sneezing and sore throat.   Eyes: Negative for photophobia, pain, discharge, redness and itching.  Respiratory: Negative for cough, choking, shortness of breath, wheezing and stridor.   Cardiovascular: Negative for chest pain, palpitations and orthopnea.  Gastrointestinal: Positive for heartburn. Negative for abdominal pain, blood in stool, constipation, diarrhea, dysphagia, nausea and vomiting.  Endocrine: Negative for cold intolerance, heat intolerance, polydipsia, polyphagia and polyuria.  Genitourinary: Negative for dysuria, flank pain, frequency, hematuria, menstrual problem, pelvic pain, urgency, vaginal bleeding and vaginal discharge.  Musculoskeletal: Negative for arthralgias, back pain and myalgias.  Skin: Negative for rash.  Allergic/Immunologic: Negative for environmental allergies and food allergies.  Neurological: Negative for dizziness, weakness, light-headedness, numbness and headaches.  Hematological: Negative for adenopathy. Does not bruise/bleed easily.  Psychiatric/Behavioral: Negative for dysphoric mood. The patient is not nervous/anxious.     Patient Active Problem List   Diagnosis Date Noted  . Quadriceps strain, right, initial encounter 08/23/2017  . Contact dermatitis 02/12/2016  . Esophageal reflux 02/12/2016  . Essential hypertension 02/12/2016  . Chronic seasonal allergic rhinitis due to pollen 12/04/2015  . 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 Hypercholesterolemia 01/30/2015    Allergies  Allergen Reactions  . Sulfa Antibiotics     Past Surgical History:  Procedure Laterality Date  . APPENDECTOMY    . COLONOSCOPY  2008   Dr Sonny Masters- repeat in 10 years  . SKIN CANCER EXCISION     foot    Social History   Tobacco Use  . Smoking status: Never Smoker  . Smokeless tobacco: Never Used  Substance Use Topics  . Alcohol use: No    Alcohol/week: 0.0 standard drinks  . Drug use: No     Medication list has been reviewed  and updated.  Current Meds  Medication Sig  . amLODipine (NORVASC) 5 MG tablet Take 1 tablet (5 mg total) by mouth daily.  Marland Kitchen aspirin 81 MG tablet Take 81 mg by mouth daily.  . cetirizine (ZYRTEC) 10 MG tablet Take 1 tablet (10 mg total) by mouth daily.  Marland Kitchen levothyroxine (SYNTHROID, LEVOTHROID) 25 MCG tablet TAKE ONE TABLET BY MOUTH ONCE DAILY BEFORE BREAKFAST  . losartan (COZAAR) 50 MG tablet Take 1 tablet (50 mg total) by mouth daily.  . montelukast (SINGULAIR) 10 MG tablet Take 1 tablet (10 mg total) by mouth at bedtime.  . pantoprazole (PROTONIX) 40 MG tablet Take 1 tablet (40 mg total) by mouth daily.    PHQ 2/9 Scores 08/23/2017 08/23/2017 08/22/2016 01/13/2016  PHQ - 2 Score 0 0 0 0  PHQ- 9 Score 0 - - -    Physical Exam Vitals signs and nursing note reviewed.  Constitutional:      General: She is not in acute distress.    Appearance: Normal appearance. She is well-developed and normal weight. She is not diaphoretic.  HENT:     Head: Normocephalic and atraumatic. No masses.     Jaw: There is normal jaw occlusion.     Salivary Glands: Right salivary gland is not diffusely enlarged. Left salivary gland is not diffusely enlarged.     Right Ear: Tympanic membrane, ear canal and external ear normal. Decreased hearing noted.     Left Ear: Tympanic membrane, ear canal and external ear normal. Decreased hearing noted.     Nose: Nose normal. No nasal deformity.     Mouth/Throat:     Lips: Pink.     Mouth: Mucous membranes are moist.     Dentition: Normal dentition.     Palate: No mass.  Eyes:     General: Lids are normal. No allergic shiner or visual field deficit.       Right eye: No discharge.        Left eye: No discharge.     Conjunctiva/sclera: Conjunctivae normal.     Pupils: Pupils are equal, round, and reactive to light. Pupils are equal.     Funduscopic exam:    Right eye: No AV nicking.        Left eye: No AV nicking.  Neck:     Musculoskeletal: Normal range of motion  and neck supple. No edema.     Thyroid: No thyroid mass or thyromegaly.     Vascular: No JVD.  Cardiovascular:     Rate and Rhythm: Normal rate and regular rhythm.     Chest Wall: PMI is not displaced. No thrill.     Pulses: Normal pulses.          Carotid pulses are 2+ on the right side with bruit and 2+ on the left side with bruit.      Radial pulses are 2+ on the right side and 2+ on the left side.       Femoral pulses are 2+ on the right side and 2+ on the left side.      Popliteal pulses are 2+ on the right side and 2+ on the left side.       Dorsalis pedis pulses are 2+ on the right side and 2+ on the left side.       Posterior tibial pulses are 2+ on the right side and 2+ on the left side.     Heart sounds: Normal heart sounds, S1 normal and S2  normal. No murmur. No systolic murmur. No diastolic murmur. No friction rub. No gallop. No S3 or S4 sounds.   Pulmonary:     Effort: Pulmonary effort is normal.     Breath sounds: Normal breath sounds. No transmitted upper airway sounds. No decreased breath sounds, wheezing, rhonchi or rales.  Chest:     Chest wall: No mass.     Breasts:        Right: Normal. No swelling, bleeding, inverted nipple, mass, nipple discharge, skin change or tenderness.        Left: Normal. No swelling, bleeding, inverted nipple, mass, nipple discharge, skin change or tenderness.  Abdominal:     General: Abdomen is flat. Bowel sounds are normal.     Palpations: Abdomen is soft. There is no hepatomegaly, splenomegaly or mass.     Tenderness: There is no abdominal tenderness. There is no guarding.     Hernia: No hernia is present.  Musculoskeletal: Normal range of motion.     Lumbar back: Normal.     Right lower leg: No edema.     Left lower leg: No edema.  Lymphadenopathy:     Head:     Right side of head: No submental or submandibular adenopathy.     Left side of head: No submental or submandibular adenopathy.     Cervical: No cervical adenopathy.      Right cervical: No superficial cervical adenopathy.    Left cervical: No superficial cervical adenopathy.  Skin:    General: Skin is warm and dry.     Comments: Seborrheic keratoses  Neurological:     Mental Status: She is alert and oriented to person, place, and time.     Cranial Nerves: Cranial nerves are intact.     Sensory: Sensation is intact.     Motor: Motor function is intact.     Coordination: Coordination is intact.     Deep Tendon Reflexes: Reflexes are normal and symmetric.  Psychiatric:        Behavior: Behavior is cooperative.     BP 120/80   Pulse 68   Ht 5\' 6"  (1.676 m)   Wt 139 lb (63 kg)   BMI 22.44 kg/m   Assessment and Plan: 1. Breast cancer screening by mammogram Discussed and breast exam was unremarkable for any breast masses patient will be scheduled for 3D mammogram. - MM 3D SCREEN BREAST BILATERAL; Future  2. Chronic seasonal allergic rhinitis due to pollen Patient has chronic seasonal allergic rhinitis for which we will continue Singulair 10 mg nightly. - montelukast (SINGULAIR) 10 MG tablet; Take 1 tablet (10 mg total) by mouth at bedtime.  Dispense: 30 tablet; Refill: 11  3. Annual physical exam No subjective objective concerns noted on history or physical.  Review of past visits, labs, imaging, and consultations.Victoria Rush is a 73 y.o. female who presents today for her Complete Annual Exam. She feels well. She reports exercising daily/walking. She reports she is sleeping well.   4. Essential hypertension Chronic.  Controlled.  Continue amlodipine 5 mg once a day and will check renal function panel. - amLODipine (NORVASC) 5 MG tablet; Take 1 tablet (5 mg total) by mouth daily.  Dispense: 90 tablet; Refill: 1 - Renal Function Panel  5. Pure hypercholesterolemia Noted to have elevated LDL in the past.  This is diet controlled in the past.  We will check a lipid panel to evaluate current status. - Lipid panel  6. 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  Will check TSH and adjust accordingly. - TSH

## 2018-08-29 LAB — LIPID PANEL
Chol/HDL Ratio: 3.2 ratio (ref 0.0–4.4)
Cholesterol, Total: 152 mg/dL (ref 100–199)
HDL: 47 mg/dL (ref >39)
LDL Calculated: 90 mg/dL (ref 0–99)
Triglycerides: 75 mg/dL (ref 0–149)
VLDL Cholesterol Cal: 15 mg/dL (ref 5–40)

## 2018-08-29 LAB — TSH: TSH: 2.35 u[IU]/mL (ref 0.450–4.500)

## 2018-08-29 LAB — RENAL FUNCTION PANEL
Albumin: 4.5 g/dL (ref 3.7–4.7)
BUN / CREAT RATIO: 11 — AB (ref 12–28)
BUN: 9 mg/dL (ref 8–27)
CO2: 26 mmol/L (ref 20–29)
Calcium: 9.7 mg/dL (ref 8.7–10.3)
Chloride: 101 mmol/L (ref 96–106)
Creatinine, Ser: 0.81 mg/dL (ref 0.57–1.00)
GFR calc Af Amer: 83 mL/min/{1.73_m2} (ref >59)
GFR calc non Af Amer: 72 mL/min/{1.73_m2} (ref >59)
Glucose: 93 mg/dL (ref 65–99)
Phosphorus: 3.8 mg/dL (ref 3.0–4.3)
Potassium: 4.3 mmol/L (ref 3.5–5.2)
Sodium: 140 mmol/L (ref 134–144)

## 2018-09-06 ENCOUNTER — Ambulatory Visit
Admission: RE | Admit: 2018-09-06 | Discharge: 2018-09-06 | Disposition: A | Discharge status: Home / Self Care | Payer: Medicare Other | Source: Ambulatory Visit | Attending: Family Medicine | Admitting: Family Medicine

## 2018-09-06 DIAGNOSIS — Z1231 Encounter for screening mammogram for malignant neoplasm of breast: Secondary | ICD-10-CM | POA: Insufficient documentation

## 2018-09-13 DIAGNOSIS — H25013 Cortical age-related cataract, bilateral: Secondary | ICD-10-CM | POA: Diagnosis not present

## 2018-12-13 ENCOUNTER — Other Ambulatory Visit: Payer: Self-pay

## 2018-12-13 ENCOUNTER — Ambulatory Visit (INDEPENDENT_AMBULATORY_CARE_PROVIDER_SITE_OTHER): Payer: Medicare Other | Admitting: Family Medicine

## 2018-12-13 ENCOUNTER — Encounter: Payer: Self-pay | Admitting: Family Medicine

## 2018-12-13 VITALS — BP 132/78 | HR 72 | Ht 66.0 in | Wt 135.0 lb

## 2018-12-13 DIAGNOSIS — M5116 Intervertebral disc disorders with radiculopathy, lumbar region: Secondary | ICD-10-CM | POA: Diagnosis not present

## 2018-12-13 MED ORDER — ETODOLAC 500 MG PO TABS: 500.0000 mg | ORAL_TABLET | Freq: Two times a day (BID) | ORAL | 3 refills | Status: DC

## 2018-12-13 MED ORDER — PREDNISONE 10 MG PO TABS: 10.0000 mg | ORAL_TABLET | Freq: Every day | ORAL | 0 refills | Status: DC

## 2018-12-13 MED ORDER — TRAMADOL HCL 50 MG PO TABS: 50.0000 mg | ORAL_TABLET | Freq: Four times a day (QID) | ORAL | 0 refills | Status: AC

## 2018-12-13 NOTE — Patient Instructions (Signed)
Herniated Disk  A herniated disk, also called a ruptured disk or slipped disk, occurs when a disk in the spine bulges out too far. Between the bones in the spine (vertebrae), there are oval disks that are made of a soft, spongy center that is surrounded by a tough outer ring. The disks connect your vertebrae, help your spine move, and absorb shocks from your movement. When you have a herniated disk, the spongy center of the disk bulges out or breaks through the outer ring. It can press on a nerve between the vertebrae and cause pain. This can occur anywhere in the back or neck area, but the lower back is most commonly affected. What are the causes? This condition may be caused by:  Age-related wear and tear. The spongy centers of spinal disks tend to shrink and dry out with age, which makes them more likely to herniate.  Sudden injury, such as a strain or sprain. What increases the risk? Aging is the main risk factor for a herniated disk. Other risk factors include:  Being a man who is 35-51 years old.  Frequently doing activities that involve heavy lifting, bending, or twisting.  Frequently driving for long hours at a time.  Not getting enough exercise.  Being overweight.  Smoking.  Having a family history of back problems or herniated disks.  Being pregnant or giving birth.  Having poor nutrition.  Being tall. What are the signs or symptoms? Symptoms may vary depending on where your herniated disk is located.  A herniated disk in the lower back may cause sharp pain in: ? Part of the arm, leg, hip, or buttocks. ? The back of the lower leg (calf). ? The lower back, spreading down through the leg into the foot (sciatica).  A herniated disk in the neck may cause dizziness and vertigo. It may also cause pain or weakness in: ? The neck. ? The shoulder blades. ? Upper arm, forearm, or fingers.  You may also have muscle weakness. It may be difficult to: ? Lift your leg or arm.  ? Stand on your toes. ? Squeeze tightly with one of your hands.  Other symptoms may include: ? Numbness or tingling in the affected areas of the hands, arms, feet, or legs. ? Inability to control when you urinate or when you have bowel movements. This is a rare but serious sign of a severe herniated disk in the lower back. How is this diagnosed? This condition may be diagnosed based on:  Your symptoms.  Your medical history.  A physical exam. The exam may include: ? Straight-leg test. You will lie on your back while your health care provider lifts your leg, keeping your knee straight. If you feel pain, you likely have a herniated disk. ? Neurological tests. This includes checking for numbness, reflexes, muscle strength, and posture.  Imaging tests, such as: ? X-rays. ? MRI. ? CT scan. ? Electromyogram (EMG) to check the nerves that control muscles. This test may be used to determine which nerves are affected by your herniated disk. How is this treated? Treatment for this condition may include:  A short period of rest. This is usually the first treatment. ? You may be on bed rest for up to 2 days, or you may be instructed to stay home and avoid physical activity. ? If you have a herniated disk in your lower back, avoid sitting as much as possible. Sitting increases pressure on the disk.  Medicines. These may include: ? NSAIDs  to help reduce pain and swelling. ? Muscle relaxants to prevent sudden tightening of the back muscles (back spasms). ? Prescription pain medicines, if you have severe pain.  Steroid injections in the area of the herniated disk. This can help reduce pain and swelling.  Physical therapy to strengthen your back muscles. In many cases, symptoms go away with treatment over a period of days or weeks. You will most likely be free of symptoms after 3-4 months. If other treatments do not help to relieve your symptoms, you may need surgery. Follow these instructions  at home: Medicines  Take over-the-counter and prescription medicines only as told by your health care provider.  Do not drive or use heavy machinery while taking prescription pain medicine. Activity  Rest as directed.  After your rest period: ? Return to your normal activities and gradually begin exercising as told by your health care provider. Ask your health care provider what activities and exercises are safe for you. ? Use good posture. ? Avoid movements that cause pain. ? Do not lift anything that is heavier than 10 lb (4.5 kg) until your health care provider says this is safe. ? Do not sit or stand for long periods of time without changing positions. ? Do not sit for long periods of time without getting up and moving around.  If physical therapy was prescribed, do exercises as instructed.  Aim to strengthen muscles in your back and abdomen with exercises like crunches, swimming, or walking. General instructions  Do not use any products that contain nicotine or tobacco, such as cigarettes and e-cigarettes. These products can delay healing. If you need help quitting, ask your health care provider.  Do not wear high-heeled shoes.  Do not sleep on your belly.  If you are overweight, work with your health care provider to lose weight safely.  To prevent or treat constipation while you are taking prescription pain medicine, your health care provider may recommend that you: ? Drink enough fluid to keep your urine clear or pale yellow. ? Take over-the-counter or prescription medicines. ? Eat foods that are high in fiber, such as fresh fruits and vegetables, whole grains, and beans. ? Limit foods that are high in fat and processed sugars, such as fried and sweet foods.  Keep all follow-up visits as told by your health care provider. This is important. How is this prevented?   Maintain a healthy weight.  Try to avoid stressful situations.  Maintain physical fitness. Do at  least 150 minutes of moderate-intensity exercise each week, such as brisk walking or water aerobics.  When lifting objects: ? Keep your feet at least shoulder-width apart and tighten your abdominal muscles. ? Keep your spine neutral as you bend your knees and hips. It is important to lift using the strength of your legs, not your back. Do not lock your knees straight out. ? Always ask for help to lift heavy or awkward objects. Contact a health care provider if:  You have back pain or neck pain that does not get better after 6 weeks.  You have severe pain in your back, neck, legs, or arms.  You develop numbness, tingling, or weakness in any part of your body. Get help right away if:  You cannot move your arms or legs.  You cannot control when you urinate or have bowel movements.  You feel dizzy or you faint.  You have shortness of breath. This information is not intended to replace advice given to you by   your health care provider. Make sure you discuss any questions you have with your health care provider. Document Released: 06/17/2000 Document Revised: 02/15/2016 Document Reviewed: 02/15/2016 Elsevier Interactive Patient Education  2019 [employer]  

## 2018-12-13 NOTE — Progress Notes (Signed)
Date:  12/13/2018   Name:  Victoria Rush   DOB:  August 14, 1945   MRN:  [id number]   Chief Complaint: Back Pain (taking Etodolac for back pain radiating down R) leg- helping some, but has some tingling associated with the pain- started after moving furniture last week)  Back Pain This is a new problem. The current episode started in the past 7 days (8 days ago/after moving furniture). The problem occurs constantly. The problem has been waxing and waning since onset. The pain is present in the lumbar spine. The quality of the pain is described as aching. The pain radiates to the left foot, left knee and left thigh. The pain is at a severity of 6/10. The pain is moderate. The symptoms are aggravated by bending and twisting (lifting). Associated symptoms include leg pain and tingling. Pertinent negatives include no abdominal pain, bladder incontinence, bowel incontinence, chest pain, dysuria, fever, headaches, numbness, paresis, paresthesias, pelvic pain, weakness or weight loss. She has tried NSAIDs (etodolac) for the symptoms. The treatment provided moderate relief.    Review of Systems  Constitutional: Negative.  Negative for chills, fatigue, fever, unexpected weight change and weight loss.  HENT: Negative for congestion, ear discharge, ear pain, rhinorrhea, sinus pressure, sneezing and sore throat.   Eyes: Negative for photophobia, pain, discharge, redness and itching.  Respiratory: Negative for cough, shortness of breath, wheezing and stridor.   Cardiovascular: Negative for chest pain.  Gastrointestinal: Negative for abdominal pain, blood in stool, bowel incontinence, constipation, diarrhea, nausea and vomiting.  Endocrine: Negative for cold intolerance, heat intolerance, polydipsia, polyphagia and polyuria.  Genitourinary: Negative for bladder incontinence, dysuria, flank pain, frequency, hematuria, menstrual problem, pelvic pain, urgency, vaginal bleeding and vaginal discharge.  Musculoskeletal:  Positive for back pain. Negative for arthralgias and myalgias.  Skin: Negative for rash.  Allergic/Immunologic: Negative for environmental allergies and food allergies.  Neurological: Positive for tingling. Negative for dizziness, weakness, light-headedness, numbness, headaches and paresthesias.  Hematological: Negative for adenopathy. Does not bruise/bleed easily.  Psychiatric/Behavioral: Negative for dysphoric mood. The patient is not nervous/anxious.     Patient Active Problem List   Diagnosis Date Noted  . Quadriceps strain, right, initial encounter 08/23/2017  . Contact dermatitis 02/12/2016  . Esophageal reflux 02/12/2016  . Essential hypertension 02/12/2016  . Chronic seasonal allergic rhinitis due to pollen 12/04/2015  . 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 Hypercholesterolemia 01/30/2015    Allergies  Allergen Reactions  . Sulfa Antibiotics     Past Surgical History:  Procedure Laterality Date  . APPENDECTOMY    . COLONOSCOPY  2008   Dr Sonny Masters- repeat in 10 years  . SKIN CANCER EXCISION     foot    Social History   Tobacco Use  . Smoking status: Never Smoker  . Smokeless tobacco: Never Used  Substance Use Topics  . Alcohol use: No    Alcohol/week: 0.0 standard drinks  . Drug use: No     Medication list has been reviewed and updated.  No outpatient medications have been marked as taking for the 12/13/18 encounter (Office Visit) with Juline Patch, MD.    [facility] 2/9 Scores 12/13/2018 08/23/2017 08/23/2017 08/22/2016  PHQ - 2 Score 0 0 0 0  PHQ- 9 Score 0 0 - -    BP Readings from Last 3 Encounters:  12/13/18 132/78  08/28/18 120/80  07/26/18 130/70    Physical Exam Vitals signs and nursing note reviewed.  Constitutional:      Appearance: She is  well-developed.  HENT:     Head: Normocephalic.     Right Ear: External ear normal.     Left Ear: External ear normal.  Eyes:     General: Lids are everted, no foreign bodies appreciated. No scleral icterus.        Left eye: No foreign body or hordeolum.     Conjunctiva/sclera: Conjunctivae normal.     Right eye: Right conjunctiva is not injected.     Left eye: Left conjunctiva is not injected.     Pupils: Pupils are equal, round, and reactive to light.  Neck:     Musculoskeletal: Normal range of motion and neck supple.     Thyroid: No thyromegaly.     Vascular: No JVD.     Trachea: No tracheal deviation.  Cardiovascular:     Rate and Rhythm: Normal rate and regular rhythm.     Heart sounds: Normal heart sounds. No murmur. No friction rub. No gallop.   Pulmonary:     Effort: Pulmonary effort is normal. No respiratory distress.     Breath sounds: Normal breath sounds. No wheezing or rales.  Abdominal:     General: Bowel sounds are normal.     Palpations: Abdomen is soft. There is no mass.     Tenderness: There is no abdominal tenderness. There is no guarding or rebound.  Musculoskeletal: Normal range of motion.        General: No tenderness.     Lumbar back: She exhibits spasm. She exhibits no tenderness and no bony tenderness.     Comments: No SLR  Lymphadenopathy:     Cervical: No cervical adenopathy.  Skin:    General: Skin is warm.     Findings: No rash.  Neurological:     Mental Status: She is alert and oriented to person, place, and time.     Cranial Nerves: No cranial nerve deficit.     Sensory: Sensation is intact.     Motor: Motor function is intact.     Deep Tendon Reflexes: Reflexes are normal and symmetric. Reflexes normal.     Reflex Scores:      Patellar reflexes are 2+ on the right side and 2+ on the left side.      Achilles reflexes are 2+ on the right side and 2+ on the left side.    Comments: No SLR  Psychiatric:        Mood and Affect: Mood is not anxious or depressed.     Wt Readings from Last 3 Encounters:  12/13/18 135 lb (61.2 kg)  08/28/18 139 lb (63 kg)  07/26/18 140 lb (63.5 kg)    BP 132/78   Pulse 72   Ht 5\' 6"  (1.676 m)   Wt 135 lb (61.2 kg)    BMI 21.79 kg/m   Assessment and Plan: 1. 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  Persistent for 1 week.  Controlled to a certain degree with etodolac 500 mg twice a day.  Patient likely has a herniated disc with radiculopathy.  Have explained that she can only take 2 of the Lodine a day with meals.  I have added prednisone 10 mg 1 a day.  And tramadol 50 mg every 6 hours as needed if the pain is breaking through the etodolac.  Patient is to call if pain continues. - etodolac (LODINE) 500 MG tablet; Take 1 tablet (500 mg total) by mouth 2 (two) times daily.  Dispense: 60 tablet; Refill: 3 -  traMADol (ULTRAM) 50 MG tablet; Take 1 tablet (50 mg total) by mouth 4 (four) times daily for 5 days.  Dispense: 20 tablet; Refill: 0 - predniSONE (DELTASONE) 10 MG tablet; Take 1 tablet (10 mg total) by mouth daily with breakfast.  Dispense: 30 tablet; Refill: 0

## 2019-01-16 ENCOUNTER — Other Ambulatory Visit: Payer: Self-pay

## 2019-01-16 ENCOUNTER — Encounter: Payer: Self-pay | Admitting: Family Medicine

## 2019-01-16 ENCOUNTER — Ambulatory Visit (INDEPENDENT_AMBULATORY_CARE_PROVIDER_SITE_OTHER): Payer: Medicare Other | Admitting: Family Medicine

## 2019-01-16 VITALS — BP 138/70 | HR 80 | Ht 69.0 in | Wt 136.0 lb

## 2019-01-16 DIAGNOSIS — E032 Hypothyroidism due to medicaments and other exogenous substances: Secondary | ICD-10-CM | POA: Diagnosis not present

## 2019-01-16 DIAGNOSIS — K219 Gastro-esophageal reflux disease without esophagitis: Secondary | ICD-10-CM

## 2019-01-16 DIAGNOSIS — S39012D Strain of muscle, fascia and tendon of lower back, subsequent encounter: Secondary | ICD-10-CM

## 2019-01-16 MED ORDER — PANTOPRAZOLE SODIUM 40 MG PO TBEC: 40.0000 mg | DELAYED_RELEASE_TABLET | Freq: Every day | ORAL | 1 refills | Status: DC

## 2019-01-16 MED ORDER — LEVOTHYROXINE SODIUM 25 MCG PO TABS: ORAL_TABLET | ORAL | 1 refills | Status: DC

## 2019-01-16 NOTE — Progress Notes (Signed)
Date:  01/16/2019   Name:  Victoria Rush   DOB:  [birth year]   MRN:  [id number]   Chief Complaint: Hypothyroidism and Gastroesophageal Reflux  Gastroesophageal Reflux She reports no abdominal pain, no belching, no chest pain, no choking, no coughing, no dysphagia, no early satiety, no globus sensation, no heartburn, no hoarse voice, no nausea, no sore throat, no stridor, no tooth decay, no water brash or no wheezing. This is a recurrent problem. The current episode started more than 1 year ago. The problem occurs occasionally. The symptoms are aggravated by certain foods (hamburger and gravy). Pertinent negatives include no anemia, fatigue, melena, muscle weakness, orthopnea or weight loss. There are no known risk factors. She has tried a PPI for the symptoms. The treatment provided moderate relief.  Thyroid Problem Presents for follow-up visit. Symptoms include weight gain. Patient reports no anxiety, cold intolerance, constipation, depressed mood, diaphoresis, diarrhea, dry skin, fatigue, hair loss, heat intolerance, hoarse voice, leg swelling, menstrual problem, nail problem, palpitations, tremors, visual change or weight loss. (" couple of pounfs") The symptoms have been stable.  Hip Pain  Incident onset: last of May. There was no injury mechanism (moving furniture). Pain location: right pelvis. The pain is at a severity of 5/10. The pain is moderate. The pain has been fluctuating since onset. Pertinent negatives include no inability to bear weight, loss of motion, loss of sensation, muscle weakness, numbness or tingling. The symptoms are aggravated by movement.    Review of Systems  Constitutional: Positive for weight gain. Negative for diaphoresis, fatigue and weight loss.  HENT: Negative for hoarse voice and sore throat.   Respiratory: Negative for cough, choking and wheezing.   Cardiovascular: Negative for chest pain and palpitations.  Gastrointestinal: Negative for abdominal pain,  constipation, diarrhea, dysphagia, heartburn, melena and nausea.  Endocrine: Negative for cold intolerance and heat intolerance.  Genitourinary: Negative for menstrual problem.  Musculoskeletal: Negative for muscle weakness.  Neurological: Negative for tingling, tremors and numbness.  Psychiatric/Behavioral: The patient is not nervous/anxious.     Patient Active Problem List   Diagnosis Date Noted  . Quadriceps strain, right, initial encounter 08/23/2017  . Contact dermatitis 02/12/2016  . Esophageal reflux 02/12/2016  . Essential hypertension 02/12/2016  . Chronic seasonal allergic rhinitis due to pollen 12/04/2015  . 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 Hypercholesterolemia 01/30/2015    Allergies  Allergen Reactions  . Sulfa Antibiotics     Past Surgical History:  Procedure Laterality Date  . APPENDECTOMY    . COLONOSCOPY  2008   Dr Sonny Masters- repeat in 10 years  . SKIN CANCER EXCISION     foot    Social History   Tobacco Use  . Smoking status: Never Smoker  . Smokeless tobacco: Never Used  Substance Use Topics  . Alcohol use: No    Alcohol/week: 0.0 standard drinks  . Drug use: No     Medication list has been reviewed and updated.  No outpatient medications have been marked as taking for the 01/16/19 encounter (Office Visit) with Juline Patch, MD.    [facility] 2/9 Scores 01/16/2019 12/13/2018 08/23/2017 08/23/2017  PHQ - 2 Score 0 0 0 0  PHQ- 9 Score 0 0 0 -    BP Readings from Last 3 Encounters:  01/16/19 138/70  12/13/18 132/78  08/28/18 120/80    Physical Exam Vitals signs and nursing note reviewed.  Constitutional:      Appearance: She is well-developed.  HENT:     Head:  Normocephalic.     Right Ear: Tympanic membrane, ear canal and external ear normal.     Left Ear: Tympanic membrane, ear canal and external ear normal.     Nose: Nose normal.     Mouth/Throat:     Mouth: Mucous membranes are moist.     Pharynx: Oropharynx is clear.  Eyes:     General:  Lids are everted, no foreign bodies appreciated. No scleral icterus.       Left eye: No foreign body or hordeolum.     Conjunctiva/sclera: Conjunctivae normal.     Right eye: Right conjunctiva is not injected.     Left eye: Left conjunctiva is not injected.     Pupils: Pupils are equal, round, and reactive to light.  Neck:     Musculoskeletal: Normal range of motion and neck supple.     Thyroid: No thyromegaly.     Vascular: No JVD.     Trachea: No tracheal deviation.  Cardiovascular:     Rate and Rhythm: Normal rate and regular rhythm.     Pulses: Normal pulses.     Heart sounds: Normal heart sounds. No murmur. No friction rub. No gallop.   Pulmonary:     Effort: Pulmonary effort is normal. No respiratory distress.     Breath sounds: Normal breath sounds. No wheezing or rales.  Abdominal:     General: Bowel sounds are normal.     Palpations: Abdomen is soft. There is no mass.     Tenderness: There is no abdominal tenderness. There is no guarding or rebound.  Musculoskeletal: Normal range of motion.     Lumbar back: She exhibits tenderness and spasm. She exhibits normal range of motion, no bony tenderness, no swelling, no edema and no deformity.     Comments: Tender along right sacroiliac area/paraspinal spasm  Lymphadenopathy:     Cervical: No cervical adenopathy.  Skin:    General: Skin is warm.     Findings: No rash.  Neurological:     Mental Status: She is alert and oriented to person, place, and time.     Cranial Nerves: No cranial nerve deficit.     Deep Tendon Reflexes: Reflexes normal.  Psychiatric:        Mood and Affect: Mood is not anxious or depressed.     Wt Readings from Last 3 Encounters:  01/16/19 136 lb (61.7 kg)  12/13/18 135 lb (61.2 kg)  08/28/18 139 lb (63 kg)    BP 138/70   Pulse 80   Ht 5\' 9"  (1.753 m)   Wt 136 lb (61.7 kg)   BMI 20.08 kg/m   Assessment and Plan: 1. Gastroesophageal reflux disease, esophagitis presence not specified  Chronic.  Controlled.  Continue pantoprazole 40 mg once a day. - pantoprazole (PROTONIX) 40 MG tablet; Take 1 tablet (40 mg total) by mouth daily.  Dispense: 90 tablet; Refill: 1  2. 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  Controlled.  Review of TSH from February is in acceptable range we will continue at 25 mcg of levothyroxine once a day. - levothyroxine (SYNTHROID) 25 MCG tablet; TAKE ONE TABLET BY MOUTH ONCE DAILY BEFORE BREAKFAST  Dispense: 90 tablet; Refill: 1  3. Sacroiliac strain, subsequent encounter Patient continues to have pain in the right sacroiliac area versus lumbar area referred to the sciatic area.  Patient does have paraspinal spasm with mild tenderness over the sacroiliac joint on the right.  This might be residual from an injury from  moving furniture to either the aspects.  There is no been no continue resolution on prednisone so we will refer to orthopedics for evaluation and treatment. - Ambulatory referral to Orthopedic Surgery

## 2019-01-18 DIAGNOSIS — M5441 Lumbago with sciatica, right side: Secondary | ICD-10-CM | POA: Diagnosis not present

## 2019-01-18 DIAGNOSIS — M5136 Other intervertebral disc degeneration, lumbar region: Secondary | ICD-10-CM | POA: Diagnosis not present

## 2019-01-18 DIAGNOSIS — M545 Low back pain: Secondary | ICD-10-CM | POA: Diagnosis not present

## 2019-01-18 DIAGNOSIS — M5416 Radiculopathy, lumbar region: Secondary | ICD-10-CM | POA: Diagnosis not present

## 2019-01-23 DIAGNOSIS — M5441 Lumbago with sciatica, right side: Secondary | ICD-10-CM | POA: Diagnosis not present

## 2019-01-23 DIAGNOSIS — M6281 Muscle weakness (generalized): Secondary | ICD-10-CM | POA: Diagnosis not present

## 2019-01-30 DIAGNOSIS — M5441 Lumbago with sciatica, right side: Secondary | ICD-10-CM | POA: Diagnosis not present

## 2019-01-30 DIAGNOSIS — M6281 Muscle weakness (generalized): Secondary | ICD-10-CM | POA: Diagnosis not present

## 2019-02-06 DIAGNOSIS — M5441 Lumbago with sciatica, right side: Secondary | ICD-10-CM | POA: Diagnosis not present

## 2019-02-06 DIAGNOSIS — M6281 Muscle weakness (generalized): Secondary | ICD-10-CM | POA: Diagnosis not present

## 2019-02-13 ENCOUNTER — Other Ambulatory Visit: Payer: Self-pay | Admitting: Family Medicine

## 2019-02-13 DIAGNOSIS — I10 Essential (primary) hypertension: Secondary | ICD-10-CM

## 2019-03-12 DIAGNOSIS — L578 Other skin changes due to chronic exposure to nonionizing radiation: Secondary | ICD-10-CM | POA: Diagnosis not present

## 2019-03-12 DIAGNOSIS — D485 Neoplasm of uncertain behavior of skin: Secondary | ICD-10-CM | POA: Diagnosis not present

## 2019-04-04 DIAGNOSIS — C4490 Unspecified malignant neoplasm of skin, unspecified: Secondary | ICD-10-CM

## 2019-04-04 HISTORY — DX: Unspecified malignant neoplasm of skin, unspecified: C44.90

## 2019-04-11 ENCOUNTER — Other Ambulatory Visit: Payer: Self-pay | Admitting: Family Medicine

## 2019-04-11 DIAGNOSIS — I10 Essential (primary) hypertension: Secondary | ICD-10-CM

## 2019-04-25 DIAGNOSIS — L988 Other specified disorders of the skin and subcutaneous tissue: Secondary | ICD-10-CM | POA: Diagnosis not present

## 2019-04-25 DIAGNOSIS — L578 Other skin changes due to chronic exposure to nonionizing radiation: Secondary | ICD-10-CM | POA: Diagnosis not present

## 2019-04-25 DIAGNOSIS — M950 Acquired deformity of nose: Secondary | ICD-10-CM | POA: Diagnosis not present

## 2019-04-25 DIAGNOSIS — C44311 Basal cell carcinoma of skin of nose: Secondary | ICD-10-CM | POA: Diagnosis not present

## 2019-07-11 ENCOUNTER — Ambulatory Visit (INDEPENDENT_AMBULATORY_CARE_PROVIDER_SITE_OTHER): Payer: Medicare Other | Admitting: Family Medicine

## 2019-07-11 ENCOUNTER — Other Ambulatory Visit: Payer: Self-pay

## 2019-07-11 ENCOUNTER — Encounter: Payer: Self-pay | Admitting: Family Medicine

## 2019-07-11 VITALS — BP 128/60 | HR 80 | Ht 69.0 in | Wt 139.0 lb

## 2019-07-11 DIAGNOSIS — L570 Actinic keratosis: Secondary | ICD-10-CM | POA: Diagnosis not present

## 2019-07-11 DIAGNOSIS — I10 Essential (primary) hypertension: Secondary | ICD-10-CM | POA: Diagnosis not present

## 2019-07-11 DIAGNOSIS — K219 Gastro-esophageal reflux disease without esophagitis: Secondary | ICD-10-CM | POA: Diagnosis not present

## 2019-07-11 DIAGNOSIS — E032 Hypothyroidism due to medicaments and other exogenous substances: Secondary | ICD-10-CM | POA: Diagnosis not present

## 2019-07-11 DIAGNOSIS — Z85828 Personal history of other malignant neoplasm of skin: Secondary | ICD-10-CM | POA: Diagnosis not present

## 2019-07-11 MED ORDER — AMLODIPINE BESYLATE 5 MG PO TABS: 5.0000 mg | ORAL_TABLET | Freq: Every day | ORAL | 1 refills | Status: DC

## 2019-07-11 MED ORDER — LOSARTAN POTASSIUM 50 MG PO TABS: 50.0000 mg | ORAL_TABLET | Freq: Every day | ORAL | 1 refills | Status: DC

## 2019-07-11 MED ORDER — LEVOTHYROXINE SODIUM 25 MCG PO TABS: 25.0000 ug | ORAL_TABLET | Freq: Every day | ORAL | 1 refills | Status: DC

## 2019-07-11 MED ORDER — PANTOPRAZOLE SODIUM 40 MG PO TBEC: 40.0000 mg | DELAYED_RELEASE_TABLET | Freq: Every day | ORAL | 1 refills | Status: DC

## 2019-07-11 NOTE — Progress Notes (Signed)
Date:  07/11/2019   Name:  Victoria Rush   DOB:  [birth year]   MRN:  [id number]   Chief Complaint: Hypertension, Hypothyroidism, and Gastroesophageal Reflux  Hypertension This is a chronic problem. The current episode started more than 1 year ago. The problem has been gradually improving since onset. The problem is controlled. Pertinent negatives include no anxiety, blurred vision, chest pain, headaches, malaise/fatigue, neck pain, orthopnea, palpitations, peripheral edema, PND, shortness of breath or sweats. There are no associated agents to hypertension. Risk factors for coronary artery disease include dyslipidemia. Past treatments include angiotensin blockers and calcium channel blockers. The current treatment provides moderate improvement. There are no compliance problems.  There is no history of angina, kidney disease, CAD/MI, CVA, heart failure, left ventricular hypertrophy, PVD or retinopathy. Identifiable causes of hypertension include a thyroid problem. There is no history of chronic renal disease, a hypertension causing med or renovascular disease.  Gastroesophageal Reflux She reports no abdominal pain, no belching, no chest pain, no choking, no coughing, no dysphagia, no early satiety, no heartburn, no hoarse voice, no nausea, no sore throat, no stridor, no tooth decay, no water brash or no wheezing. This is a chronic problem. The current episode started in the past 7 days. The problem occurs occasionally. The problem has been gradually improving. The symptoms are aggravated by certain foods. Pertinent negatives include no anemia, fatigue, melena, muscle weakness, orthopnea or weight loss.  Thyroid Problem Presents for follow-up visit. Patient reports no anxiety, cold intolerance, constipation, depressed mood, diaphoresis, diarrhea, dry skin, fatigue, hair loss, heat intolerance, hoarse voice, leg swelling, nail problem, palpitations, visual change or weight loss. The symptoms have been stable.  Her past medical history is significant for hyperlipidemia. There is no history of heart failure.  Hyperlipidemia This is a chronic problem. The current episode started more than 1 year ago. The problem is controlled. Recent lipid tests were reviewed and are normal. She has no history of chronic renal disease. Pertinent negatives include no chest pain, myalgias or shortness of breath. Current antihyperlipidemic treatment includes diet change. The current treatment provides moderate improvement of lipids. There are no compliance problems.     Lab Results  Component Value Date   CREATININE 0.81 08/28/2018   BUN 9 08/28/2018   NA 140 08/28/2018   K 4.3 08/28/2018   CL 101 08/28/2018   CO2 26 08/28/2018   Lab Results  Component Value Date   CHOL 152 08/28/2018   HDL 47 08/28/2018   LDLCALC 90 08/28/2018   TRIG 75 08/28/2018   CHOLHDL 3.2 08/28/2018   Lab Results  Component Value Date   TSH 2.350 08/28/2018   No results found for: HGBA1C   Review of Systems  Constitutional: Negative for chills, diaphoresis, fatigue, fever, malaise/fatigue and weight loss.  HENT: Negative for drooling, ear discharge, ear pain, hoarse voice and sore throat.   Eyes: Negative for blurred vision.  Respiratory: Negative for cough, choking, shortness of breath and wheezing.   Cardiovascular: Negative for chest pain, palpitations, orthopnea, leg swelling and PND.  Gastrointestinal: Negative for abdominal pain, blood in stool, constipation, diarrhea, dysphagia, heartburn, melena and nausea.  Endocrine: Negative for cold intolerance, heat intolerance and polydipsia.  Genitourinary: Negative for dysuria, frequency, hematuria and urgency.  Musculoskeletal: Negative for back pain, myalgias, muscle weakness and neck pain.  Skin: Negative for rash.  Allergic/Immunologic: Negative for environmental allergies.  Neurological: Negative for dizziness and headaches.  Hematological: Does not bruise/bleed easily.    Psychiatric/Behavioral:  Negative for suicidal ideas. The patient is not nervous/anxious.     Patient Active Problem List   Diagnosis Date Noted  . Quadriceps strain, right, initial encounter 08/23/2017  . Contact dermatitis 02/12/2016  . Esophageal reflux 02/12/2016  . Essential hypertension 02/12/2016  . Chronic seasonal allergic rhinitis due to pollen 12/04/2015  . 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 Hypercholesterolemia 01/30/2015    Allergies  Allergen Reactions  . Sulfa Antibiotics     Past Surgical History:  Procedure Laterality Date  . APPENDECTOMY    . COLONOSCOPY  2008   Dr Sonny Masters- repeat in 10 years  . SKIN CANCER EXCISION     foot    Social History   Tobacco Use  . Smoking status: Never Smoker  . Smokeless tobacco: Never Used  Substance Use Topics  . Alcohol use: No    Alcohol/week: 0.0 standard drinks  . Drug use: No     Medication list has been reviewed and updated.  Current Meds  Medication Sig  . amLODipine (NORVASC) 5 MG tablet Take 1 tablet (5 mg total) by mouth daily.  Marland Kitchen aspirin 81 MG tablet Take 81 mg by mouth daily.  . cetirizine (ZYRTEC) 10 MG tablet Take 1 tablet (10 mg total) by mouth daily.  Marland Kitchen levothyroxine (SYNTHROID) 25 MCG tablet Take 1 tablet by mouth daily.  Marland Kitchen losartan (COZAAR) 50 MG tablet Take 1 tablet by mouth once daily  . pantoprazole (PROTONIX) 40 MG tablet Take 1 tablet (40 mg total) by mouth daily.    PHQ 2/9 Scores 07/11/2019 01/16/2019 12/13/2018 08/23/2017  PHQ - 2 Score 0 0 0 0  PHQ- 9 Score 0 0 0 0    BP Readings from Last 3 Encounters:  07/11/19 128/60  01/16/19 138/70  12/13/18 132/78    Physical Exam Vitals and nursing note reviewed.  Constitutional:      Appearance: She is well-developed.  HENT:     Head: Normocephalic.     Right Ear: Tympanic membrane, ear canal and external ear normal.     Left Ear: Tympanic membrane, ear canal and external ear normal.     Nose: Nose normal.     Mouth/Throat:     Mouth:  Mucous membranes are moist.     Pharynx: No oropharyngeal exudate or posterior oropharyngeal erythema.  Eyes:     General: Lids are everted, no foreign bodies appreciated. No scleral icterus.       Left eye: No foreign body or hordeolum.     Conjunctiva/sclera: Conjunctivae normal.     Right eye: Right conjunctiva is not injected.     Left eye: Left conjunctiva is not injected.     Pupils: Pupils are equal, round, and reactive to light.  Neck:     Thyroid: No thyromegaly.     Vascular: No JVD.     Trachea: No tracheal deviation.  Cardiovascular:     Rate and Rhythm: Normal rate and regular rhythm.     Heart sounds: Normal heart sounds. No murmur. No friction rub. No gallop.   Pulmonary:     Effort: Pulmonary effort is normal. No respiratory distress.     Breath sounds: Normal breath sounds. No wheezing, rhonchi or rales.  Chest:     Chest wall: No tenderness.  Abdominal:     General: Bowel sounds are normal. There is no distension.     Palpations: Abdomen is soft. There is no mass.     Tenderness: There is no abdominal tenderness. There is no  guarding or rebound.  Musculoskeletal:        General: No tenderness. Normal range of motion.     Cervical back: Normal range of motion and neck supple.  Lymphadenopathy:     Cervical: No cervical adenopathy.  Skin:    General: Skin is warm.     Findings: No rash.  Neurological:     Mental Status: She is alert and oriented to person, place, and time.     Cranial Nerves: No cranial nerve deficit.     Deep Tendon Reflexes: Reflexes normal.  Psychiatric:        Mood and Affect: Mood is not anxious or depressed.     Wt Readings from Last 3 Encounters:  07/11/19 139 lb (63 kg)  01/16/19 136 lb (61.7 kg)  12/13/18 135 lb (61.2 kg)    BP 128/60   Pulse 80   Ht 5\' 9"  (1.753 m)   Wt 139 lb (63 kg)   BMI 20.53 kg/m   Assessment and Plan:  1. Gastroesophageal reflux disease Chronic.  Controlled.  Stable.  Will continue pantoprazole  40 mg once a day. - pantoprazole (PROTONIX) 40 MG tablet; Take 1 tablet (40 mg total) by mouth daily.  Dispense: 90 tablet; Refill: 1  2. Essential hypertension Chronic.  Controlled.  Stable.  Continue losartan 50 mg once a day amlodipine 5 mg once a day.  Reviewed previous renal function panel which is acceptable.  We will recheck patient in 6 months. - losartan (COZAAR) 50 MG tablet; Take 1 tablet (50 mg total) by mouth daily.  Dispense: 90 tablet; Refill: 1 - amLODipine (NORVASC) 5 MG tablet; Take 1 tablet (5 mg total) by mouth daily.  Dispense: 90 tablet; Refill: 1  3. 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  Controlled.  Stable.  Reviewed previous TSH which has stabilized in the 2.3-2.6 range.  We will continue with levothyroxine 25 mcg daily. - levothyroxine (SYNTHROID) 25 MCG tablet; Take 1 tablet (25 mcg total) by mouth daily.  Dispense: 90 tablet; Refill: 1

## 2019-08-30 ENCOUNTER — Ambulatory Visit (INDEPENDENT_AMBULATORY_CARE_PROVIDER_SITE_OTHER): Payer: Medicare Other | Admitting: Family Medicine

## 2019-08-30 ENCOUNTER — Encounter: Payer: Self-pay | Admitting: Family Medicine

## 2019-08-30 ENCOUNTER — Other Ambulatory Visit: Payer: Self-pay

## 2019-08-30 VITALS — BP 130/70 | HR 72 | Ht 69.0 in | Wt 135.0 lb

## 2019-08-30 DIAGNOSIS — Z1231 Encounter for screening mammogram for malignant neoplasm of breast: Secondary | ICD-10-CM

## 2019-08-30 DIAGNOSIS — I10 Essential (primary) hypertension: Secondary | ICD-10-CM

## 2019-08-30 DIAGNOSIS — E032 Hypothyroidism due to medicaments and other exogenous substances: Secondary | ICD-10-CM | POA: Diagnosis not present

## 2019-08-30 DIAGNOSIS — J301 Allergic rhinitis due to pollen: Secondary | ICD-10-CM

## 2019-08-30 DIAGNOSIS — Z0000 Encounter for general adult medical examination without abnormal findings: Secondary | ICD-10-CM | POA: Diagnosis not present

## 2019-08-30 DIAGNOSIS — E7800 Pure hypercholesterolemia, unspecified: Secondary | ICD-10-CM

## 2019-08-30 MED ORDER — MONTELUKAST SODIUM 10 MG PO TABS: 10.0000 mg | ORAL_TABLET | Freq: Every day | ORAL | 3 refills | Status: DC

## 2019-08-30 MED ORDER — AZELASTINE HCL 0.1 % NA SOLN: 2.0000 | Freq: Two times a day (BID) | NASAL | 12 refills | Status: DC

## 2019-08-30 NOTE — Progress Notes (Signed)
Date:  08/30/2019   Name:  Victoria Rush   DOB:  Aug 22, 1945   MRN:  [id number]   Chief Complaint: Annual Exam (no hysterectomy/ no sexual activity/ no pain)  Patient is a 74 year old female who presents for a comprehensive physical exam. The patient reports the following problems: rhinorrhea. Health maintenance has been reviewed mammogram to be scheduled.   Lab Results  Component Value Date   CREATININE 0.81 08/28/2018   BUN 9 08/28/2018   NA 140 08/28/2018   K 4.3 08/28/2018   CL 101 08/28/2018   CO2 26 08/28/2018   Lab Results  Component Value Date   CHOL 152 08/28/2018   HDL 47 08/28/2018   LDLCALC 90 08/28/2018   TRIG 75 08/28/2018   CHOLHDL 3.2 08/28/2018   Lab Results  Component Value Date   TSH 2.350 08/28/2018   No results found for: HGBA1C   Review of Systems  Constitutional: Negative.  Negative for chills, fatigue, fever and unexpected weight change.  HENT: Negative for congestion, ear discharge, ear pain, rhinorrhea, sinus pressure, sneezing and sore throat.   Eyes: Negative for photophobia, pain, discharge, redness and itching.  Respiratory: Negative for cough, shortness of breath, wheezing and stridor.   Gastrointestinal: Negative for abdominal pain, blood in stool, constipation, diarrhea, nausea and vomiting.  Endocrine: Negative for cold intolerance, heat intolerance, polydipsia, polyphagia and polyuria.  Genitourinary: Negative for dysuria, flank pain, frequency, hematuria, menstrual problem, pelvic pain, urgency, vaginal bleeding and vaginal discharge.  Musculoskeletal: Negative for arthralgias, back pain and myalgias.  Skin: Negative for rash.  Allergic/Immunologic: Negative for environmental allergies and food allergies.  Neurological: Negative for dizziness, weakness, light-headedness, numbness and headaches.  Hematological: Negative for adenopathy. Does not bruise/bleed easily.  Psychiatric/Behavioral: Negative for dysphoric mood. The patient is  not nervous/anxious.     Patient Active Problem List   Diagnosis Date Noted  . Quadriceps strain, right, initial encounter 08/23/2017  . Contact dermatitis 02/12/2016  . Esophageal reflux 02/12/2016  . Essential hypertension 02/12/2016  . Chronic seasonal allergic rhinitis due to pollen 12/04/2015  . 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 Hypercholesterolemia 01/30/2015    Allergies  Allergen Reactions  . Sulfa Antibiotics     Past Surgical History:  Procedure Laterality Date  . APPENDECTOMY    . COLONOSCOPY  2008   Dr Sonny Masters- repeat in 10 years  . SKIN CANCER EXCISION     foot    Social History   Tobacco Use  . Smoking status: Never Smoker  . Smokeless tobacco: Never Used  Substance Use Topics  . Alcohol use: No    Alcohol/week: 0.0 standard drinks  . Drug use: No     Medication list has been reviewed and updated.  Current Meds  Medication Sig  . amLODipine (NORVASC) 5 MG tablet Take 1 tablet (5 mg total) by mouth daily.  Marland Kitchen aspirin 81 MG tablet Take 81 mg by mouth daily.  . cetirizine (ZYRTEC) 10 MG tablet Take 1 tablet (10 mg total) by mouth daily.  Marland Kitchen levothyroxine (SYNTHROID) 25 MCG tablet Take 1 tablet (25 mcg total) by mouth daily.  Marland Kitchen losartan (COZAAR) 50 MG tablet Take 1 tablet (50 mg total) by mouth daily.  . montelukast (SINGULAIR) 10 MG tablet Take 1 tablet (10 mg total) by mouth at bedtime.  . pantoprazole (PROTONIX) 40 MG tablet Take 1 tablet (40 mg total) by mouth daily.    PHQ 2/9 Scores 08/30/2019 07/11/2019 01/16/2019 12/13/2018  PHQ - 2 Score  0 0 0 0  PHQ- 9 Score 0 0 0 0    BP Readings from Last 3 Encounters:  08/30/19 130/70  07/11/19 128/60  01/16/19 138/70    Physical Exam Vitals and nursing note reviewed. Exam conducted with a chaperone present.  Constitutional:      General: She is not in acute distress.    Appearance: Normal appearance. She is well-developed and well-groomed. She is obese. She is not ill-appearing, toxic-appearing or  diaphoretic.  HENT:     Head: Normocephalic and atraumatic.     Jaw: There is normal jaw occlusion.     Right Ear: Hearing, tympanic membrane, ear canal and external ear normal. There is no impacted cerumen.     Left Ear: Hearing, tympanic membrane, ear canal and external ear normal. There is no impacted cerumen.     Nose: Nose normal. No congestion.     Mouth/Throat:     Lips: Pink.     Mouth: Mucous membranes are moist.     Dentition: Normal dentition.     Tongue: No lesions.     Palate: No mass and lesions.     Pharynx: Oropharynx is clear. No oropharyngeal exudate or posterior oropharyngeal erythema.  Eyes:     General: Lids are normal. Vision grossly intact. Gaze aligned appropriately.        Right eye: No discharge.        Left eye: No discharge.     Extraocular Movements: Extraocular movements intact.     Conjunctiva/sclera: Conjunctivae normal.     Pupils: Pupils are equal, round, and reactive to light.     Funduscopic exam:       Left eye: Red reflex present. Neck:     Thyroid: No thyroid mass, thyromegaly or thyroid tenderness.     Vascular: Normal carotid pulses. No carotid bruit, hepatojugular reflux or JVD.     Trachea: Trachea and phonation normal.  Cardiovascular:     Rate and Rhythm: Normal rate and regular rhythm.     Chest Wall: PMI is not displaced. No thrill.     Pulses: Normal pulses.          Carotid pulses are 2+ on the right side and 2+ on the left side.      Radial pulses are 2+ on the right side and 2+ on the left side.       Femoral pulses are 2+ on the right side and 2+ on the left side.      Popliteal pulses are 2+ on the right side and 2+ on the left side.       Dorsalis pedis pulses are 2+ on the right side and 2+ on the left side.       Posterior tibial pulses are 2+ on the right side and 2+ on the left side.     Heart sounds: Normal heart sounds, S1 normal and S2 normal. No murmur. No systolic murmur. No diastolic murmur. No friction rub. No  gallop. No S3 or S4 sounds.   Pulmonary:     Effort: Pulmonary effort is normal.     Breath sounds: Normal breath sounds. No decreased air movement. No decreased breath sounds, wheezing, rhonchi or rales.    Chest:     Chest wall: No mass.     Breasts:        Right: Normal. No swelling, bleeding, inverted nipple, mass, nipple discharge, skin change or tenderness.        Left: Normal. No  swelling, bleeding, inverted nipple, mass, nipple discharge, skin change or tenderness.  Abdominal:     General: Bowel sounds are normal.     Palpations: Abdomen is soft. There is no hepatomegaly, splenomegaly or mass.     Tenderness: There is no abdominal tenderness. There is no right CVA tenderness, left CVA tenderness or guarding.     Hernia: There is no hernia in the left inguinal area or right inguinal area.  Genitourinary:    Labia:        Right: No rash.        Left: No rash.      Rectum: Normal. Guaiac result negative. No mass.  Musculoskeletal:        General: Normal range of motion.     Cervical back: Normal range of motion and neck supple.     Right lower leg: No edema.     Left lower leg: No edema.  Lymphadenopathy:     Head:     Right side of head: No submental or submandibular adenopathy.     Left side of head: No submental or submandibular adenopathy.     Cervical: No cervical adenopathy.     Right cervical: No superficial, deep or posterior cervical adenopathy.    Left cervical: No superficial or deep cervical adenopathy.     Upper Body:     Right upper body: No supraclavicular, axillary or pectoral adenopathy.     Left upper body: No supraclavicular, axillary or pectoral adenopathy.     Lower Body: No right inguinal adenopathy.  Skin:    General: Skin is warm and dry.     Capillary Refill: Capillary refill takes less than 2 seconds.  Neurological:     Mental Status: She is alert.     Cranial Nerves: Cranial nerves are intact.     Sensory: Sensation is intact.     Motor:  Motor function is intact.     Deep Tendon Reflexes: Reflexes are normal and symmetric.     Reflex Scores:      Tricep reflexes are 2+ on the right side and 2+ on the left side.      Bicep reflexes are 2+ on the right side and 2+ on the left side.      Brachioradialis reflexes are 2+ on the right side and 2+ on the left side.      Patellar reflexes are 2+ on the right side and 2+ on the left side.      Achilles reflexes are 2+ on the right side and 2+ on the left side. Psychiatric:        Behavior: Behavior is cooperative.     Wt Readings from Last 3 Encounters:  08/30/19 135 lb (61.2 kg)  07/11/19 139 lb (63 kg)  01/16/19 136 lb (61.7 kg)    BP 130/70   Pulse 72   Ht 5\' 9"  (1.753 m)   Wt 135 lb (61.2 kg)   BMI 19.94 kg/m   Assessment and Plan:   1. Annual physical exam No subjective objective concerns noted during the history and physical exam.  Patient's previous encounters were reviewed as were her 2020 labs and any care elsewhere circumstances.Victoria Rush is a 74 y.o. female who presents today for her Complete Annual Exam. She feels well. She reports exercising . She reports she is sleeping well. Immunizations are reviewed and recommendations provided.   Age appropriate screening tests are discussed. Counseling given for risk factor reduction interventions.  Patient is  to obtain lab work today as noted below.  Patient will obtain a renal panel, TSH, and lipid panel.  2. Breast cancer screening by mammogram The exam was normal with no palpable masses or adenopathy.  Patient was scheduled for screening bilateral mammogram. - MM 3D SCREEN BREAST BILATERAL; Future  3. Chronic seasonal allergic rhinitis due to pollen Patient with history of chronic seasonal allergic rhinitis which is currently controlled under animal control with Zyrtec, and Singulair 10 mg once a day.  Singulair was refilled and a trial of Astelin nasal spray was initiated.

## 2019-08-31 LAB — RENAL FUNCTION PANEL
Albumin: 4.5 g/dL (ref 3.7–4.7)
BUN/Creatinine Ratio: 14 (ref 12–28)
BUN: 10 mg/dL (ref 8–27)
CO2: 23 mmol/L (ref 20–29)
Calcium: 9.4 mg/dL (ref 8.7–10.3)
Chloride: 97 mmol/L (ref 96–106)
Creatinine, Ser: 0.73 mg/dL (ref 0.57–1.00)
GFR calc Af Amer: 94 mL/min/{1.73_m2} (ref >59)
GFR calc non Af Amer: 81 mL/min/{1.73_m2} (ref >59)
Glucose: 94 mg/dL (ref 65–99)
Phosphorus: 4.1 mg/dL (ref 3.0–4.3)
Potassium: 4.8 mmol/L (ref 3.5–5.2)
Sodium: 139 mmol/L (ref 134–144)

## 2019-08-31 LAB — LIPID PANEL WITH LDL/HDL RATIO
Cholesterol, Total: 195 mg/dL (ref 100–199)
HDL: 61 mg/dL (ref >39)
LDL Chol Calc (NIH): 118 mg/dL — ABNORMAL HIGH (ref 0–99)
LDL/HDL Ratio: 1.9 ratio (ref 0.0–3.2)
Triglycerides: 86 mg/dL (ref 0–149)
VLDL Cholesterol Cal: 16 mg/dL (ref 5–40)

## 2019-08-31 LAB — TSH: TSH: 2.92 u[IU]/mL (ref 0.450–4.500)

## 2019-10-08 ENCOUNTER — Ambulatory Visit
Admission: RE | Admit: 2019-10-08 | Discharge: 2019-10-08 | Disposition: A | Discharge status: Home / Self Care | Payer: Medicare Other | Source: Ambulatory Visit | Attending: Family Medicine | Admitting: Family Medicine

## 2019-10-08 ENCOUNTER — Other Ambulatory Visit: Payer: Self-pay

## 2019-10-08 DIAGNOSIS — Z1231 Encounter for screening mammogram for malignant neoplasm of breast: Secondary | ICD-10-CM

## 2019-10-17 ENCOUNTER — Ambulatory Visit: Payer: Medicare Other

## 2019-11-11 ENCOUNTER — Encounter: Payer: Self-pay | Admitting: Family Medicine

## 2019-11-11 ENCOUNTER — Ambulatory Visit
Admission: RE | Admit: 2019-11-11 | Discharge: 2019-11-11 | Disposition: A | Discharge status: Home / Self Care | Payer: Medicare Other | Source: Ambulatory Visit | Attending: Family Medicine | Admitting: Family Medicine

## 2019-11-11 ENCOUNTER — Other Ambulatory Visit: Payer: Self-pay

## 2019-11-11 ENCOUNTER — Ambulatory Visit (INDEPENDENT_AMBULATORY_CARE_PROVIDER_SITE_OTHER): Payer: Medicare Other | Admitting: Family Medicine

## 2019-11-11 ENCOUNTER — Ambulatory Visit
Admission: RE | Admit: 2019-11-11 | Discharge: 2019-11-11 | Disposition: A | Discharge status: Home / Self Care | Payer: Medicare Other | Attending: Family Medicine | Admitting: Family Medicine

## 2019-11-11 VITALS — BP 130/80 | HR 80 | Temp 99.0°F | Ht 69.0 in | Wt 133.0 lb

## 2019-11-11 DIAGNOSIS — R059 Cough, unspecified: Secondary | ICD-10-CM

## 2019-11-11 DIAGNOSIS — Z79899 Other long term (current) drug therapy: Secondary | ICD-10-CM | POA: Diagnosis not present

## 2019-11-11 DIAGNOSIS — E785 Hyperlipidemia, unspecified: Secondary | ICD-10-CM | POA: Diagnosis not present

## 2019-11-11 DIAGNOSIS — R509 Fever, unspecified: Secondary | ICD-10-CM | POA: Diagnosis not present

## 2019-11-11 DIAGNOSIS — J0100 Acute maxillary sinusitis, unspecified: Secondary | ICD-10-CM

## 2019-11-11 DIAGNOSIS — R05 Cough: Secondary | ICD-10-CM | POA: Insufficient documentation

## 2019-11-11 MED ORDER — AZITHROMYCIN 250 MG PO TABS: ORAL_TABLET | ORAL | 0 refills | Status: DC

## 2019-11-11 NOTE — Progress Notes (Signed)
Date:  11/11/2019   Name:  Victoria Rush   DOB:  [birth year]   MRN:  [id number]   Chief Complaint: Sinusitis (feels like there is phlegm in back of throat, head pressure- started friday)  Sinusitis This is a new problem. The current episode started in the past 7 days (Friday). The problem has been gradually worsening since onset. Maximum temperature: low grade/99. Her pain is at a severity of 3/10. The pain is mild. Associated symptoms include congestion, coughing, headaches and sinus pressure. Pertinent negatives include no chills, diaphoresis, ear pain, hoarse voice, neck pain, shortness of breath, sneezing, sore throat or swollen glands. [facility]) Treatments tried: zyrtec. The treatment provided moderate relief.    Lab Results  Component Value Date   CREATININE 0.73 08/30/2019   BUN 10 08/30/2019   NA 139 08/30/2019   K 4.8 08/30/2019   CL 97 08/30/2019   CO2 23 08/30/2019   Lab Results  Component Value Date   CHOL 195 08/30/2019   HDL 61 08/30/2019   LDLCALC 118 (H) 08/30/2019   TRIG 86 08/30/2019   CHOLHDL 3.2 08/28/2018   Lab Results  Component Value Date   TSH 2.920 08/30/2019   No results found for: HGBA1C No results found for: WBC, HGB, HCT, MCV, PLT No results found for: ALT, AST, GGT, ALKPHOS, BILITOT   Review of Systems  Constitutional: Negative.  Negative for chills, diaphoresis, fatigue, fever and unexpected weight change.  HENT: Positive for congestion and sinus pressure. Negative for ear discharge, ear pain, hoarse voice, rhinorrhea, sneezing and sore throat.   Eyes: Negative for photophobia, pain, discharge, redness and itching.  Respiratory: Positive for cough. Negative for shortness of breath, wheezing and stridor.   Gastrointestinal: Negative for abdominal pain, blood in stool, constipation, diarrhea, nausea and vomiting.  Endocrine: Negative for cold intolerance, heat intolerance, polydipsia, polyphagia and polyuria.  Genitourinary: Negative for  dysuria, flank pain, frequency, hematuria, menstrual problem, pelvic pain, urgency, vaginal bleeding and vaginal discharge.  Musculoskeletal: Negative for arthralgias, back pain, myalgias and neck pain.  Skin: Negative for rash.  Allergic/Immunologic: Negative for environmental allergies and food allergies.  Neurological: Positive for headaches. Negative for dizziness, weakness, light-headedness and numbness.  Hematological: Negative for adenopathy. Does not bruise/bleed easily.  Psychiatric/Behavioral: Negative for dysphoric mood. The patient is not nervous/anxious.     Patient Active Problem List   Diagnosis Date Noted  . Quadriceps strain, right, initial encounter 08/23/2017  . Contact dermatitis 02/12/2016  . Esophageal reflux 02/12/2016  . Essential hypertension 02/12/2016  . Chronic seasonal allergic rhinitis due to pollen 12/04/2015  . 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 Hypercholesterolemia 01/30/2015    Allergies  Allergen Reactions  . Sulfa Antibiotics     Past Surgical History:  Procedure Laterality Date  . APPENDECTOMY    . COLONOSCOPY  2008   Dr Sonny Masters- repeat in 10 years  . SKIN CANCER EXCISION     foot    Social History   Tobacco Use  . Smoking status: Never Smoker  . Smokeless tobacco: Never Used  Substance Use Topics  . Alcohol use: No    Alcohol/week: 0.0 standard drinks  . Drug use: No     Medication list has been reviewed and updated.  Current Meds  Medication Sig  . amLODipine (NORVASC) 5 MG tablet Take 1 tablet (5 mg total) by mouth daily.  Marland Kitchen aspirin 81 MG tablet Take 81 mg by mouth daily.  Marland Kitchen azelastine (ASTELIN) 0.1 % nasal spray  Place 2 sprays into both nostrils 2 (two) times daily. Use in each nostril as directed  . cetirizine (ZYRTEC) 10 MG tablet Take 1 tablet (10 mg total) by mouth daily.  Marland Kitchen levothyroxine (SYNTHROID) 25 MCG tablet Take 1 tablet (25 mcg total) by mouth daily.  Marland Kitchen losartan (COZAAR) 50 MG tablet Take 1 tablet (50 mg total) by  mouth daily.  . montelukast (SINGULAIR) 10 MG tablet Take 1 tablet (10 mg total) by mouth at bedtime.  . pantoprazole (PROTONIX) 40 MG tablet Take 1 tablet (40 mg total) by mouth daily.    PHQ 2/9 Scores 08/30/2019 07/11/2019 01/16/2019 12/13/2018  PHQ - 2 Score 0 0 0 0  PHQ- 9 Score 0 0 0 0    BP Readings from Last 3 Encounters:  11/11/19 130/80  08/30/19 130/70  07/11/19 128/60    Physical Exam Vitals and nursing note reviewed.  Constitutional:      General: She is not in acute distress.    Appearance: She is well-developed. She is not diaphoretic.  HENT:     Head: Normocephalic and atraumatic.     Right Ear: External ear normal.     Left Ear: External ear normal.     Nose: Nose normal.  Eyes:     General: Lids are everted, no foreign bodies appreciated. No scleral icterus.       Right eye: No discharge.        Left eye: No foreign body, discharge or hordeolum.     Conjunctiva/sclera: Conjunctivae normal.     Right eye: Right conjunctiva is not injected.     Left eye: Left conjunctiva is not injected.     Pupils: Pupils are equal, round, and reactive to light.  Neck:     Thyroid: No thyromegaly.     Vascular: No JVD.     Trachea: No tracheal deviation.  Cardiovascular:     Rate and Rhythm: Normal rate and regular rhythm.     Heart sounds: Normal heart sounds. No murmur. No friction rub. No gallop.   Pulmonary:     Effort: Pulmonary effort is normal. No respiratory distress.     Breath sounds: Examination of the right-middle field reveals decreased breath sounds. Decreased breath sounds present. No wheezing, rhonchi or rales.     Comments: decreased breath RML Abdominal:     General: Bowel sounds are normal.     Palpations: Abdomen is soft. There is no mass.     Tenderness: There is no abdominal tenderness. There is no guarding or rebound.  Musculoskeletal:        General: No tenderness. Normal range of motion.     Cervical back: Normal range of motion and neck supple.   Lymphadenopathy:     Cervical: No cervical adenopathy.  Skin:    General: Skin is warm and dry.     Findings: No rash.  Neurological:     Mental Status: She is alert and oriented to person, place, and time.     Cranial Nerves: No cranial nerve deficit.     Deep Tendon Reflexes: Reflexes are normal and symmetric. Reflexes normal.  Psychiatric:        Mood and Affect: Mood is not anxious or depressed.     Wt Readings from Last 3 Encounters:  11/11/19 133 lb (60.3 kg)  08/30/19 135 lb (61.2 kg)  07/11/19 139 lb (63 kg)    BP 130/80   Pulse 80   Temp 99 F (37.2 C) (Oral)  Ht 5\' 9"  (1.753 m)   Wt 133 lb (60.3 kg)   SpO2 97%   BMI 19.64 kg/m   Assessment and Plan:   1. Acute maxillary sinusitis, recurrence not specified Acute.  Persistent.  Relatively stable.  Physical exam notes there is tenderness over the frontal and maxillary sinuses maxillary more so.  We will initiate a azithromycin to 50 mg 2 today followed by 1 a day for 4 days. - azithromycin (ZITHROMAX) 250 MG tablet; 2 today then 1 a day for 4 days  Dispense: 6 tablet; Refill: 0  2. Cough Acute.  Persistent.  Mild.  Controlled.  Patient has a low-grade fever in the low 99 range with decreased breath sounds in the right middle lobe.  There is some concern of a atypical pneumonia and we will get a chest x-ray and start azithromycin as noted above. - DG Chest 2 View; Future

## 2019-12-26 DIAGNOSIS — H2513 Age-related nuclear cataract, bilateral: Secondary | ICD-10-CM | POA: Diagnosis not present

## 2020-01-02 ENCOUNTER — Other Ambulatory Visit: Payer: Self-pay

## 2020-01-02 ENCOUNTER — Ambulatory Visit (INDEPENDENT_AMBULATORY_CARE_PROVIDER_SITE_OTHER): Payer: Medicare Other | Admitting: Family Medicine

## 2020-01-02 ENCOUNTER — Encounter: Payer: Self-pay | Admitting: Family Medicine

## 2020-01-02 VITALS — BP 138/80 | HR 68 | Ht 69.0 in | Wt 131.0 lb

## 2020-01-02 DIAGNOSIS — I10 Essential (primary) hypertension: Secondary | ICD-10-CM

## 2020-01-02 DIAGNOSIS — E032 Hypothyroidism due to medicaments and other exogenous substances: Secondary | ICD-10-CM | POA: Diagnosis not present

## 2020-01-02 DIAGNOSIS — K219 Gastro-esophageal reflux disease without esophagitis: Secondary | ICD-10-CM | POA: Diagnosis not present

## 2020-01-02 MED ORDER — LOSARTAN POTASSIUM 50 MG PO TABS: 50.0000 mg | ORAL_TABLET | Freq: Every day | ORAL | 1 refills | Status: DC

## 2020-01-02 MED ORDER — AMLODIPINE BESYLATE 5 MG PO TABS: 5.0000 mg | ORAL_TABLET | Freq: Every day | ORAL | 1 refills | Status: DC

## 2020-01-02 MED ORDER — LEVOTHYROXINE SODIUM 25 MCG PO TABS: 25.0000 ug | ORAL_TABLET | Freq: Every day | ORAL | 1 refills | Status: DC

## 2020-01-02 MED ORDER — PANTOPRAZOLE SODIUM 40 MG PO TBEC: 40.0000 mg | DELAYED_RELEASE_TABLET | Freq: Every day | ORAL | 1 refills | Status: DC

## 2020-01-02 NOTE — Patient Instructions (Signed)

## 2020-01-02 NOTE — Progress Notes (Signed)
Date:  01/02/2020   Name:  Victoria Rush   DOB:  [birth year]   MRN:  [id number]   Chief Complaint: Hypertension, Gastroesophageal Reflux, and Hypothyroidism  Hypertension This is a chronic problem. The current episode started more than 1 year ago. The problem has been gradually worsening since onset. The problem is controlled. Pertinent negatives include no anxiety, blurred vision, chest pain, headaches, malaise/fatigue, neck pain, orthopnea, palpitations, peripheral edema, PND, shortness of breath or sweats. There are no associated agents to hypertension. There are no known risk factors for coronary artery disease. Past treatments include angiotensin blockers and calcium channel blockers. The current treatment provides moderate improvement. There are no compliance problems.  There is no history of angina, kidney disease, CAD/MI, CVA, heart failure, left ventricular hypertrophy, PVD or retinopathy. Identifiable causes of hypertension include a thyroid problem. There is no history of chronic renal disease, a hypertension causing med or renovascular disease.  Gastroesophageal Reflux She reports no abdominal pain, no belching, no chest pain, no choking, no coughing, no dysphagia, no early satiety, no heartburn, no hoarse voice, no nausea, no sore throat or no wheezing. This is a recurrent problem. The current episode started more than 1 year ago. The problem occurs rarely. The problem has been gradually improving. Pertinent negatives include no fatigue or weight loss.  Thyroid Problem Presents for follow-up visit. Patient reports no anxiety, cold intolerance, constipation, depressed mood, diaphoresis, diarrhea, dry skin, fatigue, hair loss, heat intolerance, hoarse voice, leg swelling, menstrual problem, nail problem, palpitations, tremors, visual change, weight gain or weight loss. The symptoms have been stable. There is no history of heart failure.    Lab Results  Component Value Date   CREATININE  0.73 08/30/2019   BUN 10 08/30/2019   NA 139 08/30/2019   K 4.8 08/30/2019   CL 97 08/30/2019   CO2 23 08/30/2019   Lab Results  Component Value Date   CHOL 195 08/30/2019   HDL 61 08/30/2019   LDLCALC 118 (H) 08/30/2019   TRIG 86 08/30/2019   CHOLHDL 3.2 08/28/2018   Lab Results  Component Value Date   TSH 2.920 08/30/2019   No results found for: HGBA1C No results found for: WBC, HGB, HCT, MCV, PLT No results found for: ALT, AST, GGT, ALKPHOS, BILITOT   Review of Systems  Constitutional: Negative.  Negative for chills, diaphoresis, fatigue, fever, malaise/fatigue, unexpected weight change, weight gain and weight loss.  HENT: Negative for congestion, ear discharge, ear pain, hoarse voice, rhinorrhea, sinus pressure, sneezing and sore throat.   Eyes: Negative for blurred vision, photophobia, pain, discharge, redness and itching.  Respiratory: Negative for cough, choking, shortness of breath, wheezing and stridor.   Cardiovascular: Negative for chest pain, palpitations, orthopnea and PND.  Gastrointestinal: Negative for abdominal pain, blood in stool, constipation, diarrhea, dysphagia, heartburn, nausea and vomiting.  Endocrine: Negative for cold intolerance, heat intolerance, polydipsia, polyphagia and polyuria.  Genitourinary: Negative for dysuria, flank pain, frequency, hematuria, menstrual problem, pelvic pain, urgency, vaginal bleeding and vaginal discharge.  Musculoskeletal: Negative for arthralgias, back pain, myalgias and neck pain.  Skin: Negative for rash.  Allergic/Immunologic: Negative for environmental allergies and food allergies.  Neurological: Negative for dizziness, tremors, weakness, light-headedness, numbness and headaches.  Hematological: Negative for adenopathy. Does not bruise/bleed easily.  Psychiatric/Behavioral: Negative for dysphoric mood. The patient is not nervous/anxious.     Patient Active Problem List   Diagnosis Date Noted  . Quadriceps strain,  right, initial encounter 08/23/2017  . Contact  dermatitis 02/12/2016  . Esophageal reflux 02/12/2016  . Essential hypertension 02/12/2016  . Chronic seasonal allergic rhinitis due to pollen 12/04/2015  . 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 Hypercholesterolemia 01/30/2015    Allergies  Allergen Reactions  . Sulfa Antibiotics     Past Surgical History:  Procedure Laterality Date  . APPENDECTOMY    . COLONOSCOPY  2008   Dr Sonny Masters- repeat in 10 years  . SKIN CANCER EXCISION     foot    Social History   Tobacco Use  . Smoking status: Never Smoker  . Smokeless tobacco: Never Used  Substance Use Topics  . Alcohol use: No    Alcohol/week: 0.0 standard drinks  . Drug use: No     Medication list has been reviewed and updated.  Current Meds  Medication Sig  . amLODipine (NORVASC) 5 MG tablet Take 1 tablet (5 mg total) by mouth daily.  Marland Kitchen aspirin 81 MG tablet Take 81 mg by mouth daily.  Marland Kitchen levothyroxine (SYNTHROID) 25 MCG tablet Take 1 tablet (25 mcg total) by mouth daily.  Marland Kitchen losartan (COZAAR) 50 MG tablet Take 1 tablet (50 mg total) by mouth daily.  . montelukast (SINGULAIR) 10 MG tablet Take 1 tablet (10 mg total) by mouth at bedtime.  . pantoprazole (PROTONIX) 40 MG tablet Take 1 tablet (40 mg total) by mouth daily.    PHQ 2/9 Scores 01/02/2020 08/30/2019 07/11/2019 01/16/2019  PHQ - 2 Score 0 0 0 0  PHQ- 9 Score 0 0 0 0    GAD 7 : Generalized Anxiety Score 01/02/2020 08/30/2019 07/11/2019  Nervous, Anxious, on Edge 0 0 0  Control/stop worrying 0 0 0  Worry too much - different things 0 0 0  Trouble relaxing 0 0 0  Restless 0 0 0  Easily annoyed or irritable 0 0 0  Afraid - awful might happen 0 0 0  Total GAD 7 Score 0 0 0    BP Readings from Last 3 Encounters:  01/02/20 138/80  11/11/19 130/80  08/30/19 130/70    Physical Exam  Wt Readings from Last 3 Encounters:  01/02/20 131 lb (59.4 kg)  11/11/19 133 lb (60.3 kg)  08/30/19 135 lb (61.2 kg)    BP 138/80   Pulse 68    Ht 5\' 9"  (1.753 m)   Wt 131 lb (59.4 kg)   BMI 19.35 kg/m   Assessment and Plan: 1. Essential hypertension Chronic.  Controlled.  Stable.  Current blood pressure is 138/80.  Continue amlodipine 5 mg and losartan 50 mg once a day. - amLODipine (NORVASC) 5 MG tablet; Take 1 tablet (5 mg total) by mouth daily.  Dispense: 90 tablet; Refill: 1 - losartan (COZAAR) 50 MG tablet; Take 1 tablet (50 mg total) by mouth daily.  Dispense: 90 tablet; Refill: 1  2. 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  Controlled.  Stable.  Continue levothyroxine 25 mcg daily. - levothyroxine (SYNTHROID) 25 MCG tablet; Take 1 tablet (25 mcg total) by mouth daily.  Dispense: 90 tablet; Refill: 1  3. Gastroesophageal reflux disease, unspecified whether esophagitis present Chronic.  Controlled.  Stable.  Continue pantoprazole 40 mg once a day. - pantoprazole (PROTONIX) 40 MG tablet; Take 1 tablet (40 mg total) by mouth daily.  Dispense: 90 tablet; Refill: 1  Patient brought up concerns on whether she needs to be on a statin we reviewed her lipid panel and it was noted that there was mild elevation of her LDL but her HDL LDL ratio  was in a good range that she is actually almost at half the risk for a female her age.  Given her diet sheet to continue and discussed the risk benefit of being on the medication versus a very limited benefit that may be obtained unless patient was to have a coronary cerebral event circumstance.

## 2020-01-06 DIAGNOSIS — L570 Actinic keratosis: Secondary | ICD-10-CM | POA: Diagnosis not present

## 2020-01-13 DIAGNOSIS — K219 Gastro-esophageal reflux disease without esophagitis: Secondary | ICD-10-CM | POA: Diagnosis not present

## 2020-01-13 DIAGNOSIS — H2511 Age-related nuclear cataract, right eye: Secondary | ICD-10-CM | POA: Diagnosis not present

## 2020-01-13 DIAGNOSIS — I10 Essential (primary) hypertension: Secondary | ICD-10-CM | POA: Diagnosis not present

## 2020-01-27 DIAGNOSIS — Z882 Allergy status to sulfonamides status: Secondary | ICD-10-CM | POA: Diagnosis not present

## 2020-01-27 DIAGNOSIS — Z9841 Cataract extraction status, right eye: Secondary | ICD-10-CM | POA: Diagnosis not present

## 2020-01-27 DIAGNOSIS — I10 Essential (primary) hypertension: Secondary | ICD-10-CM | POA: Diagnosis not present

## 2020-01-27 DIAGNOSIS — H2512 Age-related nuclear cataract, left eye: Secondary | ICD-10-CM | POA: Diagnosis not present

## 2020-02-11 ENCOUNTER — Ambulatory Visit: Payer: Medicare Other | Admitting: Family Medicine

## 2020-04-21 DIAGNOSIS — H1045 Other chronic allergic conjunctivitis: Secondary | ICD-10-CM | POA: Diagnosis not present

## 2020-06-11 ENCOUNTER — Ambulatory Visit: Payer: Medicare Other | Admitting: Family Medicine

## 2020-06-12 ENCOUNTER — Ambulatory Visit (INDEPENDENT_AMBULATORY_CARE_PROVIDER_SITE_OTHER): Payer: Medicare Other | Admitting: Family Medicine

## 2020-06-12 ENCOUNTER — Other Ambulatory Visit: Payer: Self-pay

## 2020-06-12 ENCOUNTER — Encounter: Payer: Self-pay | Admitting: Family Medicine

## 2020-06-12 VITALS — BP 120/82 | HR 72 | Ht 69.0 in | Wt 134.0 lb

## 2020-06-12 DIAGNOSIS — K130 Diseases of lips: Secondary | ICD-10-CM

## 2020-06-12 DIAGNOSIS — E032 Hypothyroidism due to medicaments and other exogenous substances: Secondary | ICD-10-CM | POA: Diagnosis not present

## 2020-06-12 DIAGNOSIS — K219 Gastro-esophageal reflux disease without esophagitis: Secondary | ICD-10-CM

## 2020-06-12 DIAGNOSIS — I10 Essential (primary) hypertension: Secondary | ICD-10-CM

## 2020-06-12 DIAGNOSIS — E7800 Pure hypercholesterolemia, unspecified: Secondary | ICD-10-CM

## 2020-06-12 MED ORDER — AMLODIPINE BESYLATE 5 MG PO TABS: 5.0000 mg | ORAL_TABLET | Freq: Every day | ORAL | 1 refills | Status: DC

## 2020-06-12 MED ORDER — LEVOTHYROXINE SODIUM 25 MCG PO TABS: 25.0000 ug | ORAL_TABLET | Freq: Every day | ORAL | 1 refills | Status: DC

## 2020-06-12 MED ORDER — LOSARTAN POTASSIUM 50 MG PO TABS: 50.0000 mg | ORAL_TABLET | Freq: Every day | ORAL | 1 refills | Status: DC

## 2020-06-12 MED ORDER — PANTOPRAZOLE SODIUM 40 MG PO TBEC: 40.0000 mg | DELAYED_RELEASE_TABLET | Freq: Every day | ORAL | 1 refills | Status: DC

## 2020-06-12 NOTE — Progress Notes (Signed)
Date:  06/12/2020   Name:  Victoria Rush   DOB:  10-Jul-1945   MRN:  [id number]   Chief Complaint: Hypertension, Hypothyroidism, and Gastroesophageal Reflux  Hypertension This is a chronic problem. The current episode started more than 1 year ago. The problem has been gradually improving since onset. The problem is controlled. Pertinent negatives include no anxiety, blurred vision, chest pain, headaches, malaise/fatigue, neck pain, orthopnea, palpitations, peripheral edema, PND, shortness of breath or sweats. There are no associated agents to hypertension. There are no known risk factors for coronary artery disease. Past treatments include angiotensin blockers and calcium channel blockers. The current treatment provides moderate improvement. There is no history of angina, kidney disease, CAD/MI, CVA, heart failure, left ventricular hypertrophy, PVD or retinopathy. Identifiable causes of hypertension include a thyroid problem. There is no history of chronic renal disease, a hypertension causing med or renovascular disease.  Gastroesophageal Reflux She reports no abdominal pain, no belching, no chest pain, no choking, no coughing, no dysphagia, no early satiety, no globus sensation, no heartburn, no hoarse voice, no nausea, no sore throat, no stridor, no tooth decay, no water brash or no wheezing. This is a chronic problem. The current episode started more than 1 year ago. The problem has been gradually improving. The symptoms are aggravated by certain foods. Pertinent negatives include no anemia, fatigue, melena, muscle weakness, orthopnea or weight loss. She has tried a PPI for the symptoms.  Thyroid Problem Presents for follow-up visit. Patient reports no anxiety, cold intolerance, constipation, depressed mood, diaphoresis, diarrhea, dry skin, fatigue, hair loss, heat intolerance, hoarse voice, leg swelling, menstrual problem, nail problem, palpitations, tremors, visual change, weight gain or weight  loss. The symptoms have been stable. There is no history of heart failure.    Lab Results  Component Value Date   CREATININE 0.73 08/30/2019   BUN 10 08/30/2019   NA 139 08/30/2019   K 4.8 08/30/2019   CL 97 08/30/2019   CO2 23 08/30/2019   Lab Results  Component Value Date   CHOL 195 08/30/2019   HDL 61 08/30/2019   LDLCALC 118 (H) 08/30/2019   TRIG 86 08/30/2019   CHOLHDL 3.2 08/28/2018   Lab Results  Component Value Date   TSH 2.920 08/30/2019   No results found for: HGBA1C No results found for: WBC, HGB, HCT, MCV, PLT No results found for: ALT, AST, GGT, ALKPHOS, BILITOT   Review of Systems  Constitutional: Negative.  Negative for chills, diaphoresis, fatigue, fever, malaise/fatigue, unexpected weight change, weight gain and weight loss.  HENT: Negative for congestion, ear discharge, ear pain, hoarse voice, rhinorrhea, sinus pressure, sneezing and sore throat.   Eyes: Negative for blurred vision, double vision, photophobia, pain, discharge, redness and itching.  Respiratory: Negative for cough, choking, shortness of breath, wheezing and stridor.   Cardiovascular: Negative for chest pain, palpitations, orthopnea and PND.  Gastrointestinal: Negative for abdominal pain, blood in stool, constipation, diarrhea, dysphagia, heartburn, melena, nausea and vomiting.  Endocrine: Negative for cold intolerance, heat intolerance, polydipsia, polyphagia and polyuria.  Genitourinary: Negative for dysuria, flank pain, frequency, hematuria, menstrual problem, pelvic pain, urgency, vaginal bleeding and vaginal discharge.  Musculoskeletal: Negative for arthralgias, back pain, myalgias, muscle weakness and neck pain.  Skin: Negative for rash.  Allergic/Immunologic: Negative for environmental allergies and food allergies.  Neurological: Negative for dizziness, tremors, weakness, light-headedness, numbness and headaches.  Hematological: Negative for adenopathy. Does not bruise/bleed easily.   Psychiatric/Behavioral: Negative for dysphoric mood. The patient is  not nervous/anxious.     Patient Active Problem List   Diagnosis Date Noted  . Quadriceps strain, right, initial encounter 08/23/2017  . Contact dermatitis 02/12/2016  . Esophageal reflux 02/12/2016  . Essential hypertension 02/12/2016  . Chronic seasonal allergic rhinitis due to pollen 12/04/2015  . 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 Hypercholesterolemia 01/30/2015    Allergies  Allergen Reactions  . Sulfa Antibiotics     Past Surgical History:  Procedure Laterality Date  . APPENDECTOMY    . COLONOSCOPY  2008   Dr Sonny Masters- repeat in 10 years  . SKIN CANCER EXCISION     foot    Social History   Tobacco Use  . Smoking status: Never Smoker  . Smokeless tobacco: Never Used  Substance Use Topics  . Alcohol use: No    Alcohol/week: 0.0 standard drinks  . Drug use: No     Medication list has been reviewed and updated.  Current Meds  Medication Sig  . amLODipine (NORVASC) 5 MG tablet Take 1 tablet (5 mg total) by mouth daily.  Marland Kitchen aspirin 81 MG tablet Take 81 mg by mouth daily.  . cetirizine (ZYRTEC) 10 MG tablet Take 1 tablet (10 mg total) by mouth daily.  Marland Kitchen etodolac (LODINE) 500 MG tablet Take 1 tablet (500 mg total) by mouth 2 (two) times daily.  Marland Kitchen levothyroxine (SYNTHROID) 25 MCG tablet Take 1 tablet (25 mcg total) by mouth daily.  Marland Kitchen losartan (COZAAR) 50 MG tablet Take 1 tablet (50 mg total) by mouth daily.  . montelukast (SINGULAIR) 10 MG tablet Take 1 tablet (10 mg total) by mouth at bedtime.  . pantoprazole (PROTONIX) 40 MG tablet Take 1 tablet (40 mg total) by mouth daily.    PHQ 2/9 Scores 06/12/2020 01/02/2020 08/30/2019 07/11/2019  PHQ - 2 Score 0 0 0 0  PHQ- 9 Score 0 0 0 0    GAD 7 : Generalized Anxiety Score 06/12/2020 01/02/2020 08/30/2019 07/11/2019  Nervous, Anxious, on Edge 0 0 0 0  Control/stop worrying 0 0 0 0  Worry too much - different things 0 0 0 0  Trouble relaxing 0 0 0 0  Restless 0  0 0 0  Easily annoyed or irritable 0 0 0 0  Afraid - awful might happen 0 0 0 0  Total GAD 7 Score 0 0 0 0    BP Readings from Last 3 Encounters:  06/12/20 120/82  01/02/20 138/80  11/11/19 130/80    Physical Exam Vitals and nursing note reviewed.  Constitutional:      General: She is not in acute distress.    Appearance: She is not diaphoretic.  HENT:     Head: Normocephalic and atraumatic.     Right Ear: Tympanic membrane and external ear normal.     Left Ear: Tympanic membrane and external ear normal.     Nose: Nose normal. No congestion or rhinorrhea.     Mouth/Throat:     Lips: Lesions present.     Mouth: Oropharynx is clear and moist.  Eyes:     General:        Right eye: No discharge.        Left eye: No discharge.     Extraocular Movements: EOM normal.     Conjunctiva/sclera: Conjunctivae normal.     Pupils: Pupils are equal, round, and reactive to light.  Neck:     Thyroid: No thyromegaly.     Vascular: No JVD.  Cardiovascular:     Rate and  Rhythm: Normal rate and regular rhythm.     Pulses: Intact distal pulses.     Heart sounds: Normal heart sounds. No murmur heard. No friction rub. No gallop.   Pulmonary:     Effort: Pulmonary effort is normal.     Breath sounds: Normal breath sounds.  Abdominal:     General: Bowel sounds are normal.     Palpations: Abdomen is soft. There is no mass.     Tenderness: There is no abdominal tenderness. There is no guarding.  Musculoskeletal:        General: No edema. Normal range of motion.     Cervical back: Normal range of motion and neck supple.  Lymphadenopathy:     Cervical: No cervical adenopathy.  Skin:    General: Skin is warm and dry.  Neurological:     Mental Status: She is alert.     Deep Tendon Reflexes: Reflexes are normal and symmetric.     Wt Readings from Last 3 Encounters:  06/12/20 134 lb (60.8 kg)  01/02/20 131 lb (59.4 kg)  11/11/19 133 lb (60.3 kg)    BP 120/82   Pulse 72   Ht 5\' 9"   (1.753 m)   Wt 134 lb (60.8 kg)   BMI 19.79 kg/m   Assessment and Plan:                                                     Patient's chart was reviewed for previous encounters, most recent labs, most recent imaging, and care everywhere. 1. Essential hypertension Chronic.  Controlled.  Stable.  Blood pressure today is 120/82.  We will continue amlodipine 5 mg once a day and losartan 50 mg once a day.  Will check renal function panel. - amLODipine (NORVASC) 5 MG tablet; Take 1 tablet (5 mg total) by mouth daily.  Dispense: 90 tablet; Refill: 1 - losartan (COZAAR) 50 MG tablet; Take 1 tablet (50 mg total) by mouth daily.  Dispense: 90 tablet; Refill: 1 - Renal Function Panel  2. 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  Controlled.  Stable.  Patient is not symptomatic secondary to hypothyroid given her review of systems.  Currently we will continue her levothyroxine 25 mcg daily and we will check a TSH today. - levothyroxine (SYNTHROID) 25 MCG tablet; Take 1 tablet (25 mcg total) by mouth daily.  Dispense: 90 tablet; Refill: 1 - TSH  3. Gastroesophageal reflux disease, unspecified whether esophagitis present Chronic.  Controlled.  Stable.  Continue pantoprazole 40 mg once a day. - pantoprazole (PROTONIX) 40 MG tablet; Take 1 tablet (40 mg total) by mouth daily.  Dispense: 90 tablet; Refill: 1  4. Hypercholesterolemia Review of labs notes that there is been some elevated of the LDL in the past.  We will check lipid panel for current status.  In the meantime patient's been encouraged to continue her low-cholesterol diet. - Lipid Panel With LDL/HDL Ratio  5. Lip lesion New problem.  Persistent problem.  Patient has noted a lesion on her lower lip for several months.  It is unresolved and continues at its current size.  Patient has been offered a dermatology consult but she is going to go by on her own to make an appointment and assures me that she will do so.

## 2020-06-13 LAB — LIPID PANEL WITH LDL/HDL RATIO
Cholesterol, Total: 194 mg/dL (ref 100–199)
HDL: 56 mg/dL (ref >39)
LDL Chol Calc (NIH): 125 mg/dL — ABNORMAL HIGH (ref 0–99)
LDL/HDL Ratio: 2.2 ratio (ref 0.0–3.2)
Triglycerides: 70 mg/dL (ref 0–149)
VLDL Cholesterol Cal: 13 mg/dL (ref 5–40)

## 2020-06-13 LAB — RENAL FUNCTION PANEL
Albumin: 4.4 g/dL (ref 3.7–4.7)
BUN/Creatinine Ratio: 14 (ref 12–28)
BUN: 10 mg/dL (ref 8–27)
CO2: 22 mmol/L (ref 20–29)
Calcium: 9.2 mg/dL (ref 8.7–10.3)
Chloride: 99 mmol/L (ref 96–106)
Creatinine, Ser: 0.69 mg/dL (ref 0.57–1.00)
GFR calc Af Amer: 99 mL/min/{1.73_m2} (ref >59)
GFR calc non Af Amer: 86 mL/min/{1.73_m2} (ref >59)
Glucose: 96 mg/dL (ref 65–99)
Phosphorus: 3.3 mg/dL (ref 3.0–4.3)
Potassium: 4.4 mmol/L (ref 3.5–5.2)
Sodium: 136 mmol/L (ref 134–144)

## 2020-06-13 LAB — TSH: TSH: 2.91 u[IU]/mL (ref 0.450–4.500)

## 2020-06-16 DIAGNOSIS — L570 Actinic keratosis: Secondary | ICD-10-CM | POA: Diagnosis not present

## 2020-07-23 DIAGNOSIS — L570 Actinic keratosis: Secondary | ICD-10-CM | POA: Diagnosis not present

## 2020-08-04 DIAGNOSIS — H903 Sensorineural hearing loss, bilateral: Secondary | ICD-10-CM | POA: Diagnosis not present

## 2020-08-17 DIAGNOSIS — L570 Actinic keratosis: Secondary | ICD-10-CM | POA: Diagnosis not present

## 2020-08-31 ENCOUNTER — Encounter: Payer: Medicare Other | Admitting: Family Medicine

## 2020-09-10 DIAGNOSIS — Z85828 Personal history of other malignant neoplasm of skin: Secondary | ICD-10-CM | POA: Diagnosis not present

## 2020-09-10 DIAGNOSIS — Z872 Personal history of diseases of the skin and subcutaneous tissue: Secondary | ICD-10-CM | POA: Diagnosis not present

## 2020-09-10 DIAGNOSIS — L570 Actinic keratosis: Secondary | ICD-10-CM | POA: Diagnosis not present

## 2020-09-10 DIAGNOSIS — L578 Other skin changes due to chronic exposure to nonionizing radiation: Secondary | ICD-10-CM | POA: Diagnosis not present

## 2020-09-18 ENCOUNTER — Ambulatory Visit (INDEPENDENT_AMBULATORY_CARE_PROVIDER_SITE_OTHER): Payer: Medicare Other | Admitting: Family Medicine

## 2020-09-18 ENCOUNTER — Other Ambulatory Visit: Payer: Self-pay

## 2020-09-18 ENCOUNTER — Encounter: Payer: Self-pay | Admitting: Family Medicine

## 2020-09-18 VITALS — BP 130/80 | HR 76 | Ht 69.0 in | Wt 132.0 lb

## 2020-09-18 DIAGNOSIS — Z1231 Encounter for screening mammogram for malignant neoplasm of breast: Secondary | ICD-10-CM | POA: Diagnosis not present

## 2020-09-18 DIAGNOSIS — Z0000 Encounter for general adult medical examination without abnormal findings: Secondary | ICD-10-CM | POA: Diagnosis not present

## 2020-09-18 DIAGNOSIS — Z1211 Encounter for screening for malignant neoplasm of colon: Secondary | ICD-10-CM | POA: Diagnosis not present

## 2020-09-18 NOTE — Progress Notes (Signed)
Date:  09/18/2020   Name:  Victoria Rush   DOB:  April 21, 1946   MRN:  [id number]   Chief Complaint: Annual Exam  Patient is a 75 year old female who presents for a comprehensive physical exam. The patient reports the following problems: gerd. Health maintenance has been reviewed up to date.   Lab Results  Component Value Date   CREATININE 0.69 06/12/2020   BUN 10 06/12/2020   NA 136 06/12/2020   K 4.4 06/12/2020   CL 99 06/12/2020   CO2 22 06/12/2020   Lab Results  Component Value Date   CHOL 194 06/12/2020   HDL 56 06/12/2020   LDLCALC 125 (H) 06/12/2020   TRIG 70 06/12/2020   CHOLHDL 3.2 08/28/2018   Lab Results  Component Value Date   TSH 2.910 06/12/2020   No results found for: HGBA1C No results found for: WBC, HGB, HCT, MCV, PLT No results found for: ALT, AST, GGT, ALKPHOS, BILITOT   Review of Systems  Constitutional: Negative.  Negative for chills, fatigue, fever and unexpected weight change.  HENT: Negative for congestion, ear discharge, ear pain, rhinorrhea, sinus pressure, sneezing and sore throat.   Eyes: Negative for photophobia, pain, discharge, redness and itching.  Respiratory: Negative for cough, shortness of breath, wheezing and stridor.   Gastrointestinal: Negative for abdominal pain, blood in stool, constipation, diarrhea, nausea and vomiting.  Endocrine: Negative for cold intolerance, heat intolerance, polydipsia, polyphagia and polyuria.  Genitourinary: Negative for dysuria, flank pain, frequency, hematuria, menstrual problem, pelvic pain, urgency, vaginal bleeding and vaginal discharge.  Musculoskeletal: Negative for arthralgias, back pain and myalgias.  Skin: Negative for rash.  Allergic/Immunologic: Negative for environmental allergies and food allergies.  Neurological: Negative for dizziness, weakness, light-headedness, numbness and headaches.  Hematological: Negative for adenopathy. Does not bruise/bleed easily.  Psychiatric/Behavioral:  Negative for dysphoric mood. The patient is not nervous/anxious.     Patient Active Problem List   Diagnosis Date Noted  . Quadriceps strain, right, initial encounter 08/23/2017  . Contact dermatitis 02/12/2016  . Esophageal reflux 02/12/2016  . Essential hypertension 02/12/2016  . Chronic seasonal allergic rhinitis due to pollen 12/04/2015  . 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 Hypercholesterolemia 01/30/2015    Allergies  Allergen Reactions  . Sulfa Antibiotics     Past Surgical History:  Procedure Laterality Date  . APPENDECTOMY    . COLONOSCOPY  2008   Dr Sonny Masters- repeat in 10 years  . SKIN CANCER EXCISION     foot    Social History   Tobacco Use  . Smoking status: Never Smoker  . Smokeless tobacco: Never Used  Substance Use Topics  . Alcohol use: No    Alcohol/week: 0.0 standard drinks  . Drug use: No     Medication list has been reviewed and updated.  Current Meds  Medication Sig  . amLODipine (NORVASC) 5 MG tablet Take 1 tablet (5 mg total) by mouth daily.  Marland Kitchen aspirin 81 MG tablet Take 81 mg by mouth daily.  . cetirizine (ZYRTEC) 10 MG tablet Take 1 tablet (10 mg total) by mouth daily.  Marland Kitchen etodolac (LODINE) 500 MG tablet Take 1 tablet (500 mg total) by mouth 2 (two) times daily.  Marland Kitchen levothyroxine (SYNTHROID) 25 MCG tablet Take 1 tablet (25 mcg total) by mouth daily.  Marland Kitchen losartan (COZAAR) 50 MG tablet Take 1 tablet (50 mg total) by mouth daily.  . montelukast (SINGULAIR) 10 MG tablet Take 1 tablet (10 mg total) by mouth at bedtime.  Marland Kitchen  pantoprazole (PROTONIX) 40 MG tablet Take 1 tablet (40 mg total) by mouth daily.    PHQ 2/9 Scores 09/18/2020 06/12/2020 01/02/2020 08/30/2019  PHQ - 2 Score 0 0 0 0  PHQ- 9 Score 0 0 0 0    GAD 7 : Generalized Anxiety Score 09/18/2020 06/12/2020 01/02/2020 08/30/2019  Nervous, Anxious, on Edge 0 0 0 0  Control/stop worrying 0 0 0 0  Worry too much - different things 0 0 0 0  Trouble relaxing 0 0 0 0  Restless 0 0 0 0  Easily annoyed  or irritable 0 0 0 0  Afraid - awful might happen 0 0 0 0  Total GAD 7 Score 0 0 0 0    BP Readings from Last 3 Encounters:  09/18/20 130/80  06/12/20 120/82  01/02/20 138/80    Physical Exam Vitals and nursing note reviewed.  Constitutional:      Appearance: Normal appearance. She is well-developed, well-groomed and normal weight.  HENT:     Head: Normocephalic.     Jaw: There is normal jaw occlusion.     Right Ear: Tympanic membrane, ear canal and external ear normal.     Left Ear: There is impacted cerumen.     Nose: Nose normal.     Mouth/Throat:     Lips: Pink.     Mouth: Mucous membranes are moist.     Pharynx: Oropharynx is clear. Uvula midline. No pharyngeal swelling, oropharyngeal exudate, posterior oropharyngeal erythema or uvula swelling.     Tonsils: No tonsillar exudate.  Eyes:     General: Lids are normal. Lids are everted, no foreign bodies appreciated. Vision grossly intact. No scleral icterus.       Left eye: No foreign body or hordeolum.     Conjunctiva/sclera: Conjunctivae normal.     Right eye: Right conjunctiva is not injected.     Left eye: Left conjunctiva is not injected.     Pupils: Pupils are equal, round, and reactive to light.     Funduscopic exam:    Right eye: Red reflex present.        Left eye: Red reflex present. Neck:     Thyroid: No thyroid mass, thyromegaly or thyroid tenderness.     Vascular: Normal carotid pulses. No carotid bruit, hepatojugular reflux or JVD.     Trachea: Trachea and phonation normal. No tracheal deviation.  Cardiovascular:     Rate and Rhythm: Normal rate and regular rhythm.     Chest Wall: PMI is not displaced.     Pulses: Normal pulses.          Carotid pulses are 2+ on the right side and 2+ on the left side.      Radial pulses are 2+ on the right side and 2+ on the left side.       Femoral pulses are 2+ on the right side and 2+ on the left side.      Popliteal pulses are 2+ on the right side and 2+ on the left  side.       Dorsalis pedis pulses are 2+ on the right side and 2+ on the left side.       Posterior tibial pulses are 2+ on the right side and 2+ on the left side.     Heart sounds: Normal heart sounds, S1 normal and S2 normal. No murmur heard.  No systolic murmur is present.  No diastolic murmur is present. No friction rub. No gallop. No  S3 or S4 sounds.   Pulmonary:     Effort: Pulmonary effort is normal. No respiratory distress.     Breath sounds: Normal breath sounds. No decreased breath sounds, wheezing, rhonchi or rales.  Chest:  Breasts: Breasts are symmetrical.     Right: Normal. No swelling, bleeding, inverted nipple, mass, nipple discharge, skin change, tenderness, axillary adenopathy or supraclavicular adenopathy.     Left: Normal. No swelling, bleeding, inverted nipple, mass, nipple discharge, skin change, tenderness, axillary adenopathy or supraclavicular adenopathy.    Abdominal:     General: Bowel sounds are normal.     Palpations: Abdomen is soft. There is no hepatomegaly, splenomegaly or mass.     Tenderness: There is no abdominal tenderness. There is no right CVA tenderness, left CVA tenderness, guarding or rebound.  Genitourinary:    Rectum: Normal. Guaiac result negative. No mass.  Musculoskeletal:        General: No tenderness. Normal range of motion.     Cervical back: Full passive range of motion without pain, normal range of motion and neck supple.     Right lower leg: No edema.     Left lower leg: No edema.  Lymphadenopathy:     Head:     Right side of head: No submandibular or tonsillar adenopathy.     Left side of head: No submandibular or tonsillar adenopathy.     Cervical: No cervical adenopathy.     Right cervical: No superficial, deep or posterior cervical adenopathy.    Left cervical: No superficial, deep or posterior cervical adenopathy.     Upper Body:     Right upper body: No supraclavicular or axillary adenopathy.     Left upper body: No  supraclavicular or axillary adenopathy.     Lower Body: No right inguinal adenopathy. No left inguinal adenopathy.  Skin:    General: Skin is warm.     Capillary Refill: Capillary refill takes less than 2 seconds.     Coloration: Skin is not ashen.     Findings: No abrasion or rash.  Neurological:     Mental Status: She is alert and oriented to person, place, and time.     Cranial Nerves: Cranial nerves are intact. No cranial nerve deficit.     Sensory: Sensation is intact.     Motor: Motor function is intact.     Deep Tendon Reflexes: Reflexes are normal and symmetric. Reflexes normal.  Psychiatric:        Mood and Affect: Mood is not anxious or depressed.     Wt Readings from Last 3 Encounters:  09/18/20 132 lb (59.9 kg)  06/12/20 134 lb (60.8 kg)  01/02/20 131 lb (59.4 kg)    BP 130/80   Pulse 76   Ht 5\' 9"  (1.753 m)   Wt 132 lb (59.9 kg)   BMI 19.49 kg/m   Assessment and Plan: 1. Annual physical exam No subjective objective concerns noted during history and physical exam.  Patient's chart was reviewed for previous encounters most recent labs, most recent imaging, and care everywhere.Shoni Quijas is a 75 y.o. female who presents today for her Complete Annual Exam. She feels well. She reports exercising . She reports she is sleeping well.Immunizations are reviewed and recommendations provided.   Age appropriate screening tests are discussed. Counseling given for risk factor reduction interventions.  Patient's labs were reviewed from previous evaluation and there acceptable at this time.  2. Breast cancer screening by mammogram Patient underwent a manual  exam with the breast bilateral and there is no palpable masses no adenopathy noted.  We will refer for breast screening by mammogram. - MM 3D SCREEN BREAST BILATERAL; Future  3. Encounter for screening colonoscopy The computer says that is been abstracted that she had colonoscopy in 2008 with Dr. Sonny Masters in perhaps a second 1  in 2018 however she is adamant that she has not had one in over 10 years.  I have not been able to find the actual report and so we called [facility] and it is in the process of being investigated but we will put in a referral and patient would like a colonoscopy as that is being greater than 10 years and we do not have the report to rely on from 2008 per Dr. Rolinda Roan evaluation.  In the meantime a DRE was done and there was no palpable mass and guaiac was negative. - Ambulatory referral to Gastroenterology

## 2020-10-08 ENCOUNTER — Ambulatory Visit
Admission: RE | Admit: 2020-10-08 | Discharge: 2020-10-08 | Disposition: A | Discharge status: Home / Self Care | Payer: Medicare Other | Source: Ambulatory Visit | Attending: Family Medicine | Admitting: Family Medicine

## 2020-10-08 ENCOUNTER — Other Ambulatory Visit: Payer: Self-pay

## 2020-10-08 DIAGNOSIS — Z1231 Encounter for screening mammogram for malignant neoplasm of breast: Secondary | ICD-10-CM | POA: Insufficient documentation

## 2020-10-14 ENCOUNTER — Ambulatory Visit (INDEPENDENT_AMBULATORY_CARE_PROVIDER_SITE_OTHER): Payer: Medicare Other

## 2020-10-14 DIAGNOSIS — Z0000 Encounter for general adult medical examination without abnormal findings: Secondary | ICD-10-CM | POA: Diagnosis not present

## 2020-10-14 NOTE — Progress Notes (Signed)
Subjective:   Victoria Rush is a 75 y.o. female who presents for Medicare Annual (Subsequent) preventive examination.  Virtual Visit via Telephone Note  I connected with  Victoria Rush on 10/14/20 at  8:00 AM EDT by telephone and verified that I am speaking with the correct person using two identifiers.  Location: Patient: home Provider: [facility] Persons participating in the virtual visit: [place]   I discussed the limitations, risks, security and privacy concerns of performing an evaluation and management service by telephone and the availability of in person appointments. The patient expressed understanding and agreed to proceed.  Interactive audio and video telecommunications were attempted between this nurse and patient, however failed, due to patient having technical difficulties OR patient did not have access to video capability.  We continued and completed visit with audio only.  Some vital signs may be absent or patient reported.   Clemetine Marker, LPN    Review of Systems     Cardiac Risk Factors include: advanced age (>103men, >54 women);hypertension     Objective:    There were no vitals filed for this visit. There is no height or weight on file to calculate BMI.  Advanced Directives 10/14/2020 01/30/2015  Does Patient Have a Medical Advance Directive? No No  Would patient like information on creating a medical advance directive? No - Patient declined No - patient declined information    Current Medications (verified) Outpatient Encounter Medications as of 10/14/2020  Medication Sig  . amLODipine (NORVASC) 5 MG tablet Take 1 tablet (5 mg total) by mouth daily.  Marland Kitchen aspirin 81 MG tablet Take 81 mg by mouth daily.  . cetirizine (ZYRTEC) 10 MG tablet Take 1 tablet (10 mg total) by mouth daily.  Marland Kitchen etodolac (LODINE) 500 MG tablet Take 1 tablet (500 mg total) by mouth 2 (two) times daily.  Marland Kitchen levothyroxine (SYNTHROID) 25 MCG tablet Take 1 tablet (25 mcg total)  by mouth daily.  Marland Kitchen losartan (COZAAR) 50 MG tablet Take 1 tablet (50 mg total) by mouth daily.  . montelukast (SINGULAIR) 10 MG tablet Take 1 tablet (10 mg total) by mouth at bedtime.  . pantoprazole (PROTONIX) 40 MG tablet Take 1 tablet (40 mg total) by mouth daily.   No facility-administered encounter medications on file as of 10/14/2020.    Allergies (verified) Sulfa antibiotics   History: Past Medical History:  Diagnosis Date  . Cancer ([place]) 1981   melanoma  . GERD (gastroesophageal reflux disease)   . Hyperlipidemia   . Hypertension   . Skin cancer 04/2019   on the nose  . Thyroid disease    Past Surgical History:  Procedure Laterality Date  . APPENDECTOMY    . COLONOSCOPY  2008   Dr Sonny Masters- repeat in 10 years  . SKIN CANCER EXCISION     foot   Family History  Problem Relation Age of Onset  . Breast cancer Cousin        mat cousin  . Diabetes Sister   . Diabetes Son    Social History   Socioeconomic History  . Marital status: Married    Spouse name: Not on file  . Number of children: 2  . Years of education: Not on file  . Highest education level: Not on file  Occupational History  . Not on file  Tobacco Use  . Smoking status: Never Smoker  . Smokeless tobacco: Never Used  Vaping Use  . Vaping Use: Never used  Substance and Sexual  Activity  . Alcohol use: No    Alcohol/week: 0.0 standard drinks  . Drug use: No  . Sexual activity: Not Currently  Other Topics Concern  . Not on file  Social History Narrative  . Not on file   Social Determinants of Health   Financial Resource Strain: Low Risk   . Difficulty of Paying Living Expenses: Not hard at all  Food Insecurity: No Food Insecurity  . Worried About Charity fundraiser in the Last Year: Never true  . Ran Out of Food in the Last Year: Never true  Transportation Needs: No Transportation Needs  . Lack of Transportation (Medical): No  . Lack of Transportation (Non-Medical): No  Physical Activity:  Insufficiently Active  . Days of Exercise per Week: 4 days  . Minutes of Exercise per Session: 20 min  Stress: No Stress Concern Present  . Feeling of Stress : Not at all  Social Connections: Moderately Isolated  . Frequency of Communication with Friends and Family: More than three times a week  . Frequency of Social Gatherings with Friends and Family: More than three times a week  . Attends Religious Services: Never  . Active Member of Clubs or Organizations: No  . Attends Archivist Meetings: Never  . Marital Status: Married    Tobacco Counseling Counseling given: Not Answered   Clinical Intake:  Pre-visit preparation completed: Yes  Pain : No/denies pain     Nutritional Risks: None Diabetes: No  How often do you need to have someone help you when you read instructions, pamphlets, or other written materials from your doctor or pharmacy?: 1 - Never    Interpreter Needed?: No  Information entered by :: Clemetine Marker LPN   Activities of Daily Living In your present state of health, do you have any difficulty performing the following activities: 10/14/2020 06/12/2020  Hearing? Tempie Donning  Comment wears hearing aids -  Vision? N N  Difficulty concentrating or making decisions? N N  Walking or climbing stairs? N N  Dressing or bathing? N N  Doing errands, shopping? N N  Preparing Food and eating ? N -  Using the Toilet? N -  In the past six months, have you accidently leaked urine? N -  Do you have problems with loss of bowel control? N -  Managing your Medications? N -  Managing your Finances? N -  Housekeeping or managing your Housekeeping? N -  Some recent data might be hidden    Patient Care Team: Juline Patch, MD as PCP - General (Family Medicine)  Indicate any recent Medical Services you may have received from other than [facility] providers in the past year (date may be approximate).     Assessment:   This is a routine wellness examination for  Victoria Rush.  Hearing/Vision screen  Hearing Screening   125Hz  250Hz  500Hz  1000Hz  2000Hz  3000Hz  4000Hz  6000Hz  8000Hz   Right ear:           Left ear:           Comments: Pt wears hearing aids   Vision Screening Comments: Annual vision screenings with Dr. Mallie Mussel  Dietary issues and exercise activities discussed: Current Exercise Habits: Home exercise routine, Type of exercise: walking, Time (Minutes): 20, Frequency (Times/Week): 4, Weekly Exercise (Minutes/Week): 80, Intensity: Mild, Exercise limited by: None identified  Goals   None    Depression Screen PHQ 2/9 Scores 10/14/2020 09/18/2020 06/12/2020 01/02/2020 08/30/2019 07/11/2019 01/16/2019  PHQ - 2 Score 0 0  0 0 0 0 0  PHQ- 9 Score - 0 0 0 0 0 0    Fall Risk Fall Risk  10/14/2020 01/02/2020 08/30/2019 07/11/2019 01/16/2019  Falls in the past year? 0 0 0 0 0  Number falls in past yr: 0 - - - -  Injury with Fall? 0 - - - -  Risk for fall due to : No Fall Risks - - - -  Follow up Falls prevention discussed Falls evaluation completed Falls evaluation completed Falls evaluation completed Falls evaluation completed    [place]:  Any stairs in or around the home? Yes If so, are there any without handrails? No  Home free of loose throw rugs in walkways, pet beds, electrical cords, etc? Yes  Adequate lighting in your home to reduce risk of falls? Yes   ASSISTIVE DEVICES UTILIZED TO PREVENT FALLS:  Life alert? No  Use of a cane, walker or w/c? No  Grab bars in the bathroom? Yes  Shower chair or bench in shower? No  Elevated toilet seat or a handicapped toilet? Yes   TIMED UP AND GO:  Was the test performed? No . Telephonic visit.   Cognitive Function:     6CIT Screen 08/22/2016  What Year? 0 points  What month? 0 points  What time? 0 points  Count back from 20 0 points  Months in reverse 0 points  Repeat phrase 0 points  Total Score 0    Immunizations Immunization History  Administered Date(s)  Administered  . Influenza, Seasonal, Injecte, Preservative Fre 04/30/2012  . Influenza,inj,Quad PF,6+ Mos 05/28/2013, 05/11/2015, 06/02/2016  . Influenza-Unspecified 03/05/2019, 03/05/2020  . PFIZER(Purple Top)SARS-COV-2 Vaccination 08/08/2019, 08/29/2019, 05/18/2020  . Pneumococcal Conjugate-13 08/14/2015  . Pneumococcal Polysaccharide-23 08/23/2017  . Tdap 08/15/2016    TDAP status: Up to date  Flu Vaccine status: Up to date  Pneumococcal vaccine status: Up to date  Covid-19 vaccine status: Completed vaccines  Qualifies for Shingles Vaccine? Yes   Zostavax completed No   Shingrix Completed?: No.    Education has been provided regarding the importance of this vaccine. Patient has been advised to call insurance company to determine out of pocket expense if they have not yet received this vaccine. Advised may also receive vaccine at local pharmacy or Health Dept. Verbalized acceptance and understanding.  Screening Tests Health Maintenance  Topic Date Due  . COVID-19 Vaccine (4 - Booster for Pfizer series) 11/15/2020  . INFLUENZA VACCINE  02/01/2021  . TETANUS/TDAP  08/15/2026  . COLONOSCOPY (Pts 45-67yrs Insurance coverage will need to be confirmed)  08/22/2026  . DEXA SCAN  Completed  . Hepatitis C Screening  Completed  . PNA vac Low Risk Adult  Completed  . HPV VACCINES  Aged Out    Health Maintenance  There are no preventive care reminders to display for this patient.  Colorectal cancer screening: Referral to GI placed 09/18/20. Pt aware the office will call re: appt.  Mammogram status: Completed 10/08/20. Repeat every year  Bone Density status: Completed 09/21/15. Results reflect: Bone density results: NORMAL. Repeat every 2 years. pt declines repeat screening.   Lung Cancer Screening: (Low Dose CT Chest recommended if Age 46-80 years, 30 pack-year currently smoking OR have quit w/in 15years.) does not qualify.  Additional Screening:  Hepatitis C Screening: does  qualify; Completed 08/14/15  Vision Screening: Recommended annual ophthalmology exams for early detection of glaucoma and other disorders of the eye. Is the patient up to date  with their annual eye exam?  Yes  Who is the provider or what is the name of the office in which the patient attends annual eye exams? Dr. Mallie Mussel  Dental Screening: Recommended annual dental exams for proper oral hygiene  Community Resource Referral / Chronic Care Management: CRR required this visit?  No   CCM required this visit?  No      Plan:     I have personally reviewed and noted the following in the patient's chart:   . Medical and social history . Use of alcohol, tobacco or illicit drugs  . Current medications and supplements . Functional ability and status . Nutritional status . Physical activity . Advanced directives . List of other physicians . Hospitalizations, surgeries, and ER visits in previous 12 months . Vitals . Screenings to include cognitive, depression, and falls . Referrals and appointments  In addition, I have reviewed and discussed with patient certain preventive protocols, quality metrics, and best practice recommendations. A written personalized care plan for preventive services as well as general preventive health recommendations were provided to patient.     Clemetine Marker, LPN   [id number]   Nurse Notes: none

## 2020-10-14 NOTE — Patient Instructions (Signed)
Victoria Rush , Thank you for taking time to come for your Medicare Wellness Visit. I appreciate your ongoing commitment to your health goals. Please review the following plan we discussed and let me know if I can assist you in the future.   Screening recommendations/referrals: Colonoscopy: due for repeat screening Mammogram: done 10/08/20 Bone Density: done 09/21/15 Recommended yearly ophthalmology/optometry visit for glaucoma screening and checkup Recommended yearly dental visit for hygiene and checkup  Vaccinations: Influenza vaccine: done 03/05/20 Pneumococcal vaccine: done 08/23/17 Tdap vaccine: done 08/15/16 Shingles vaccine: Shingrix discussed. Please contact your pharmacy for coverage information.  Covid-19: done 08/08/19, 08/29/19 & 05/18/20  Advanced directives: Advance directive discussed with you today. Even though you declined this today please call our office should you change your mind and we can give you the proper paperwork for you to fill out.  Conditions/risks identified: Recommend drinking 6-8 glasses of water per day   Next appointment: Follow up in one year for your annual wellness visit    Preventive Care 65 Years and Older, Female Preventive care refers to lifestyle choices and visits with your health care provider that can promote health and wellness. What does preventive care include?  A yearly physical exam. This is also called an annual well check.  Dental exams once or twice a year.  Routine eye exams. Ask your health care provider how often you should have your eyes checked.  Personal lifestyle choices, including:  Daily care of your teeth and gums.  Regular physical activity.  Eating a healthy diet.  Avoiding tobacco and drug use.  Limiting alcohol use.  Practicing safe sex.  Taking low-dose aspirin every day.  Taking vitamin and mineral supplements as recommended by your health care provider. What happens during an annual well check? The services  and screenings done by your health care provider during your annual well check will depend on your age, overall health, lifestyle risk factors, and family history of disease. Counseling  Your health care provider may ask you questions about your:  Alcohol use.  Tobacco use.  Drug use.  Emotional well-being.  Home and relationship well-being.  Sexual activity.  Eating habits.  History of falls.  Memory and ability to understand (cognition).  Work and work Statistician.  Reproductive health. Screening  You may have the following tests or measurements:  Height, weight, and BMI.  Blood pressure.  Lipid and cholesterol levels. These may be checked every 5 years, or more frequently if you are over 58 years old.  Skin check.  Lung cancer screening. You may have this screening every year starting at age 16 if you have a 30-pack-year history of smoking and currently smoke or have quit within the past 15 years.  Fecal occult blood test (FOBT) of the stool. You may have this test every year starting at age 17.  Flexible sigmoidoscopy or colonoscopy. You may have a sigmoidoscopy every 5 years or a colonoscopy every 10 years starting at age 16.  Hepatitis C blood test.  Hepatitis B blood test.  Sexually transmitted disease (STD) testing.  Diabetes screening. This is done by checking your blood sugar (glucose) after you have not eaten for a while (fasting). You may have this done every 1-3 years.  Bone density scan. This is done to screen for osteoporosis. You may have this done starting at age 32.  Mammogram. This may be done every 1-2 years. Talk to your health care provider about how often you should have regular mammograms. Talk with your  health care provider about your test results, treatment options, and if necessary, the need for more tests. Vaccines  Your health care provider may recommend certain vaccines, such as:  Influenza vaccine. This is recommended every  year.  Tetanus, diphtheria, and acellular pertussis (Tdap, Td) vaccine. You may need a Td booster every 10 years.  Zoster vaccine. You may need this after age 62.  Pneumococcal 13-valent conjugate (PCV13) vaccine. One dose is recommended after age 55.  Pneumococcal polysaccharide (PPSV23) vaccine. One dose is recommended after age 76. Talk to your health care provider about which screenings and vaccines you need and how often you need them. This information is not intended to replace advice given to you by your health care provider. Make sure you discuss any questions you have with your health care provider. Document Released: 07/17/2015 Document Revised: 03/09/2016 Document Reviewed: 04/21/2015 Elsevier Interactive Patient Education  2017 [place] Prevention in the Home Falls can cause injuries. They can happen to people of all ages. There are many things you can do to make your home safe and to help prevent falls. What can I do on the outside of my home?  Regularly fix the edges of walkways and driveways and fix any cracks.  Remove anything that might make you trip as you walk through a door, such as a raised step or threshold.  Trim any bushes or trees on the path to your home.  Use bright outdoor lighting.  Clear any walking paths of anything that might make someone trip, such as rocks or tools.  Regularly check to see if handrails are loose or broken. Make sure that both sides of any steps have handrails.  Any raised decks and porches should have guardrails on the edges.  Have any leaves, snow, or ice cleared regularly.  Use sand or salt on walking paths during winter.  Clean up any spills in your garage right away. This includes oil or grease spills. What can I do in the bathroom?  Use night lights.  Install grab bars by the toilet and in the tub and shower. Do not use towel bars as grab bars.  Use non-skid mats or decals in the tub or shower.  If you  need to sit down in the shower, use a plastic, non-slip stool.  Keep the floor dry. Clean up any water that spills on the floor as soon as it happens.  Remove soap buildup in the tub or shower regularly.  Attach bath mats securely with double-sided non-slip rug tape.  Do not have throw rugs and other things on the floor that can make you trip. What can I do in the bedroom?  Use night lights.  Make sure that you have a light by your bed that is easy to reach.  Do not use any sheets or blankets that are too big for your bed. They should not hang down onto the floor.  Have a firm chair that has side arms. You can use this for support while you get dressed.  Do not have throw rugs and other things on the floor that can make you trip. What can I do in the kitchen?  Clean up any spills right away.  Avoid walking on wet floors.  Keep items that you use a lot in easy-to-reach places.  If you need to reach something above you, use a strong step stool that has a grab bar.  Keep electrical cords out of the way.  Do not use  floor polish or wax that makes floors slippery. If you must use wax, use non-skid floor wax.  Do not have throw rugs and other things on the floor that can make you trip. What can I do with my stairs?  Do not leave any items on the stairs.  Make sure that there are handrails on both sides of the stairs and use them. Fix handrails that are broken or loose. Make sure that handrails are as long as the stairways.  Check any carpeting to make sure that it is firmly attached to the stairs. Fix any carpet that is loose or worn.  Avoid having throw rugs at the top or bottom of the stairs. If you do have throw rugs, attach them to the floor with carpet tape.  Make sure that you have a light switch at the top of the stairs and the bottom of the stairs. If you do not have them, ask someone to add them for you. What else can I do to help prevent falls?  Wear shoes  that:  Do not have high heels.  Have rubber bottoms.  Are comfortable and fit you well.  Are closed at the toe. Do not wear sandals.  If you use a stepladder:  Make sure that it is fully opened. Do not climb a closed stepladder.  Make sure that both sides of the stepladder are locked into place.  Ask someone to hold it for you, if possible.  Clearly mark and make sure that you can see:  Any grab bars or handrails.  First and last steps.  Where the edge of each step is.  Use tools that help you move around (mobility aids) if they are needed. These include:  Canes.  Walkers.  Scooters.  Crutches.  Turn on the lights when you go into a dark area. Replace any light bulbs as soon as they burn out.  Set up your furniture so you have a clear path. Avoid moving your furniture around.  If any of your floors are uneven, fix them.   If there are any pets around you, be aware of where they are.  Review your medicines with your doctor. Some medicines can make you feel dizzy. This can increase your chance of falling. Ask your doctor what other things that you can do to help prevent falls. This information is not intended to replace advice given to you by your health care provider. Make sure you discuss any questions you have with your health care provider. Document Released: 04/16/2009 Document Revised: 11/26/2015 Document Reviewed: 07/25/2014 Elsevier Interactive Patient Education  2017 [employer].

## 2020-10-27 DIAGNOSIS — L570 Actinic keratosis: Secondary | ICD-10-CM | POA: Diagnosis not present

## 2020-10-27 DIAGNOSIS — Z872 Personal history of diseases of the skin and subcutaneous tissue: Secondary | ICD-10-CM | POA: Diagnosis not present

## 2020-10-27 DIAGNOSIS — L821 Other seborrheic keratosis: Secondary | ICD-10-CM | POA: Diagnosis not present

## 2020-12-07 DIAGNOSIS — K219 Gastro-esophageal reflux disease without esophagitis: Secondary | ICD-10-CM | POA: Diagnosis not present

## 2020-12-07 DIAGNOSIS — Z1211 Encounter for screening for malignant neoplasm of colon: Secondary | ICD-10-CM | POA: Diagnosis not present

## 2020-12-15 ENCOUNTER — Other Ambulatory Visit: Payer: Self-pay

## 2020-12-15 ENCOUNTER — Ambulatory Visit (INDEPENDENT_AMBULATORY_CARE_PROVIDER_SITE_OTHER): Payer: Medicare Other | Admitting: Family Medicine

## 2020-12-15 ENCOUNTER — Encounter: Payer: Self-pay | Admitting: Family Medicine

## 2020-12-15 VITALS — BP 120/80 | HR 64 | Ht 69.0 in | Wt 127.0 lb

## 2020-12-15 DIAGNOSIS — J301 Allergic rhinitis due to pollen: Secondary | ICD-10-CM

## 2020-12-15 DIAGNOSIS — J0100 Acute maxillary sinusitis, unspecified: Secondary | ICD-10-CM

## 2020-12-15 MED ORDER — AZITHROMYCIN 250 MG PO TABS: ORAL_TABLET | ORAL | 0 refills | Status: AC

## 2020-12-15 MED ORDER — MONTELUKAST SODIUM 10 MG PO TABS
10.0000 mg | ORAL_TABLET | Freq: Every day | ORAL | 1 refills | Status: DC
Start: 2020-12-15 — End: 2021-04-22

## 2020-12-15 NOTE — Progress Notes (Signed)
Date:  12/15/2020   Name:  Victoria Rush   DOB:  1945/10/16   MRN:  [id number]   Chief Complaint: Sinusitis (Drainage, sinus pressure, yellow production, cough)  Sinusitis This is a new problem. The current episode started in the past 7 days. The problem has been gradually improving since onset. There has been no fever. The pain is mild. Associated symptoms include congestion, coughing, sinus pressure, sneezing and a sore throat. Pertinent negatives include no chills, ear pain, headaches, neck pain or shortness of breath. Past treatments include nothing.   Lab Results  Component Value Date   CREATININE 0.69 06/12/2020   BUN 10 06/12/2020   NA 136 06/12/2020   K 4.4 06/12/2020   CL 99 06/12/2020   CO2 22 06/12/2020   Lab Results  Component Value Date   CHOL 194 06/12/2020   HDL 56 06/12/2020   LDLCALC 125 (H) 06/12/2020   TRIG 70 06/12/2020   CHOLHDL 3.2 08/28/2018   Lab Results  Component Value Date   TSH 2.910 06/12/2020   No results found for: HGBA1C No results found for: WBC, HGB, HCT, MCV, PLT No results found for: ALT, AST, GGT, ALKPHOS, BILITOT   Review of Systems  Constitutional:  Negative for chills and fever.  HENT:  Positive for congestion, sinus pressure, sneezing and sore throat. Negative for drooling, ear discharge and ear pain.   Respiratory:  Positive for cough. Negative for shortness of breath and wheezing.   Cardiovascular:  Negative for chest pain, palpitations and leg swelling.  Gastrointestinal:  Negative for abdominal pain, blood in stool, constipation, diarrhea and nausea.  Endocrine: Negative for polydipsia.  Genitourinary:  Negative for dysuria, frequency, hematuria and urgency.  Musculoskeletal:  Negative for back pain, myalgias and neck pain.  Skin:  Negative for rash.  Allergic/Immunologic: Negative for environmental allergies.  Neurological:  Negative for dizziness and headaches.  Hematological:  Does not bruise/bleed easily.   Psychiatric/Behavioral:  Negative for suicidal ideas. The patient is not nervous/anxious.    Patient Active Problem List   Diagnosis Date Noted   Quadriceps strain, right, initial encounter 08/23/2017   Contact dermatitis 02/12/2016   Esophageal reflux 02/12/2016   Essential hypertension 02/12/2016   Chronic seasonal allergic rhinitis due to pollen 12/04/2015   Hypothyroidism 01/30/2015   Hypercholesterolemia 01/30/2015    Allergies  Allergen Reactions   Sulfa Antibiotics     Past Surgical History:  Procedure Laterality Date   APPENDECTOMY     COLONOSCOPY  2008   Dr Sonny Masters- repeat in 10 years   SKIN CANCER EXCISION     foot    Social History   Tobacco Use   Smoking status: Never   Smokeless tobacco: Never  Vaping Use   Vaping Use: Never used  Substance Use Topics   Alcohol use: No    Alcohol/week: 0.0 standard drinks   Drug use: No     Medication list has been reviewed and updated.  Current Meds  Medication Sig   amLODipine (NORVASC) 5 MG tablet Take 1 tablet (5 mg total) by mouth daily.   aspirin 81 MG tablet Take 81 mg by mouth daily.   cetirizine (ZYRTEC) 10 MG tablet Take 1 tablet (10 mg total) by mouth daily.   etodolac (LODINE) 500 MG tablet Take 1 tablet (500 mg total) by mouth 2 (two) times daily.   levothyroxine (SYNTHROID) 25 MCG tablet Take 1 tablet (25 mcg total) by mouth daily.   losartan (COZAAR) 50 MG tablet  Take 1 tablet (50 mg total) by mouth daily.   montelukast (SINGULAIR) 10 MG tablet Take 1 tablet (10 mg total) by mouth at bedtime.   pantoprazole (PROTONIX) 40 MG tablet Take 1 tablet (40 mg total) by mouth daily.    PHQ 2/9 Scores 10/14/2020 09/18/2020 06/12/2020 01/02/2020  PHQ - 2 Score 0 0 0 0  PHQ- 9 Score - 0 0 0    GAD 7 : Generalized Anxiety Score 09/18/2020 06/12/2020 01/02/2020 08/30/2019  Nervous, Anxious, on Edge 0 0 0 0  Control/stop worrying 0 0 0 0  Worry too much - different things 0 0 0 0  Trouble relaxing 0 0 0 0   Restless 0 0 0 0  Easily annoyed or irritable 0 0 0 0  Afraid - awful might happen 0 0 0 0  Total GAD 7 Score 0 0 0 0    BP Readings from Last 3 Encounters:  12/15/20 120/80  09/18/20 130/80  06/12/20 120/82    Physical Exam Vitals and nursing note reviewed.  Constitutional:      General: She is not in acute distress.    Appearance: She is not diaphoretic.  HENT:     Head: Normocephalic and atraumatic.     Right Ear: Tympanic membrane, ear canal and external ear normal.     Left Ear: Tympanic membrane, ear canal and external ear normal.     Nose: Congestion present. No rhinorrhea.     Right Sinus: Maxillary sinus tenderness present. No frontal sinus tenderness.     Left Sinus: No maxillary sinus tenderness or frontal sinus tenderness.     Mouth/Throat:     Mouth: Mucous membranes are moist.  Eyes:     General:        Right eye: No discharge.        Left eye: No discharge.     Conjunctiva/sclera: Conjunctivae normal.     Pupils: Pupils are equal, round, and reactive to light.  Neck:     Thyroid: No thyromegaly.     Vascular: No JVD.  Cardiovascular:     Rate and Rhythm: Normal rate and regular rhythm.     Heart sounds: Normal heart sounds. No murmur heard.   No friction rub. No gallop.  Pulmonary:     Effort: Pulmonary effort is normal.     Breath sounds: Normal breath sounds. No rales.  Chest:     Chest wall: No tenderness.  Abdominal:     General: Bowel sounds are normal.     Palpations: Abdomen is soft. There is no mass.     Tenderness: There is no abdominal tenderness. There is no guarding.  Musculoskeletal:        General: Normal range of motion.     Cervical back: Normal range of motion and neck supple.  Lymphadenopathy:     Cervical: No cervical adenopathy.  Skin:    General: Skin is warm and dry.  Neurological:     Mental Status: She is alert.     Deep Tendon Reflexes: Reflexes are normal and symmetric.    Wt Readings from Last 3 Encounters:   12/15/20 127 lb (57.6 kg)  09/18/20 132 lb (59.9 kg)  06/12/20 134 lb (60.8 kg)    BP 120/80   Pulse 64   Ht 5\' 9"  (1.753 m)   Wt 127 lb (57.6 kg)   BMI 18.75 kg/m   Assessment and Plan:  1. Acute maxillary sinusitis, recurrence not specified New onset.  Persistent.  Stable.  Uncomplicated.  Exam and history is consistent with maxillary sinusitis and we will treat it with azithromycin to 50 mg 2 today followed by 1 a day for 4 days. - azithromycin (ZITHROMAX) 250 MG tablet; Take 2 tablets on day 1, then 1 tablet daily on days 2 through 5  Dispense: 6 tablet; Refill: 0  2. Chronic seasonal allergic rhinitis due to pollen Chronic.  Persistent.  Episodic.  We will refill the patient's Singulair 10 mg once a day to be used during this pollen season and only when we have frost season to illuminate all pollens. - montelukast (SINGULAIR) 10 MG tablet; Take 1 tablet (10 mg total) by mouth at bedtime.  Dispense: 90 tablet; Refill: 1

## 2020-12-21 DIAGNOSIS — D485 Neoplasm of uncertain behavior of skin: Secondary | ICD-10-CM | POA: Diagnosis not present

## 2020-12-21 DIAGNOSIS — L308 Other specified dermatitis: Secondary | ICD-10-CM | POA: Diagnosis not present

## 2020-12-21 DIAGNOSIS — L720 Epidermal cyst: Secondary | ICD-10-CM | POA: Diagnosis not present

## 2021-01-05 ENCOUNTER — Encounter: Payer: Self-pay | Admitting: Family Medicine

## 2021-01-05 ENCOUNTER — Other Ambulatory Visit: Payer: Self-pay

## 2021-01-05 ENCOUNTER — Ambulatory Visit (INDEPENDENT_AMBULATORY_CARE_PROVIDER_SITE_OTHER): Payer: Medicare Other | Admitting: Family Medicine

## 2021-01-05 VITALS — BP 138/80 | HR 78 | Ht 69.0 in | Wt 127.0 lb

## 2021-01-05 DIAGNOSIS — I10 Essential (primary) hypertension: Secondary | ICD-10-CM

## 2021-01-05 DIAGNOSIS — K219 Gastro-esophageal reflux disease without esophagitis: Secondary | ICD-10-CM

## 2021-01-05 DIAGNOSIS — E7800 Pure hypercholesterolemia, unspecified: Secondary | ICD-10-CM | POA: Diagnosis not present

## 2021-01-05 DIAGNOSIS — E032 Hypothyroidism due to medicaments and other exogenous substances: Secondary | ICD-10-CM | POA: Diagnosis not present

## 2021-01-05 MED ORDER — AMLODIPINE BESYLATE 5 MG PO TABS
5.0000 mg | ORAL_TABLET | Freq: Every day | ORAL | 1 refills | Status: DC
Start: 2021-01-05 — End: 2021-04-22

## 2021-01-05 MED ORDER — LOSARTAN POTASSIUM 50 MG PO TABS
50.0000 mg | ORAL_TABLET | Freq: Every day | ORAL | 1 refills | Status: DC
Start: 2021-01-05 — End: 2021-04-22

## 2021-01-05 MED ORDER — LEVOTHYROXINE SODIUM 25 MCG PO TABS
25.0000 ug | ORAL_TABLET | Freq: Every day | ORAL | 1 refills | Status: DC
Start: 2021-01-05 — End: 2021-04-22

## 2021-01-05 MED ORDER — PANTOPRAZOLE SODIUM 40 MG PO TBEC: 40.0000 mg | DELAYED_RELEASE_TABLET | Freq: Every day | ORAL | 1 refills | Status: DC

## 2021-01-05 NOTE — Progress Notes (Signed)
Date:  01/05/2021   Name:  Victoria Rush   DOB:  March 24, 1946   MRN:  [id number]   Chief Complaint: Hypertension, Gastroesophageal Reflux, and Hypothyroidism  Hypertension This is a chronic problem. The current episode started more than 1 year ago. The problem has been gradually improving since onset. The problem is controlled. Pertinent negatives include no anxiety, blurred vision, chest pain, headaches, malaise/fatigue, neck pain, orthopnea, palpitations, peripheral edema, PND, shortness of breath or sweats. There are no associated agents to hypertension. There are no known risk factors for coronary artery disease. Past treatments include calcium channel blockers and angiotensin blockers. The current treatment provides moderate improvement. There are no compliance problems.  There is no history of angina, kidney disease, CAD/MI, CVA, heart failure, left ventricular hypertrophy, PVD or retinopathy. There is no history of chronic renal disease, a hypertension causing med or renovascular disease.  Gastroesophageal Reflux She reports no abdominal pain, no belching, no chest pain, no choking, no coughing, no dysphagia, no early satiety, no globus sensation, no heartburn, no hoarse voice, no nausea, no sore throat, no stridor, no tooth decay, no water brash or no wheezing. This is a chronic problem. The problem occurs occasionally. The problem has been gradually improving. The symptoms are aggravated by certain foods. Pertinent negatives include no anemia, fatigue, melena, muscle weakness, orthopnea or weight loss. She has tried a PPI for the symptoms. The treatment provided moderate relief.   Lab Results  Component Value Date   CREATININE 0.69 06/12/2020   BUN 10 06/12/2020   NA 136 06/12/2020   K 4.4 06/12/2020   CL 99 06/12/2020   CO2 22 06/12/2020   Lab Results  Component Value Date   CHOL 194 06/12/2020   HDL 56 06/12/2020   LDLCALC 125 (H) 06/12/2020   TRIG 70 06/12/2020   CHOLHDL 3.2  08/28/2018   Lab Results  Component Value Date   TSH 2.910 06/12/2020   No results found for: HGBA1C No results found for: WBC, HGB, HCT, MCV, PLT No results found for: ALT, AST, GGT, ALKPHOS, BILITOT   Review of Systems  Constitutional:  Negative for chills, fatigue, fever, malaise/fatigue and weight loss.  HENT:  Negative for drooling, ear discharge, ear pain, hoarse voice and sore throat.   Eyes:  Negative for blurred vision.  Respiratory:  Negative for cough, choking, shortness of breath and wheezing.   Cardiovascular:  Negative for chest pain, palpitations, orthopnea, leg swelling and PND.  Gastrointestinal:  Negative for abdominal pain, blood in stool, constipation, diarrhea, dysphagia, heartburn, melena and nausea.  Endocrine: Negative for polydipsia.  Genitourinary:  Negative for dysuria, frequency, hematuria and urgency.  Musculoskeletal:  Negative for back pain, myalgias, muscle weakness and neck pain.  Skin:  Negative for rash.  Allergic/Immunologic: Negative for environmental allergies.  Neurological:  Negative for dizziness and headaches.  Hematological:  Does not bruise/bleed easily.  Psychiatric/Behavioral:  Negative for suicidal ideas. The patient is not nervous/anxious.    Patient Active Problem List   Diagnosis Date Noted   Quadriceps strain, right, initial encounter 08/23/2017   Contact dermatitis 02/12/2016   Esophageal reflux 02/12/2016   Essential hypertension 02/12/2016   Chronic seasonal allergic rhinitis due to pollen 12/04/2015   Hypothyroidism 01/30/2015   Hypercholesterolemia 01/30/2015    Allergies  Allergen Reactions   Sulfa Antibiotics     Past Surgical History:  Procedure Laterality Date   APPENDECTOMY     COLONOSCOPY  2008   Dr Sonny Masters- repeat in 10  years   SKIN CANCER EXCISION     foot    Social History   Tobacco Use   Smoking status: Never   Smokeless tobacco: Never  Vaping Use   Vaping Use: Never used  Substance Use Topics    Alcohol use: No    Alcohol/week: 0.0 standard drinks   Drug use: No     Medication list has been reviewed and updated.  Current Meds  Medication Sig   amLODipine (NORVASC) 5 MG tablet Take 1 tablet (5 mg total) by mouth daily.   aspirin 81 MG tablet Take 81 mg by mouth daily.   cetirizine (ZYRTEC) 10 MG tablet Take 1 tablet (10 mg total) by mouth daily.   levothyroxine (SYNTHROID) 25 MCG tablet Take 1 tablet (25 mcg total) by mouth daily.   losartan (COZAAR) 50 MG tablet Take 1 tablet (50 mg total) by mouth daily.   montelukast (SINGULAIR) 10 MG tablet Take 1 tablet (10 mg total) by mouth at bedtime.   pantoprazole (PROTONIX) 40 MG tablet Take 1 tablet (40 mg total) by mouth daily.    PHQ 2/9 Scores 10/14/2020 09/18/2020 06/12/2020 01/02/2020  PHQ - 2 Score 0 0 0 0  PHQ- 9 Score - 0 0 0    GAD 7 : Generalized Anxiety Score 09/18/2020 06/12/2020 01/02/2020 08/30/2019  Nervous, Anxious, on Edge 0 0 0 0  Control/stop worrying 0 0 0 0  Worry too much - different things 0 0 0 0  Trouble relaxing 0 0 0 0  Restless 0 0 0 0  Easily annoyed or irritable 0 0 0 0  Afraid - awful might happen 0 0 0 0  Total GAD 7 Score 0 0 0 0    BP Readings from Last 3 Encounters:  01/05/21 138/80  12/15/20 120/80  09/18/20 130/80    Physical Exam Vitals and nursing note reviewed.  Constitutional:      General: She is not in acute distress.    Appearance: She is not diaphoretic.  HENT:     Head: Normocephalic and atraumatic.     Right Ear: Tympanic membrane, ear canal and external ear normal. There is no impacted cerumen.     Left Ear: Tympanic membrane, ear canal and external ear normal. There is no impacted cerumen.     Nose: Nose normal. No congestion or rhinorrhea.  Eyes:     General:        Right eye: No discharge.        Left eye: No discharge.     Conjunctiva/sclera: Conjunctivae normal.     Pupils: Pupils are equal, round, and reactive to light.  Neck:     Thyroid: No thyromegaly.      Vascular: No JVD.  Cardiovascular:     Rate and Rhythm: Normal rate and regular rhythm.     Heart sounds: Normal heart sounds, S1 normal and S2 normal. No murmur heard. No systolic murmur is present.  No diastolic murmur is present.    No friction rub. No gallop. No S3 or S4 sounds.  Pulmonary:     Effort: Pulmonary effort is normal.     Breath sounds: Normal breath sounds. No wheezing or rhonchi.  Abdominal:     General: Bowel sounds are normal.     Palpations: Abdomen is soft. There is no mass.     Tenderness: There is no abdominal tenderness. There is no guarding.  Musculoskeletal:        General: Normal range of motion.  Cervical back: Normal range of motion and neck supple.  Lymphadenopathy:     Cervical: No cervical adenopathy.  Skin:    General: Skin is warm and dry.  Neurological:     Mental Status: She is alert.     Deep Tendon Reflexes: Reflexes are normal and symmetric.    Wt Readings from Last 3 Encounters:  01/05/21 127 lb (57.6 kg)  12/15/20 127 lb (57.6 kg)  09/18/20 132 lb (59.9 kg)    BP 138/80   Pulse 78   Ht 5\' 9"  (1.753 m)   Wt 127 lb (57.6 kg)   BMI 18.75 kg/m   Assessment and Plan:  1. Essential hypertension Chronic.  Controlled.  Stable.  Blood pressure is 138/80.  It is usually lower but patient did not take medication due to being fasting this morning I told her it would be fine to take a small meal when she comes next time.  She will continue amlodipine 5 mg once a day and losartan 50 mg once a day.  Will check renal function panel for electrolytes and renal function status. - amLODipine (NORVASC) 5 MG tablet; Take 1 tablet (5 mg total) by mouth daily.  Dispense: 90 tablet; Refill: 1 - losartan (COZAAR) 50 MG tablet; Take 1 tablet (50 mg total) by mouth daily.  Dispense: 90 tablet; Refill: 1 - Renal Function Panel  2. 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  Controlled.  Stable.  We will check thyroid panel with  TSH and likely continue Synthroid 25 mcg daily. - levothyroxine (SYNTHROID) 25 MCG tablet; Take 1 tablet (25 mcg total) by mouth daily.  Dispense: 90 tablet; Refill: 1 - Thyroid Panel With TSH  3. Gastroesophageal reflux disease, unspecified whether esophagitis present Chronic.  Controlled.  Stable.  Continue pantoprazole 40 mg once a day. - pantoprazole (PROTONIX) 40 MG tablet; Take 1 tablet (40 mg total) by mouth daily.  Dispense: 90 tablet; Refill: 1  4. Hypercholesterolemia Review of labs patient has had elevated LDLs in the past but mildly so.  Will check lipid panel and will adjust accordingly. - Lipid Panel With LDL/HDL Ratio

## 2021-01-06 LAB — RENAL FUNCTION PANEL
Albumin: 4.5 g/dL (ref 3.7–4.7)
BUN/Creatinine Ratio: 10 — ABNORMAL LOW (ref 12–28)
BUN: 9 mg/dL (ref 8–27)
CO2: 25 mmol/L (ref 20–29)
Calcium: 9.5 mg/dL (ref 8.7–10.3)
Chloride: 94 mmol/L — ABNORMAL LOW (ref 96–106)
Creatinine, Ser: 0.86 mg/dL (ref 0.57–1.00)
Glucose: 113 mg/dL — ABNORMAL HIGH (ref 65–99)
Phosphorus: 3.6 mg/dL (ref 3.0–4.3)
Potassium: 4.5 mmol/L (ref 3.5–5.2)
Sodium: 132 mmol/L — ABNORMAL LOW (ref 134–144)
eGFR: 70 mL/min/{1.73_m2} (ref >59)

## 2021-01-06 LAB — LIPID PANEL WITH LDL/HDL RATIO
Cholesterol, Total: 215 mg/dL — ABNORMAL HIGH (ref 100–199)
HDL: 61 mg/dL (ref >39)
LDL Chol Calc (NIH): 134 mg/dL — ABNORMAL HIGH (ref 0–99)
LDL/HDL Ratio: 2.2 ratio (ref 0.0–3.2)
Triglycerides: 112 mg/dL (ref 0–149)
VLDL Cholesterol Cal: 20 mg/dL (ref 5–40)

## 2021-01-06 LAB — THYROID PANEL WITH TSH
Free Thyroxine Index: 1.7 (ref 1.2–4.9)
T3 Uptake Ratio: 22 % — ABNORMAL LOW (ref 24–39)
T4, Total: 7.7 ug/dL (ref 4.5–12.0)
TSH: 1.9 u[IU]/mL (ref 0.450–4.500)

## 2021-03-03 ENCOUNTER — Ambulatory Visit: Admission: RE | Admit: 2021-03-03 | Payer: Medicare Other | Source: Home / Self Care | Admitting: Internal Medicine

## 2021-03-03 ENCOUNTER — Encounter: Admission: RE | Payer: Self-pay | Source: Home / Self Care

## 2021-03-03 SURGERY — COLONOSCOPY WITH PROPOFOL
Anesthesia: General

## 2021-03-23 ENCOUNTER — Encounter: Payer: Self-pay | Admitting: Family Medicine

## 2021-03-23 ENCOUNTER — Ambulatory Visit (INDEPENDENT_AMBULATORY_CARE_PROVIDER_SITE_OTHER): Payer: Medicare Other | Admitting: Family Medicine

## 2021-03-23 ENCOUNTER — Other Ambulatory Visit: Payer: Self-pay

## 2021-03-23 VITALS — BP 120/70 | HR 64 | Ht 69.0 in | Wt 128.0 lb

## 2021-03-23 DIAGNOSIS — Z23 Encounter for immunization: Secondary | ICD-10-CM

## 2021-03-23 DIAGNOSIS — M659 Synovitis and tenosynovitis, unspecified: Secondary | ICD-10-CM | POA: Diagnosis not present

## 2021-03-23 MED ORDER — MELOXICAM 7.5 MG PO TABS: 7.5000 mg | ORAL_TABLET | Freq: Every day | ORAL | 0 refills | Status: DC

## 2021-03-23 NOTE — Progress Notes (Signed)
Date:  03/23/2021   Name:  Victoria Rush   DOB:  03/10/1946   MRN:  [id number]   Chief Complaint: Flu Vaccine and Foot Swelling (R) foot pain and swelling- no known injury. Taking Tylenol arthritis- seemed to help)  Foot Injury  The incident occurred more than 1 week ago (does not recall an injury). There was no injury mechanism (repetitive dorsiflexion/flip flops). The pain is present in the right foot. The quality of the pain is described as aching and burning. The pain is at a severity of 4/10. The pain is mild. The pain has been Intermittent since onset. Associated symptoms include tingling. Pertinent negatives include no inability to bear weight, loss of motion, loss of sensation, muscle weakness or numbness. Associated symptoms comments: Hurts to walk. She reports no foreign bodies present. The symptoms are aggravated by weight bearing (walking). She has tried acetaminophen for the symptoms. The treatment provided mild relief.   Lab Results  Component Value Date   CREATININE 0.86 01/05/2021   BUN 9 01/05/2021   NA 132 (L) 01/05/2021   K 4.5 01/05/2021   CL 94 (L) 01/05/2021   CO2 25 01/05/2021   Lab Results  Component Value Date   CHOL 215 (H) 01/05/2021   HDL 61 01/05/2021   LDLCALC 134 (H) 01/05/2021   TRIG 112 01/05/2021   CHOLHDL 3.2 08/28/2018   Lab Results  Component Value Date   TSH 1.900 01/05/2021   No results found for: HGBA1C No results found for: WBC, HGB, HCT, MCV, PLT No results found for: ALT, AST, GGT, ALKPHOS, BILITOT   Review of Systems  Constitutional:  Negative for chills and fever.  HENT:  Negative for drooling, ear discharge, ear pain and sore throat.   Respiratory:  Negative for cough, shortness of breath and wheezing.   Cardiovascular:  Negative for chest pain, palpitations and leg swelling.  Gastrointestinal:  Negative for abdominal pain, blood in stool, constipation, diarrhea and nausea.  Endocrine: Negative for polydipsia.  Genitourinary:   Negative for dysuria, frequency, hematuria and urgency.  Musculoskeletal:  Negative for back pain, myalgias and neck pain.  Skin:  Negative for rash.  Allergic/Immunologic: Negative for environmental allergies.  Neurological:  Positive for tingling. Negative for dizziness, numbness and headaches.  Hematological:  Does not bruise/bleed easily.  Psychiatric/Behavioral:  Negative for suicidal ideas. The patient is not nervous/anxious.    Patient Active Problem List   Diagnosis Date Noted   Quadriceps strain, right, initial encounter 08/23/2017   Contact dermatitis 02/12/2016   Esophageal reflux 02/12/2016   Essential hypertension 02/12/2016   Chronic seasonal allergic rhinitis due to pollen 12/04/2015   Hypothyroidism 01/30/2015   Hypercholesterolemia 01/30/2015    Allergies  Allergen Reactions   Sulfa Antibiotics     Past Surgical History:  Procedure Laterality Date   APPENDECTOMY     COLONOSCOPY  2008   Dr Sonny Masters- repeat in 10 years   SKIN CANCER EXCISION     foot    Social History   Tobacco Use   Smoking status: Never   Smokeless tobacco: Never  Vaping Use   Vaping Use: Never used  Substance Use Topics   Alcohol use: No    Alcohol/week: 0.0 standard drinks   Drug use: No     Medication list has been reviewed and updated.  Current Meds  Medication Sig   amLODipine (NORVASC) 5 MG tablet Take 1 tablet (5 mg total) by mouth daily.   cetirizine (ZYRTEC) 10 MG  tablet Take 1 tablet (10 mg total) by mouth daily.   levothyroxine (SYNTHROID) 25 MCG tablet Take 1 tablet (25 mcg total) by mouth daily.   losartan (COZAAR) 50 MG tablet Take 1 tablet (50 mg total) by mouth daily.   montelukast (SINGULAIR) 10 MG tablet Take 1 tablet (10 mg total) by mouth at bedtime.   pantoprazole (PROTONIX) 40 MG tablet Take 1 tablet (40 mg total) by mouth daily.   [DISCONTINUED] aspirin 81 MG tablet Take 81 mg by mouth daily.    PHQ 2/9 Scores 10/14/2020 09/18/2020 06/12/2020 01/02/2020   PHQ - 2 Score 0 0 0 0  PHQ- 9 Score - 0 0 0    GAD 7 : Generalized Anxiety Score 09/18/2020 06/12/2020 01/02/2020 08/30/2019  Nervous, Anxious, on Edge 0 0 0 0  Control/stop worrying 0 0 0 0  Worry too much - different things 0 0 0 0  Trouble relaxing 0 0 0 0  Restless 0 0 0 0  Easily annoyed or irritable 0 0 0 0  Afraid - awful might happen 0 0 0 0  Total GAD 7 Score 0 0 0 0    BP Readings from Last 3 Encounters:  01/05/21 138/80  12/15/20 120/80  09/18/20 130/80    Physical Exam Vitals and nursing note reviewed.  Constitutional:      Appearance: She is well-developed.  HENT:     Head: Normocephalic.     Right Ear: External ear normal.     Left Ear: External ear normal.  Eyes:     General: Lids are everted, no foreign bodies appreciated. No scleral icterus.       Left eye: No foreign body or hordeolum.     Conjunctiva/sclera: Conjunctivae normal.     Right eye: Right conjunctiva is not injected.     Left eye: Left conjunctiva is not injected.     Pupils: Pupils are equal, round, and reactive to light.  Neck:     Thyroid: No thyromegaly.     Vascular: No JVD.     Trachea: No tracheal deviation.  Cardiovascular:     Rate and Rhythm: Normal rate and regular rhythm.     Heart sounds: Normal heart sounds. No murmur heard.   No friction rub. No gallop.  Pulmonary:     Effort: Pulmonary effort is normal. No respiratory distress.     Breath sounds: Normal breath sounds. No wheezing or rales.  Abdominal:     General: Bowel sounds are normal.     Palpations: Abdomen is soft. There is no mass.     Tenderness: There is no abdominal tenderness. There is no guarding or rebound.  Musculoskeletal:        General: Normal range of motion.     Cervical back: Normal range of motion and neck supple.     Right foot: Normal range of motion. Tenderness present.       Legs:     Comments: Pain with resisted dorsiflexion  Lymphadenopathy:     Cervical: No cervical adenopathy.  Skin:     General: Skin is warm.     Findings: No rash.  Neurological:     Mental Status: She is alert and oriented to person, place, and time.     Cranial Nerves: No cranial nerve deficit.     Deep Tendon Reflexes: Reflexes normal.  Psychiatric:        Mood and Affect: Mood is not anxious or depressed.    Wt Readings from  Last 3 Encounters:  03/23/21 128 lb (58.1 kg)  01/05/21 127 lb (57.6 kg)  12/15/20 127 lb (57.6 kg)    Ht 5\' 9"  (1.753 m)   Wt 128 lb (58.1 kg)   BMI 18.90 kg/m   Assessment and Plan:  1. Tenosynovitis New onset.  Persistent for the past 2 weeks.  Patient has had tenderness and pain over the medial dorsal aspect of right foot.  There is tenderness over the tendon responsible for dorsiflexion.  Patient has pain with dorsiflexion that is resisted consistent with tenosynovitis and we will treat with meloxicam 7.5 mg once a day and ice massage over the area of pain. - meloxicam (MOBIC) 7.5 MG tablet; Take 1 tablet (7.5 mg total) by mouth daily.  Dispense: 30 tablet; Refill: 0  2. Need for immunization against influenza Discussed and administered. - Flu Vaccine QUAD High Dose(Fluad)

## 2021-03-23 NOTE — Patient Instructions (Signed)
Tenosynovitis Tenosynovitis is inflammation of a tendon and of the sleeve of tissue that covers the tendon (tendon sheath). A tendon is a cord of tissue that connects muscle to bone. Normally, a tendon slides smoothly inside its tendon sheath. Tenosynovitis limits movement of the tendon and surrounding tissues, which may cause pain and stiffness. Tenosynovitis can affect any tendon and tendon sheath. Commonly affected areas include tendons in the: Wrist. Arm. Hand. Hip. Leg. Foot. Shoulder. What are the causes? The main cause of this condition is wear and tear over time that results in slight tears in the tendon. Other possible causes include: An injury to the tendon or tendon sheath. A disease that causes inflammation in the body. An infection that spreads to the tendon and tendon sheath from a skin wound. An infection in another part of the body that spreads to the tendon and tendon sheath through the blood. What increases the risk? The following factors may make you more likely to develop this condition: Having rheumatoid arthritis, gout, or diabetes. Using IV drugs. Doing physical activities that can cause tendon overuse and stress. Having gonorrhea. What are the signs or symptoms? Symptoms of this condition depend on the cause. Symptoms may include: Pain with movement. Pain when pressing on the tendon and tendon sheath. Swelling. Stiffness. If tenosynovitis is caused by an infection, symptoms may include: Fever. Redness. Warmth. How is this diagnosed? This condition may be diagnosed based on your medical history and a physical exam. You also may have: Blood tests. Imaging tests, such as: MRI. Ultrasound. A sample of fluid removed from inside the tendon sheath to be checked in a lab. How is this treated? Treatment for this condition depends on the cause. If tenosynovitis is not caused by an infection, treatment may include: Rest. Keeping the tendon in place  (immobilization) in a splint, brace, or sling. Taking NSAIDs to reduce pain and swelling. A shot (injection) of medicine to help reduce pain and swelling (steroid). Icing or applying heat to the affected area. Physical therapy. Surgery to release the tendon in the sheath or to repair damage to the tendon or tendon sheath. Surgery may be done if other treatments do not help relieve symptoms. If tenosynovitis is caused by infection, treatment may include antibiotic medicine given through an IV. In some cases, surgery may be needed to drain fluid from the tendon sheath or to remove the tendon sheath. Follow these instructions at home: If you have a splint, brace, or sling:  Wear the splint, brace, or sling as told by your health care provider. Remove it only as told by your health care provider. Loosen the splint, brace, or sling if your fingers or toes tingle, become numb, or turn cold and blue. Keep the splint, brace, or sling clean. If the splint, brace, or sling is not waterproof: Do not let it get wet. Cover it with a watertight covering when you take a bath or shower. Managing pain, stiffness, and swelling  If directed, put ice on the affected area. Put ice in a plastic bag. Place a towel between your skin and the bag. Leave the ice on for 20 minutes, 2-3 times a day. Move the fingers or toes of the affected limb often, if this applies. This can help to reduce stiffness and swelling. If directed, raise (elevate) the affected area above the level of your heart while you are sitting or lying down. If directed, apply heat to the affected area before you exercise. Use the heat source that   your health care provider recommends, such as a moist heat pack or a heating pad. Place a towel between your skin and the heat source. Leave the heat on for 20-30 minutes. Remove the heat if your skin turns bright red. This is especially important if you are unable to feel pain, heat, or cold. You may have  a greater risk of getting burned. Medicines Take over-the-counter and prescription medicines only as told by your health care provider. Ask your health care provider if the medicine prescribed to you: Requires you to avoid driving or using heavy machinery. Can cause constipation. You may need to take actions to prevent or treat constipation, such as: Drink enough fluid to keep your urine pale yellow. Take over-the-counter or prescription medicines. Eat foods that are high in fiber, such as beans, whole grains, and fresh fruits and vegetables. Limit foods that are high in fat and processed sugars, such as fried or sweet foods. Activity Return to your normal activities as told by your health care provider. Ask your health care provider what activities are safe for you. Rest the affected area as told by your health care provider. Avoid using the affected area while you are having symptoms. Do not use the injured limb to support your body weight until your health care provider says that you can. If physical therapy was prescribed, do exercises as told by your health care provider. General instructions Ask your health care provider when it is safe to drive if you have a splint or brace on any part of your arm or leg. Keep all follow-up visits as told by your health care provider. This is important. Contact a health care provider if: Your symptoms are not improving or are getting worse. Get help right away if: Your fingers or toes become numb or turn blue. You have a fever and more of any of the following symptoms: Pain. Redness. Warmth. Swelling. Summary Tenosynovitis is inflammation of a tendon and of the sleeve of tissue that covers the tendon (tendon sheath). Treatment for this condition depends on the cause. Treatment may include rest, medicines, physical therapy, or surgery. Contact a health care provider if your symptoms are not improving or are getting worse. Keep all follow-up  visits as told by your health care provider. This is important. This information is not intended to replace advice given to you by your health care provider. Make sure you discuss any questions you have with your health care provider. Document Revised: 02/08/2018 Document Reviewed: 02/08/2018 [employer] Patient Education  2022 [employer]  

## 2021-04-22 ENCOUNTER — Ambulatory Visit (INDEPENDENT_AMBULATORY_CARE_PROVIDER_SITE_OTHER): Payer: Medicare Other | Admitting: Family Medicine

## 2021-04-22 ENCOUNTER — Ambulatory Visit
Admission: RE | Admit: 2021-04-22 | Discharge: 2021-04-22 | Disposition: A | Discharge status: Home / Self Care | Payer: Medicare Other | Source: Ambulatory Visit | Attending: Family Medicine | Admitting: Family Medicine

## 2021-04-22 ENCOUNTER — Encounter: Payer: Self-pay | Admitting: Family Medicine

## 2021-04-22 ENCOUNTER — Ambulatory Visit
Admission: RE | Admit: 2021-04-22 | Discharge: 2021-04-22 | Disposition: A | Discharge status: Home / Self Care | Payer: Medicare Other | Attending: Family Medicine | Admitting: Family Medicine

## 2021-04-22 ENCOUNTER — Other Ambulatory Visit: Payer: Self-pay

## 2021-04-22 VITALS — BP 128/80 | HR 80 | Ht 69.0 in | Wt 126.0 lb

## 2021-04-22 DIAGNOSIS — M79671 Pain in right foot: Secondary | ICD-10-CM | POA: Insufficient documentation

## 2021-04-22 DIAGNOSIS — G8929 Other chronic pain: Secondary | ICD-10-CM | POA: Diagnosis not present

## 2021-04-22 DIAGNOSIS — K219 Gastro-esophageal reflux disease without esophagitis: Secondary | ICD-10-CM

## 2021-04-22 DIAGNOSIS — I10 Essential (primary) hypertension: Secondary | ICD-10-CM | POA: Diagnosis not present

## 2021-04-22 DIAGNOSIS — J301 Allergic rhinitis due to pollen: Secondary | ICD-10-CM

## 2021-04-22 DIAGNOSIS — E032 Hypothyroidism due to medicaments and other exogenous substances: Secondary | ICD-10-CM

## 2021-04-22 MED ORDER — LOSARTAN POTASSIUM 50 MG PO TABS: 50.0000 mg | ORAL_TABLET | Freq: Every day | ORAL | 1 refills | Status: DC

## 2021-04-22 MED ORDER — ETODOLAC 500 MG PO TABS: 500.0000 mg | ORAL_TABLET | Freq: Two times a day (BID) | ORAL | 1 refills | Status: DC

## 2021-04-22 MED ORDER — MONTELUKAST SODIUM 10 MG PO TABS: 10.0000 mg | ORAL_TABLET | Freq: Every day | ORAL | 1 refills | Status: DC

## 2021-04-22 MED ORDER — LEVOTHYROXINE SODIUM 25 MCG PO TABS
25.0000 ug | ORAL_TABLET | Freq: Every day | ORAL | 1 refills | Status: DC
Start: 2021-04-22 — End: 2021-06-21

## 2021-04-22 MED ORDER — PANTOPRAZOLE SODIUM 40 MG PO TBEC: 40.0000 mg | DELAYED_RELEASE_TABLET | Freq: Every day | ORAL | 1 refills | Status: DC

## 2021-04-22 MED ORDER — AMLODIPINE BESYLATE 5 MG PO TABS: 5.0000 mg | ORAL_TABLET | Freq: Every day | ORAL | 1 refills | Status: DC

## 2021-04-22 NOTE — Progress Notes (Signed)
Date:  04/22/2021   Name:  Victoria Rush   DOB:  [birth year]   MRN:  [id number]   Chief Complaint: Gastroesophageal Reflux, Allergic Rhinitis , Hypertension, Hypothyroidism, and Arthritis (Change meloxicam to etodolac)  Gastroesophageal Reflux She reports no abdominal pain, no belching, no chest pain, no choking, no coughing, no dysphagia, no early satiety, no globus sensation, no heartburn, no hoarse voice, no nausea, no sore throat, no stridor or no wheezing. This is a new problem. The current episode started more than 1 month ago. The problem has been unchanged. Nothing (weight baring) aggravates the symptoms. She has tried a PPI for the symptoms.  Hypertension This is a chronic problem. The current episode started more than 1 year ago. The problem has been gradually improving since onset. The problem is controlled. Pertinent negatives include no chest pain, headaches, neck pain, orthopnea, palpitations, peripheral edema, PND, shortness of breath or sweats. Risk factors for coronary artery disease include dyslipidemia. Past treatments include angiotensin blockers and calcium channel blockers. There is no history of angina, kidney disease, CAD/MI, CVA, heart failure, left ventricular hypertrophy, PVD or retinopathy. There is no history of chronic renal disease, a hypertension causing med or renovascular disease.  Arthritis Presents for follow-up visit. She complains of pain. She reports no stiffness, joint swelling or joint warmth. The symptoms have been stable. Affected locations include the right toes and right foot. Pertinent negatives include no dysuria.   Lab Results  Component Value Date   CREATININE 0.86 01/05/2021   BUN 9 01/05/2021   NA 132 (L) 01/05/2021   K 4.5 01/05/2021   CL 94 (L) 01/05/2021   CO2 25 01/05/2021   Lab Results  Component Value Date   CHOL 215 (H) 01/05/2021   HDL 61 01/05/2021   LDLCALC 134 (H) 01/05/2021   TRIG 112 01/05/2021   CHOLHDL 3.2 08/28/2018    Lab Results  Component Value Date   TSH 1.900 01/05/2021   No results found for: HGBA1C No results found for: WBC, HGB, HCT, MCV, PLT No results found for: ALT, AST, GGT, ALKPHOS, BILITOT   Review of Systems  HENT:  Negative for congestion, hoarse voice and sore throat.   Respiratory:  Negative for cough, choking, shortness of breath and wheezing.   Cardiovascular:  Negative for chest pain, palpitations, orthopnea and PND.  Gastrointestinal:  Negative for abdominal pain, dysphagia, heartburn and nausea.  Genitourinary:  Negative for dysuria and hematuria.  Musculoskeletal:  Positive for arthralgias and arthritis. Negative for joint swelling, neck pain and stiffness.  Neurological:  Negative for headaches.   Patient Active Problem List   Diagnosis Date Noted   Quadriceps strain, right, initial encounter 08/23/2017   Contact dermatitis 02/12/2016   Esophageal reflux 02/12/2016   Essential hypertension 02/12/2016   Chronic seasonal allergic rhinitis due to pollen 12/04/2015   Hypothyroidism 01/30/2015   Hypercholesterolemia 01/30/2015    Allergies  Allergen Reactions   Sulfa Antibiotics     Past Surgical History:  Procedure Laterality Date   APPENDECTOMY     COLONOSCOPY  2008   Dr Sonny Masters- repeat in 10 years   SKIN CANCER EXCISION     foot    Social History   Tobacco Use   Smoking status: Never   Smokeless tobacco: Never  Vaping Use   Vaping Use: Never used  Substance Use Topics   Alcohol use: No    Alcohol/week: 0.0 standard drinks   Drug use: No     Medication list  has been reviewed and updated.  Current Meds  Medication Sig   amLODipine (NORVASC) 5 MG tablet Take 1 tablet (5 mg total) by mouth daily.   cetirizine (ZYRTEC) 10 MG tablet Take 1 tablet (10 mg total) by mouth daily.   levothyroxine (SYNTHROID) 25 MCG tablet Take 1 tablet (25 mcg total) by mouth daily.   losartan (COZAAR) 50 MG tablet Take 1 tablet (50 mg total) by mouth daily.    montelukast (SINGULAIR) 10 MG tablet Take 1 tablet (10 mg total) by mouth at bedtime.   pantoprazole (PROTONIX) 40 MG tablet Take 1 tablet (40 mg total) by mouth daily.    PHQ 2/9 Scores 03/23/2021 10/14/2020 09/18/2020 06/12/2020  PHQ - 2 Score 0 0 0 0  PHQ- 9 Score 0 - 0 0    GAD 7 : Generalized Anxiety Score 03/23/2021 09/18/2020 06/12/2020 01/02/2020  Nervous, Anxious, on Edge 0 0 0 0  Control/stop worrying 0 0 0 0  Worry too much - different things 0 0 0 0  Trouble relaxing 0 0 0 0  Restless 0 0 0 0  Easily annoyed or irritable 0 0 0 0  Afraid - awful might happen 0 0 0 0  Total GAD 7 Score 0 0 0 0    BP Readings from Last 3 Encounters:  04/22/21 128/80  03/23/21 120/70  01/05/21 138/80    Physical Exam Vitals and nursing note reviewed.  Constitutional:      Appearance: She is well-developed.  HENT:     Head: Normocephalic.     Right Ear: Tympanic membrane, ear canal and external ear normal.     Left Ear: Tympanic membrane, ear canal and external ear normal.  Eyes:     General: Lids are everted, no foreign bodies appreciated. No scleral icterus.       Left eye: No foreign body or hordeolum.     Conjunctiva/sclera: Conjunctivae normal.     Right eye: Right conjunctiva is not injected.     Left eye: Left conjunctiva is not injected.     Pupils: Pupils are equal, round, and reactive to light.  Neck:     Thyroid: No thyromegaly.     Vascular: No JVD.     Trachea: No tracheal deviation.  Cardiovascular:     Rate and Rhythm: Normal rate and regular rhythm.     Heart sounds: Normal heart sounds, S1 normal and S2 normal. No murmur heard. No systolic murmur is present.  No diastolic murmur is present.    No friction rub. No gallop. No S3 or S4 sounds.  Pulmonary:     Effort: Pulmonary effort is normal. No respiratory distress.     Breath sounds: Normal breath sounds. No decreased breath sounds, wheezing, rhonchi or rales.  Abdominal:     General: Bowel sounds are normal.      Palpations: Abdomen is soft. There is no hepatomegaly, splenomegaly or mass.     Tenderness: There is no abdominal tenderness. There is no guarding or rebound.  Musculoskeletal:        General: Normal range of motion.     Cervical back: Normal range of motion and neck supple.     Right foot: Tenderness present.     Comments: Tender first MT and along first metatarsal  Lymphadenopathy:     Cervical: No cervical adenopathy.  Skin:    General: Skin is warm.     Findings: No rash.  Neurological:     Mental Status: She is  alert and oriented to person, place, and time.     Cranial Nerves: No cranial nerve deficit.     Deep Tendon Reflexes: Reflexes normal.  Psychiatric:        Mood and Affect: Mood is not anxious or depressed.    Wt Readings from Last 3 Encounters:  04/22/21 126 lb (57.2 kg)  03/23/21 128 lb (58.1 kg)  01/05/21 127 lb (57.6 kg)    BP 128/80   Pulse 80   Ht 5\' 9"  (1.753 m)   Wt 126 lb (57.2 kg)   BMI 18.61 kg/m   Assessment and Plan:  1. Foot pain, right New onset.  Status post going to the beach and walking on the sand developed pain in the right first MTP joint along the right first metatarsal.  There is tenderness along this area.  We will switch from meloxicam to etodolac which is the patient's preference 500 mg twice a day and we will obtain a x-ray of the foot to rule out stress fracture. - etodolac (LODINE) 500 MG tablet; Take 1 tablet (500 mg total) by mouth 2 (two) times daily.  Dispense: 30 tablet; Refill: 1 - DG Foot Complete Right; Future  2. Essential hypertension Chronic.  Controlled.  Stable.  Continue current medications of losartan 50 mg once a day and amlodipine 5 mg once a day.  Blood pressure reading today is excellent at 128/80. - amLODipine (NORVASC) 5 MG tablet; Take 1 tablet (5 mg total) by mouth daily.  Dispense: 90 tablet; Refill: 1 - losartan (COZAAR) 50 MG tablet; Take 1 tablet (50 mg total) by mouth daily.  Dispense: 90 tablet;  Refill: 1  3. 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  Controlled.  Stable.  Continue levothyroxine 25 mcg daily.  We will recheck patient in 6 months. - levothyroxine (SYNTHROID) 25 MCG tablet; Take 1 tablet (25 mcg total) by mouth daily.  Dispense: 90 tablet; Refill: 1  4. Chronic seasonal allergic rhinitis due to pollen Chronic.  Episodic.  Stable.  Refill Singulair 10 mg once a day. - montelukast (SINGULAIR) 10 MG tablet; Take 1 tablet (10 mg total) by mouth at bedtime.  Dispense: 90 tablet; Refill: 1  5. Gastroesophageal reflux disease, unspecified whether esophagitis present Chronic.  Controlled.  Stable.  Continue pantoprazole 40 mg once a day. - pantoprazole (PROTONIX) 40 MG tablet; Take 1 tablet (40 mg total) by mouth daily.  Dispense: 90 tablet; Refill: 1

## 2021-04-26 ENCOUNTER — Telehealth: Payer: Self-pay

## 2021-04-26 NOTE — Telephone Encounter (Signed)
Copied from [place] [id number]. Topic: General - Inquiry >> Apr 26, 2021  1:34 PM Greggory Keen D wrote: Reason for CRM: Pt is calling for the results of her foot xray from Thursday.  CB#  [id number]

## 2021-06-02 ENCOUNTER — Other Ambulatory Visit: Payer: Self-pay

## 2021-06-02 ENCOUNTER — Encounter: Payer: Self-pay | Admitting: Internal Medicine

## 2021-06-02 ENCOUNTER — Ambulatory Visit (INDEPENDENT_AMBULATORY_CARE_PROVIDER_SITE_OTHER): Payer: Medicare Other | Admitting: Internal Medicine

## 2021-06-02 VITALS — BP 112/64 | HR 74 | Temp 98.0°F | Ht 69.0 in | Wt 129.0 lb

## 2021-06-02 DIAGNOSIS — J0100 Acute maxillary sinusitis, unspecified: Secondary | ICD-10-CM

## 2021-06-02 MED ORDER — PROMETHAZINE-DM 6.25-15 MG/5ML PO SYRP: 5.0000 mL | ORAL_SOLUTION | Freq: Four times a day (QID) | ORAL | 0 refills | Status: DC | PRN

## 2021-06-02 MED ORDER — AMOXICILLIN-POT CLAVULANATE 875-125 MG PO TABS: 1.0000 | ORAL_TABLET | Freq: Two times a day (BID) | ORAL | 0 refills | Status: AC

## 2021-06-02 NOTE — Progress Notes (Signed)
Date:  06/02/2021   Name:  Victoria Rush   DOB:  1946-06-08   MRN:  [id number]   Chief Complaint: Sore Throat  Sore Throat  This is a new problem. Episode onset: 3-4 days ago. The problem has been unchanged. Neither (Both) side of throat is experiencing more pain than the other. There has been no fever. The pain is at a severity of 5/10. The pain is moderate. Associated symptoms include congestion, coughing and headaches. Pertinent negatives include no ear discharge, ear pain or shortness of breath. She has tried NSAIDs (OTC cough syrup) for the symptoms. The treatment provided mild relief.   Lab Results  Component Value Date   NA 132 (L) 01/05/2021   K 4.5 01/05/2021   CO2 25 01/05/2021   GLUCOSE 113 (H) 01/05/2021   BUN 9 01/05/2021   CREATININE 0.86 01/05/2021   CALCIUM 9.5 01/05/2021   EGFR 70 01/05/2021   GFRNONAA 86 06/12/2020   Lab Results  Component Value Date   CHOL 215 (H) 01/05/2021   HDL 61 01/05/2021   LDLCALC 134 (H) 01/05/2021   TRIG 112 01/05/2021   CHOLHDL 3.2 08/28/2018   Lab Results  Component Value Date   TSH 1.900 01/05/2021   No results found for: HGBA1C No results found for: WBC, HGB, HCT, MCV, PLT No results found for: ALT, AST, GGT, ALKPHOS, BILITOT No results found for: 25OHVITD2, 25OHVITD3, VD25OH   Review of Systems  Constitutional:  Negative for chills, fatigue and fever.  HENT:  Positive for congestion, postnasal drip, sinus pressure and sore throat. Negative for ear discharge and ear pain.   Respiratory:  Positive for cough. Negative for chest tightness, shortness of breath and wheezing.   Cardiovascular:  Negative for chest pain.  Allergic/Immunologic: Positive for environmental allergies.  Neurological:  Positive for headaches.   Patient Active Problem List   Diagnosis Date Noted   Esophageal reflux 02/12/2016   Essential hypertension 02/12/2016   Chronic seasonal allergic rhinitis due to pollen 12/04/2015   Hypothyroidism  01/30/2015   Hypercholesterolemia 01/30/2015    Allergies  Allergen Reactions   Sulfa Antibiotics     Past Surgical History:  Procedure Laterality Date   APPENDECTOMY     COLONOSCOPY  2008   Dr Sonny Masters- repeat in 10 years   SKIN CANCER EXCISION     foot    Social History   Tobacco Use   Smoking status: Never   Smokeless tobacco: Never  Vaping Use   Vaping Use: Never used  Substance Use Topics   Alcohol use: No    Alcohol/week: 0.0 standard drinks   Drug use: No     Medication list has been reviewed and updated.  Current Meds  Medication Sig   amLODipine (NORVASC) 5 MG tablet Take 1 tablet (5 mg total) by mouth daily.   amoxicillin-clavulanate (AUGMENTIN) 875-125 MG tablet Take 1 tablet by mouth 2 (two) times daily for 10 days.   cetirizine (ZYRTEC) 10 MG tablet Take 1 tablet (10 mg total) by mouth daily.   etodolac (LODINE) 500 MG tablet Take 1 tablet (500 mg total) by mouth 2 (two) times daily. (Patient taking differently: Take 500 mg by mouth as needed.)   levothyroxine (SYNTHROID) 25 MCG tablet Take 1 tablet (25 mcg total) by mouth daily.   losartan (COZAAR) 50 MG tablet Take 1 tablet (50 mg total) by mouth daily.   montelukast (SINGULAIR) 10 MG tablet Take 1 tablet (10 mg total) by mouth at bedtime. (  Patient taking differently: Take 10 mg by mouth as needed.)   pantoprazole (PROTONIX) 40 MG tablet Take 1 tablet (40 mg total) by mouth daily.   promethazine-dextromethorphan (PROMETHAZINE-DM) 6.25-15 MG/5ML syrup Take 5 mLs by mouth 4 (four) times daily as needed for cough.   [DISCONTINUED] polyethylene glycol-electrolytes (NULYTELY) 420 g solution Use as directed for colon cleanse.    PHQ 2/9 Scores 06/02/2021 03/23/2021 10/14/2020 09/18/2020  PHQ - 2 Score 0 0 0 0  PHQ- 9 Score 0 0 - 0    GAD 7 : Generalized Anxiety Score 06/02/2021 03/23/2021 09/18/2020 06/12/2020  Nervous, Anxious, on Edge 0 0 0 0  Control/stop worrying 0 0 0 0  Worry too much - different things  0 0 0 0  Trouble relaxing 0 0 0 0  Restless 0 0 0 0  Easily annoyed or irritable 0 0 0 0  Afraid - awful might happen 0 0 0 0  Total GAD 7 Score 0 0 0 0  Anxiety Difficulty Not difficult at all - - -    BP Readings from Last 3 Encounters:  06/02/21 112/64  04/22/21 128/80  03/23/21 120/70    Physical Exam Constitutional:      Appearance: She is well-developed.  HENT:     Right Ear: Decreased hearing noted. There is impacted cerumen. Tympanic membrane is not erythematous or retracted.     Left Ear: Ear canal and external ear normal. Decreased hearing noted. Tympanic membrane is not erythematous or retracted.     Nose:     Right Sinus: Maxillary sinus tenderness present. No frontal sinus tenderness.     Left Sinus: Maxillary sinus tenderness present. No frontal sinus tenderness.     Mouth/Throat:     Mouth: No oral lesions.     Pharynx: Uvula midline. Posterior oropharyngeal erythema present. No oropharyngeal exudate.     Tonsils: No tonsillar exudate or tonsillar abscesses.  Eyes:     Conjunctiva/sclera: Conjunctivae normal.  Cardiovascular:     Rate and Rhythm: Normal rate and regular rhythm.     Heart sounds: Normal heart sounds.  Pulmonary:     Breath sounds: Normal breath sounds. No wheezing or rales.  Musculoskeletal:     Cervical back: Normal range of motion.  Lymphadenopathy:     Cervical: No cervical adenopathy.  Neurological:     Mental Status: She is alert and oriented to person, place, and time.    Wt Readings from Last 3 Encounters:  06/02/21 129 lb (58.5 kg)  04/22/21 126 lb (57.2 kg)  03/23/21 128 lb (58.1 kg)    BP 112/64   Pulse 74   Temp 98 F (36.7 C) (Oral)   Ht 5' 9" (1.753 m)   Wt 129 lb (58.5 kg)   SpO2 98%   BMI 19.05 kg/m   Assessment and Plan: 1. Acute maxillary sinusitis, recurrence not specified Following recent likely viral URI Continue fluids, Tylenol, Zyrtec - amoxicillin-clavulanate (AUGMENTIN) 875-125 MG tablet; Take 1  tablet by mouth 2 (two) times daily for 10 days.  Dispense: 20 tablet; Refill: 0 - promethazine-dextromethorphan (PROMETHAZINE-DM) 6.25-15 MG/5ML syrup; Take 5 mLs by mouth 4 (four) times daily as needed for cough.  Dispense: 180 mL; Refill: 0   Partially dictated using Dragon software. Any errors are unintentional.  Laura Berglund, MD [facility]  06/02/2021       

## 2021-06-14 ENCOUNTER — Other Ambulatory Visit: Payer: Self-pay | Admitting: Family Medicine

## 2021-06-14 DIAGNOSIS — M79671 Pain in right foot: Secondary | ICD-10-CM

## 2021-06-14 NOTE — Telephone Encounter (Signed)
Requested medications are due for refill today.  yes  Requested medications are on the active medications list.  yes  Last refill. 04/22/2021  Future visit scheduled.   yes  Notes to clinic.  Failed protocol d/t missing labs. Also Sig is different than pt taking.    Requested Prescriptions  Pending Prescriptions Disp Refills   etodolac (LODINE) 500 MG tablet [Pharmacy Med Name: Etodolac 500 MG Oral Tablet] 30 tablet 0    Sig: Take 1 tablet by mouth twice daily     Analgesics:  NSAIDS Failed - 06/14/2021  4:22 PM      Failed - HGB in normal range and within 360 days    No results found for: HGB, HGBKUC, HGBPOCKUC, HGBOTHER, TOTHGB, HGBPLASMA        Passed - Cr in normal range and within 360 days    Creatinine, Ser  Date Value Ref Range Status  01/05/2021 0.86 0.57 - 1.00 mg/dL Final          Passed - Patient is not pregnant      Passed - Valid encounter within last 12 months    Recent Outpatient Visits           1 week ago Acute maxillary sinusitis, recurrence not specified   [facility] Glean Hess, MD   1 month ago Foot pain, right   [facility] Juline Patch, MD   2 months ago Tenosynovitis   [facility] Juline Patch, MD   5 months ago Essential hypertension   [place], [place]   6 months ago Acute maxillary sinusitis, recurrence not specified   [facility] Juline Patch, MD       Future Appointments             In 3 weeks Juline Patch, MD [facility], [place]   In 3 months Juline Patch, MD [facility], [facility]

## 2021-06-21 ENCOUNTER — Other Ambulatory Visit: Payer: Self-pay

## 2021-06-21 ENCOUNTER — Encounter: Payer: Self-pay | Admitting: Family Medicine

## 2021-06-21 ENCOUNTER — Ambulatory Visit (INDEPENDENT_AMBULATORY_CARE_PROVIDER_SITE_OTHER): Payer: Medicare Other | Admitting: Family Medicine

## 2021-06-21 VITALS — BP 133/70 | HR 72 | Ht 69.0 in | Wt 127.0 lb

## 2021-06-21 DIAGNOSIS — J301 Allergic rhinitis due to pollen: Secondary | ICD-10-CM

## 2021-06-21 DIAGNOSIS — E032 Hypothyroidism due to medicaments and other exogenous substances: Secondary | ICD-10-CM

## 2021-06-21 DIAGNOSIS — K219 Gastro-esophageal reflux disease without esophagitis: Secondary | ICD-10-CM

## 2021-06-21 DIAGNOSIS — I10 Essential (primary) hypertension: Secondary | ICD-10-CM

## 2021-06-21 MED ORDER — LOSARTAN POTASSIUM 50 MG PO TABS: 50.0000 mg | ORAL_TABLET | Freq: Every day | ORAL | 1 refills | Status: DC

## 2021-06-21 MED ORDER — AMLODIPINE BESYLATE 5 MG PO TABS: 5.0000 mg | ORAL_TABLET | Freq: Every day | ORAL | 1 refills | Status: DC

## 2021-06-21 MED ORDER — LEVOTHYROXINE SODIUM 25 MCG PO TABS: 25.0000 ug | ORAL_TABLET | Freq: Every day | ORAL | 1 refills | Status: DC

## 2021-06-21 MED ORDER — MONTELUKAST SODIUM 10 MG PO TABS: 10.0000 mg | ORAL_TABLET | Freq: Every day | ORAL | 1 refills | Status: DC

## 2021-06-21 MED ORDER — PANTOPRAZOLE SODIUM 40 MG PO TBEC
40.0000 mg | DELAYED_RELEASE_TABLET | Freq: Every day | ORAL | 1 refills | Status: DC
Start: 2021-06-21 — End: 2021-12-23

## 2021-06-21 NOTE — Progress Notes (Signed)
Date:  06/21/2021   Name:  Victoria Rush   DOB:  Jul 15, 1945   MRN:  [id number]   Chief Complaint: Gastroesophageal Reflux, Hypothyroidism, Hypertension, and Allergic Rhinitis   Gastroesophageal Reflux She complains of coughing. She reports no abdominal pain, no chest pain, no choking, no dysphagia, no heartburn, no nausea, no sore throat or no stridor. This is a chronic problem. The current episode started in the past 7 days. The problem occurs rarely. The problem has been gradually improving. Nothing aggravates the symptoms. Pertinent negatives include no fatigue or weight loss. She has tried a PPI for the symptoms.  Hypertension This is a chronic problem. The current episode started more than 1 year ago. The problem has been gradually improving since onset. The problem is controlled. Pertinent negatives include no blurred vision, chest pain, headaches, palpitations, PND or shortness of breath. Past treatments include angiotensin blockers and calcium channel blockers. There are no compliance problems.  There is no history of angina, kidney disease, CAD/MI, CVA, heart failure, left ventricular hypertrophy, PVD or retinopathy. Identifiable causes of hypertension include a thyroid problem. There is no history of chronic renal disease, a hypertension causing med or renovascular disease.  Thyroid Problem Presents for follow-up visit. Patient reports no cold intolerance, depressed mood, dry skin, fatigue, hair loss, leg swelling, nail problem, palpitations, weight gain or weight loss. The symptoms have been stable. There is no history of heart failure.  URI  This is a recurrent (allergies) problem. The problem has been waxing and waning. Associated symptoms include coughing. Pertinent negatives include no abdominal pain, chest pain, headaches, nausea, rhinorrhea, sinus pain or sore throat. The treatment provided mild relief.   Lab Results  Component Value Date   NA 132 (L) 01/05/2021   K 4.5  01/05/2021   CO2 25 01/05/2021   GLUCOSE 113 (H) 01/05/2021   BUN 9 01/05/2021   CREATININE 0.86 01/05/2021   CALCIUM 9.5 01/05/2021   EGFR 70 01/05/2021   GFRNONAA 86 06/12/2020   Lab Results  Component Value Date   CHOL 215 (H) 01/05/2021   HDL 61 01/05/2021   LDLCALC 134 (H) 01/05/2021   TRIG 112 01/05/2021   CHOLHDL 3.2 08/28/2018   Lab Results  Component Value Date   TSH 1.900 01/05/2021   No results found for: HGBA1C No results found for: WBC, HGB, HCT, MCV, PLT No results found for: ALT, AST, GGT, ALKPHOS, BILITOT No results found for: 25OHVITD2, 25OHVITD3, VD25OH   Review of Systems  Constitutional:  Negative for fatigue, weight gain and weight loss.  HENT:  Negative for rhinorrhea, sinus pain and sore throat.   Eyes:  Negative for blurred vision.  Respiratory:  Positive for cough. Negative for choking and shortness of breath.   Cardiovascular:  Negative for chest pain, palpitations and PND.  Gastrointestinal:  Negative for abdominal pain, dysphagia, heartburn and nausea.  Endocrine: Negative for cold intolerance.  Neurological:  Negative for headaches.   Patient Active Problem List   Diagnosis Date Noted   Esophageal reflux 02/12/2016   Essential hypertension 02/12/2016   Chronic seasonal allergic rhinitis due to pollen 12/04/2015   Hypothyroidism 01/30/2015   Hypercholesterolemia 01/30/2015    Allergies  Allergen Reactions   Sulfa Antibiotics     Past Surgical History:  Procedure Laterality Date   APPENDECTOMY     COLONOSCOPY  2008   Dr Sonny Masters- repeat in 7 years   SKIN CANCER EXCISION     foot    Social History  Tobacco Use   Smoking status: Never   Smokeless tobacco: Never  Vaping Use   Vaping Use: Never used  Substance Use Topics   Alcohol use: No    Alcohol/week: 0.0 standard drinks   Drug use: No     Medication list has been reviewed and updated.  Current Meds  Medication Sig   amLODipine (NORVASC) 5 MG tablet Take 1 tablet  (5 mg total) by mouth daily.   cetirizine (ZYRTEC) 10 MG tablet Take 1 tablet (10 mg total) by mouth daily.   etodolac (LODINE) 500 MG tablet Take 1 tablet by mouth twice daily   levothyroxine (SYNTHROID) 25 MCG tablet Take 1 tablet (25 mcg total) by mouth daily.   losartan (COZAAR) 50 MG tablet Take 1 tablet (50 mg total) by mouth daily.   montelukast (SINGULAIR) 10 MG tablet Take 1 tablet (10 mg total) by mouth at bedtime.   pantoprazole (PROTONIX) 40 MG tablet Take 1 tablet (40 mg total) by mouth daily.    PHQ 2/9 Scores 06/21/2021 06/02/2021 03/23/2021 10/14/2020  PHQ - 2 Score 0 0 0 0  PHQ- 9 Score 0 0 0 -    GAD 7 : Generalized Anxiety Score 06/21/2021 06/02/2021 03/23/2021 09/18/2020  Nervous, Anxious, on Edge 0 0 0 0  Control/stop worrying 0 0 0 0  Worry too much - different things 0 0 0 0  Trouble relaxing 0 0 0 0  Restless 0 0 0 0  Easily annoyed or irritable 0 0 0 0  Afraid - awful might happen 0 0 0 0  Total GAD 7 Score 0 0 0 0  Anxiety Difficulty - Not difficult at all - -    BP Readings from Last 3 Encounters:  06/02/21 112/64  04/22/21 128/80  03/23/21 120/70    Physical Exam Vitals and nursing note reviewed.  Constitutional:      General: She is not in acute distress.    Appearance: She is not diaphoretic.  HENT:     Head: Normocephalic and atraumatic.     Right Ear: Tympanic membrane, ear canal and external ear normal.     Left Ear: Tympanic membrane, ear canal and external ear normal.     Nose: Nose normal. No congestion or rhinorrhea.  Eyes:     General:        Right eye: No discharge.        Left eye: No discharge.     Conjunctiva/sclera: Conjunctivae normal.     Pupils: Pupils are equal, round, and reactive to light.  Neck:     Thyroid: No thyromegaly.     Vascular: No JVD.  Cardiovascular:     Rate and Rhythm: Normal rate and regular rhythm.     Heart sounds: Normal heart sounds. No murmur heard.   No friction rub. No gallop.  Pulmonary:      Effort: Pulmonary effort is normal.     Breath sounds: Normal breath sounds. No wheezing or rhonchi.  Abdominal:     General: Bowel sounds are normal.     Palpations: Abdomen is soft. There is no mass.     Tenderness: There is no abdominal tenderness. There is no guarding.  Musculoskeletal:        General: Normal range of motion.     Cervical back: Normal range of motion and neck supple.  Lymphadenopathy:     Cervical: No cervical adenopathy.  Skin:    General: Skin is warm and dry.  Neurological:  Mental Status: She is alert.     Deep Tendon Reflexes: Reflexes are normal and symmetric.    Wt Readings from Last 3 Encounters:  06/21/21 127 lb (57.6 kg)  06/02/21 129 lb (58.5 kg)  04/22/21 126 lb (57.2 kg)    Ht _0  (1.753 m)    Wt 127 lb (57.6 kg)    BMI 18.75 kg/m   Assessment and Plan:  1. Essential hypertension Chronic.  Controlled.  Stable.  Blood pressure today is 133/70.  Continue amlodipine 5 mg once a day and losartan 50 mg once a day.  Review of renal panel is acceptable and will repeat in 6 months - amLODipine (NORVASC) 5 MG tablet; Take 1 tablet (5 mg total) by mouth daily.  Dispense: 90 tablet; Refill: 1 - losartan (COZAAR) 50 MG tablet; Take 1 tablet (50 mg total) by mouth daily.  Dispense: 90 tablet; Refill: 1  2. 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  Controlled.  Stable.  Subjectively and objectively.  Thyroid exam is normal.  Continue levothyroxine 25 mcg daily. - levothyroxine (SYNTHROID) 25 MCG tablet; Take 1 tablet (25 mcg total) by mouth daily.  Dispense: 90 tablet; Refill: 1  3. Chronic seasonal allergic rhinitis due to pollen Chronic.  Controlled.  Stable.  Continue Singulair 10 mg 1 nightly as needed as needed - montelukast (SINGULAIR) 10 MG tablet; Take 1 tablet (10 mg total) by mouth at bedtime.  Dispense: 90 tablet; Refill: 1  4. Gastroesophageal reflux disease, unspecified whether esophagitis present Chronic.   Controlled.  Stable.  Course of disease is improving on pantoprazole 40 mg once daily and we will continue at current dosing. - pantoprazole (PROTONIX) 40 MG tablet; Take 1 tablet (40 mg total) by mouth daily.  Dispense: 90 tablet; Refill: 1

## 2021-07-08 ENCOUNTER — Ambulatory Visit: Payer: Self-pay | Admitting: Family Medicine

## 2021-07-19 ENCOUNTER — Ambulatory Visit (INDEPENDENT_AMBULATORY_CARE_PROVIDER_SITE_OTHER): Payer: Medicare Other | Admitting: Family Medicine

## 2021-07-19 ENCOUNTER — Encounter: Payer: Self-pay | Admitting: Family Medicine

## 2021-07-19 ENCOUNTER — Other Ambulatory Visit: Payer: Self-pay

## 2021-07-19 VITALS — BP 120/70 | HR 100 | Ht 69.0 in | Wt 127.0 lb

## 2021-07-19 DIAGNOSIS — J0100 Acute maxillary sinusitis, unspecified: Secondary | ICD-10-CM

## 2021-07-19 MED ORDER — AMOXICILLIN-POT CLAVULANATE 875-125 MG PO TABS: 1.0000 | ORAL_TABLET | Freq: Two times a day (BID) | ORAL | 0 refills | Status: DC

## 2021-07-19 NOTE — Progress Notes (Signed)
Date:  07/19/2021   Name:  Victoria Rush   DOB:  08/11/1945   MRN:  [id number]   Chief Complaint: Sinusitis (Head stuffy, nose is running, facial pressure, cough- yellow phlegm. Using otc cough syrup- helps)  Sinusitis This is a new problem. The current episode started in the past 7 days (saturday). There has been no fever. The pain is mild. Associated symptoms include congestion, coughing, headaches, a hoarse voice and sinus pressure. Pertinent negatives include no chills, ear pain, neck pain, shortness of breath or sore throat. Past treatments include acetaminophen and oral decongestants (robitussin). The treatment provided no relief.   Lab Results  Component Value Date   NA 132 (L) 01/05/2021   K 4.5 01/05/2021   CO2 25 01/05/2021   GLUCOSE 113 (H) 01/05/2021   BUN 9 01/05/2021   CREATININE 0.86 01/05/2021   CALCIUM 9.5 01/05/2021   EGFR 70 01/05/2021   GFRNONAA 86 06/12/2020   Lab Results  Component Value Date   CHOL 215 (H) 01/05/2021   HDL 61 01/05/2021   LDLCALC 134 (H) 01/05/2021   TRIG 112 01/05/2021   CHOLHDL 3.2 08/28/2018   Lab Results  Component Value Date   TSH 1.900 01/05/2021   No results found for: HGBA1C No results found for: WBC, HGB, HCT, MCV, PLT No results found for: ALT, AST, GGT, ALKPHOS, BILITOT No results found for: 25OHVITD2, 25OHVITD3, VD25OH   Review of Systems  Constitutional:  Negative for chills and fever.  HENT:  Positive for congestion, hoarse voice and sinus pressure. Negative for drooling, ear discharge, ear pain and sore throat.   Respiratory:  Positive for cough. Negative for shortness of breath and wheezing.   Cardiovascular:  Negative for chest pain, palpitations and leg swelling.  Gastrointestinal:  Negative for abdominal pain, blood in stool, constipation, diarrhea and nausea.  Endocrine: Negative for polydipsia.  Genitourinary:  Negative for dysuria, frequency, hematuria and urgency.  Musculoskeletal:  Negative for back pain,  myalgias and neck pain.  Skin:  Negative for rash.  Allergic/Immunologic: Negative for environmental allergies.  Neurological:  Positive for headaches. Negative for dizziness.  Hematological:  Does not bruise/bleed easily.  Psychiatric/Behavioral:  Negative for suicidal ideas. The patient is not nervous/anxious.    Patient Active Problem List   Diagnosis Date Noted   Esophageal reflux 02/12/2016   Essential hypertension 02/12/2016   Chronic seasonal allergic rhinitis due to pollen 12/04/2015   Hypothyroidism 01/30/2015   Hypercholesterolemia 01/30/2015    Allergies  Allergen Reactions   Sulfa Antibiotics     Past Surgical History:  Procedure Laterality Date   APPENDECTOMY     COLONOSCOPY  2008   Dr Sonny Masters- repeat in 10 years   SKIN CANCER EXCISION     foot    Social History   Tobacco Use   Smoking status: Never   Smokeless tobacco: Never  Vaping Use   Vaping Use: Never used  Substance Use Topics   Alcohol use: No    Alcohol/week: 0.0 standard drinks   Drug use: No     Medication list has been reviewed and updated.  Current Meds  Medication Sig   amLODipine (NORVASC) 5 MG tablet Take 1 tablet (5 mg total) by mouth daily.   cetirizine (ZYRTEC) 10 MG tablet Take 1 tablet (10 mg total) by mouth daily.   etodolac (LODINE) 500 MG tablet Take 1 tablet by mouth twice daily   levothyroxine (SYNTHROID) 25 MCG tablet Take 1 tablet (25 mcg total) by  mouth daily.   losartan (COZAAR) 50 MG tablet Take 1 tablet (50 mg total) by mouth daily.   montelukast (SINGULAIR) 10 MG tablet Take 1 tablet (10 mg total) by mouth at bedtime.   pantoprazole (PROTONIX) 40 MG tablet Take 1 tablet (40 mg total) by mouth daily.    PHQ 2/9 Scores 07/19/2021 06/21/2021 06/02/2021 03/23/2021  PHQ - 2 Score 0 0 0 0  PHQ- 9 Score 0 0 0 0    GAD 7 : Generalized Anxiety Score 06/21/2021 06/02/2021 03/23/2021 09/18/2020  Nervous, Anxious, on Edge 0 0 0 0  Control/stop worrying 0 0 0 0  Worry too  much - different things 0 0 0 0  Trouble relaxing 0 0 0 0  Restless 0 0 0 0  Easily annoyed or irritable 0 0 0 0  Afraid - awful might happen 0 0 0 0  Total GAD 7 Score 0 0 0 0  Anxiety Difficulty - Not difficult at all - -    BP Readings from Last 3 Encounters:  07/19/21 120/70  06/21/21 133/70  06/02/21 112/64    Physical Exam Vitals and nursing note reviewed.  Constitutional:      Appearance: She is well-developed.  HENT:     Head: Normocephalic.     Right Ear: Hearing, tympanic membrane, ear canal and external ear normal. There is no impacted cerumen.     Left Ear: Hearing, tympanic membrane, ear canal and external ear normal. There is no impacted cerumen.     Nose: Nose normal. No congestion or rhinorrhea.     Right Turbinates: Swollen.     Left Turbinates: Swollen.     Mouth/Throat:     Lips: Pink.     Mouth: Mucous membranes are moist.     Dentition: Normal dentition.     Pharynx: Oropharynx is clear. Uvula midline. No pharyngeal swelling or posterior oropharyngeal erythema.  Eyes:     General: Lids are everted, no foreign bodies appreciated. No scleral icterus.       Left eye: No foreign body or hordeolum.     Extraocular Movements: Extraocular movements intact.     Conjunctiva/sclera: Conjunctivae normal.     Right eye: Right conjunctiva is not injected.     Left eye: Left conjunctiva is not injected.     Pupils: Pupils are equal, round, and reactive to light.  Neck:     Thyroid: No thyromegaly.     Vascular: No JVD.     Trachea: No tracheal deviation.  Cardiovascular:     Rate and Rhythm: Normal rate and regular rhythm.     Heart sounds: Normal heart sounds, S1 normal and S2 normal. No murmur heard. No systolic murmur is present.  No diastolic murmur is present.    No friction rub. No gallop. No S3 or S4 sounds.  Pulmonary:     Effort: Pulmonary effort is normal. No respiratory distress.     Breath sounds: Normal breath sounds. No decreased breath sounds,  wheezing, rhonchi or rales.  Abdominal:     General: Bowel sounds are normal.     Palpations: Abdomen is soft. There is no mass.     Tenderness: There is no abdominal tenderness. There is no guarding or rebound.  Musculoskeletal:        General: No tenderness. Normal range of motion.     Cervical back: Normal range of motion and neck supple.  Lymphadenopathy:     Cervical: No cervical adenopathy.  Skin:  General: Skin is warm.     Findings: No rash.  Neurological:     Mental Status: She is alert and oriented to person, place, and time.     Cranial Nerves: No cranial nerve deficit.     Deep Tendon Reflexes: Reflexes normal.  Psychiatric:        Mood and Affect: Mood is not anxious or depressed.    Wt Readings from Last 3 Encounters:  07/19/21 127 lb (57.6 kg)  06/21/21 127 lb (57.6 kg)  06/02/21 129 lb (58.5 kg)    BP 120/70    Pulse 100    Ht 5' 9"  (1.753 m)    Wt 127 lb (57.6 kg)    BMI 18.75 kg/m   Assessment and Plan: 1. Acute maxillary sinusitis, recurrence not specified New onset.  Acute.  Patient has symptoms of sinus pressure with productive cough nasal drainage that is yellow in nature.  There is tenderness over the maxillary sinuses and we will treat with Augmentin 875 mg 1 twice a day and patient's been encouraged to continue her Zyrtec, Singulair, and Robitussin for cough. - amoxicillin-clavulanate (AUGMENTIN) 875-125 MG tablet; Take 1 tablet by mouth 2 (two) times daily.  Dispense: 20 tablet; Refill: 0

## 2021-08-19 DIAGNOSIS — L570 Actinic keratosis: Secondary | ICD-10-CM | POA: Diagnosis not present

## 2021-08-19 DIAGNOSIS — H61002 Unspecified perichondritis of left external ear: Secondary | ICD-10-CM | POA: Diagnosis not present

## 2021-09-13 DIAGNOSIS — L570 Actinic keratosis: Secondary | ICD-10-CM | POA: Diagnosis not present

## 2021-09-13 DIAGNOSIS — L578 Other skin changes due to chronic exposure to nonionizing radiation: Secondary | ICD-10-CM | POA: Diagnosis not present

## 2021-09-13 DIAGNOSIS — Z85828 Personal history of other malignant neoplasm of skin: Secondary | ICD-10-CM | POA: Diagnosis not present

## 2021-09-13 DIAGNOSIS — Z872 Personal history of diseases of the skin and subcutaneous tissue: Secondary | ICD-10-CM | POA: Diagnosis not present

## 2021-09-21 ENCOUNTER — Ambulatory Visit (INDEPENDENT_AMBULATORY_CARE_PROVIDER_SITE_OTHER): Payer: Medicare Other | Admitting: Family Medicine

## 2021-09-21 ENCOUNTER — Other Ambulatory Visit: Payer: Self-pay

## 2021-09-21 ENCOUNTER — Encounter: Payer: Self-pay | Admitting: Family Medicine

## 2021-09-21 VITALS — BP 130/80 | HR 68 | Ht 69.0 in | Wt 123.0 lb

## 2021-09-21 DIAGNOSIS — Z1231 Encounter for screening mammogram for malignant neoplasm of breast: Secondary | ICD-10-CM | POA: Diagnosis not present

## 2021-09-21 DIAGNOSIS — I10 Essential (primary) hypertension: Secondary | ICD-10-CM

## 2021-09-21 DIAGNOSIS — Z0000 Encounter for general adult medical examination without abnormal findings: Secondary | ICD-10-CM | POA: Diagnosis not present

## 2021-09-21 DIAGNOSIS — E7800 Pure hypercholesterolemia, unspecified: Secondary | ICD-10-CM

## 2021-09-21 DIAGNOSIS — E032 Hypothyroidism due to medicaments and other exogenous substances: Secondary | ICD-10-CM | POA: Diagnosis not present

## 2021-09-21 DIAGNOSIS — H6121 Impacted cerumen, right ear: Secondary | ICD-10-CM

## 2021-09-21 MED ORDER — CARBAMIDE PEROXIDE 6.5 % OT SOLN: 5.0000 [drp] | Freq: Two times a day (BID) | OTIC | 0 refills | Status: DC

## 2021-09-21 NOTE — Progress Notes (Signed)
? ? ?Date:  09/21/2021  ? ?Name:  Victoria Rush   DOB:  1946-03-17   MRN:  [id number] ? ? ?Chief Complaint: Annual Exam ? ?Patient is a 76 year old female who presents for a comprehensive physical exam. The patient reports the following problems: allergic rhinitis. Health maintenance has been reviewed mammagraphy. ?  ? ? ?Lab Results  ?Component Value Date  ? NA 132 (L) 01/05/2021  ? K 4.5 01/05/2021  ? CO2 25 01/05/2021  ? GLUCOSE 113 (H) 01/05/2021  ? BUN 9 01/05/2021  ? CREATININE 0.86 01/05/2021  ? CALCIUM 9.5 01/05/2021  ? EGFR 70 01/05/2021  ? GFRNONAA 86 06/12/2020  ? ?Lab Results  ?Component Value Date  ? CHOL 215 (H) 01/05/2021  ? HDL 61 01/05/2021  ? LDLCALC 134 (H) 01/05/2021  ? TRIG 112 01/05/2021  ? CHOLHDL 3.2 08/28/2018  ? ?Lab Results  ?Component Value Date  ? TSH 1.900 01/05/2021  ? ?No results found for: HGBA1C ?No results found for: WBC, HGB, HCT, MCV, PLT ?No results found for: ALT, AST, GGT, ALKPHOS, BILITOT ?No results found for: 25OHVITD2, [place], VD25OH  ? ?Review of Systems  ?Constitutional:  Negative for chills and fever.  ?HENT:  Positive for congestion, rhinorrhea and sneezing. Negative for drooling, ear discharge, ear pain and sore throat.   ?Respiratory:  Negative for cough, shortness of breath and wheezing.   ?Cardiovascular:  Negative for chest pain, palpitations and leg swelling.  ?Gastrointestinal:  Negative for abdominal pain, blood in stool, constipation, diarrhea and nausea.  ?Endocrine: Negative for polydipsia.  ?Genitourinary:  Negative for dysuria, frequency, hematuria and urgency.  ?Musculoskeletal:  Negative for back pain, myalgias and neck pain.  ?Skin:  Negative for rash.  ?Allergic/Immunologic: Negative for environmental allergies.  ?Neurological:  Negative for dizziness and headaches.  ?Hematological:  Does not bruise/bleed easily.  ?Psychiatric/Behavioral:  Negative for suicidal ideas. The patient is not nervous/anxious.   ? ?Patient Active Problem List  ? Diagnosis Date  Noted  ? Esophageal reflux 02/12/2016  ? Essential hypertension 02/12/2016  ? Chronic seasonal allergic rhinitis due to pollen 12/04/2015  ? Hypothyroidism 01/30/2015  ? Hypercholesterolemia 01/30/2015  ? ? ?Allergies  ?Allergen Reactions  ? Sulfa Antibiotics   ? ? ?Past Surgical History:  ?Procedure Laterality Date  ? APPENDECTOMY    ? COLONOSCOPY  2008  ? Dr Sonny Masters- repeat in 10 years  ? SKIN CANCER EXCISION    ? foot  ? ? ?Social History  ? ?Tobacco Use  ? Smoking status: Never  ? Smokeless tobacco: Never  ?Vaping Use  ? Vaping Use: Never used  ?Substance Use Topics  ? Alcohol use: No  ?  Alcohol/week: 0.0 standard drinks  ? Drug use: No  ? ? ? ?Medication list has been reviewed and updated. ? ?Current Meds  ?Medication Sig  ? amLODipine (NORVASC) 5 MG tablet Take 1 tablet (5 mg total) by mouth daily.  ? cetirizine (ZYRTEC) 10 MG tablet Take 1 tablet (10 mg total) by mouth daily.  ? etodolac (LODINE) 500 MG tablet Take 1 tablet by mouth twice daily  ? levothyroxine (SYNTHROID) 25 MCG tablet Take 1 tablet (25 mcg total) by mouth daily.  ? losartan (COZAAR) 50 MG tablet Take 1 tablet (50 mg total) by mouth daily.  ? montelukast (SINGULAIR) 10 MG tablet Take 1 tablet (10 mg total) by mouth at bedtime.  ? pantoprazole (PROTONIX) 40 MG tablet Take 1 tablet (40 mg total) by mouth daily.  ? ? ?  PHQ 2/9 Scores 09/21/2021 07/19/2021 06/21/2021 06/02/2021  ?PHQ - 2 Score 0 0 0 0  ?PHQ- 9 Score 0 0 0 0  ? ? ?GAD 7 : Generalized Anxiety Score 09/21/2021 06/21/2021 06/02/2021 03/23/2021  ?Nervous, Anxious, on Edge 0 0 0 0  ?Control/stop worrying 0 0 0 0  ?Worry too much - different things 0 0 0 0  ?Trouble relaxing 0 0 0 0  ?Restless 0 0 0 0  ?Easily annoyed or irritable 0 0 0 0  ?Afraid - awful might happen 0 0 0 0  ?Total GAD 7 Score 0 0 0 0  ?Anxiety Difficulty Not difficult at all - Not difficult at all -  ? ? ?BP Readings from Last 3 Encounters:  ?09/21/21 130/80  ?07/19/21 120/70  ?06/21/21 133/70  ? ? ?Physical Exam ?Vitals  and nursing note reviewed.  ?Constitutional:   ?   Appearance: She is well-developed, well-groomed and normal weight.  ?HENT:  ?   Head: Normocephalic.  ?   Jaw: There is normal jaw occlusion.  ?   Right Ear: Hearing, tympanic membrane, ear canal and external ear normal. There is impacted cerumen.  ?   Left Ear: Hearing, tympanic membrane, ear canal and external ear normal. There is no impacted cerumen.  ?   Nose: Nose normal. No congestion or rhinorrhea.  ?   Right Turbinates: Not swollen.  ?   Left Turbinates: Not swollen.  ?   Right Sinus: No maxillary sinus tenderness or frontal sinus tenderness.  ?   Left Sinus: No maxillary sinus tenderness or frontal sinus tenderness.  ?   Mouth/Throat:  ?   Lips: Pink.  ?   Mouth: Mucous membranes are moist.  ?   Tongue: No lesions.  ?   Palate: No mass and lesions.  ?   Pharynx: Oropharynx is clear. Uvula midline.  ?   Tonsils: No tonsillar exudate or tonsillar abscesses.  ?Eyes:  ?   General: Lids are everted, no foreign bodies appreciated. No scleral icterus.    ?   Left eye: No foreign body or hordeolum.  ?   Extraocular Movements: Extraocular movements intact.  ?   Conjunctiva/sclera: Conjunctivae normal.  ?   Right eye: Right conjunctiva is not injected.  ?   Left eye: Left conjunctiva is not injected.  ?   Pupils: Pupils are equal, round, and reactive to light.  ?   Funduscopic exam: ?   Right eye: Red reflex present.     ?   Left eye: Red reflex present. ?Neck:  ?   Thyroid: No thyroid mass, thyromegaly or thyroid tenderness.  ?   Vascular: Normal carotid pulses. No carotid bruit, hepatojugular reflux or JVD.  ?   Trachea: Trachea and phonation normal. No tracheal deviation.  ?Cardiovascular:  ?   Rate and Rhythm: Normal rate and regular rhythm.  ?   Chest Wall: PMI is not displaced.  ?   Pulses: Normal pulses.  ?   Heart sounds: Normal heart sounds, S1 normal and S2 normal. No murmur heard. ?No systolic murmur is present.  ?No diastolic murmur is present.  ?  No  friction rub. No gallop. No S3 or S4 sounds.  ?Pulmonary:  ?   Effort: Pulmonary effort is normal. No respiratory distress.  ?   Breath sounds: Normal breath sounds. No decreased breath sounds, wheezing, rhonchi or rales.  ?Chest:  ?Breasts: ?   Right: Normal. No swelling, bleeding, inverted nipple, mass, nipple discharge, skin  change or tenderness.  ?   Left: Normal. No swelling, bleeding, inverted nipple, mass, nipple discharge, skin change or tenderness.  ?Abdominal:  ?   General: Bowel sounds are normal.  ?   Palpations: Abdomen is soft. There is no hepatomegaly, splenomegaly or mass.  ?   Tenderness: There is no abdominal tenderness. There is no right CVA tenderness, left CVA tenderness, guarding or rebound.  ?   Hernia: There is no hernia in the left inguinal area or right inguinal area.  ?Genitourinary: ?   Labia:     ?   Right: No rash, tenderness or lesion.     ?   Left: No rash, tenderness or lesion.   ?   Rectum: Normal. Guaiac result negative. No mass or internal hemorrhoid.  ?Musculoskeletal:     ?   General: No tenderness. Normal range of motion.  ?   Cervical back: Normal, normal range of motion and neck supple.  ?   Thoracic back: Scoliosis present.  ?   Lumbar back: Normal.  ?   Right lower leg: No edema.  ?   Left lower leg: No edema.  ?   Right foot: Normal range of motion. No deformity.  ?   Left foot: Normal range of motion. No deformity.  ?Lymphadenopathy:  ?   Head:  ?   Right side of head: No submental, submandibular or tonsillar adenopathy.  ?   Left side of head: No submental, submandibular or tonsillar adenopathy.  ?   Cervical: No cervical adenopathy.  ?   Right cervical: No superficial, deep or posterior cervical adenopathy. ?   Left cervical: No superficial, deep or posterior cervical adenopathy.  ?   Upper Body:  ?   Right upper body: No supraclavicular or axillary adenopathy.  ?   Left upper body: No supraclavicular or axillary adenopathy.  ?   Lower Body: No right inguinal  adenopathy. No left inguinal adenopathy.  ?Skin: ?   General: Skin is warm.  ?   Capillary Refill: Capillary refill takes less than 2 seconds.  ?   Findings: No rash.  ?   Comments: Mu;tiple seborreic keratoses

## 2021-09-22 ENCOUNTER — Other Ambulatory Visit: Payer: Self-pay

## 2021-09-22 DIAGNOSIS — E7800 Pure hypercholesterolemia, unspecified: Secondary | ICD-10-CM

## 2021-09-22 LAB — CBC WITH DIFFERENTIAL/PLATELET
Basophils Absolute: 0 10*3/uL (ref 0.0–0.2)
Basos: 1 %
EOS (ABSOLUTE): 0 10*3/uL (ref 0.0–0.4)
Eos: 0 %
Hematocrit: 42.5 % (ref 34.0–46.6)
Hemoglobin: 14.5 g/dL (ref 11.1–15.9)
Immature Grans (Abs): 0 10*3/uL (ref 0.0–0.1)
Immature Granulocytes: 0 %
Lymphocytes Absolute: 1.4 10*3/uL (ref 0.7–3.1)
Lymphs: 29 %
MCH: 28.5 pg (ref 26.6–33.0)
MCHC: 34.1 g/dL (ref 31.5–35.7)
MCV: 84 fL (ref 79–97)
Monocytes Absolute: 0.6 10*3/uL (ref 0.1–0.9)
Monocytes: 12 %
Neutrophils Absolute: 2.8 10*3/uL (ref 1.4–7.0)
Neutrophils: 58 %
Platelets: 227 10*3/uL (ref 150–450)
RBC: 5.08 x10E6/uL (ref 3.77–5.28)
RDW: 13.4 % (ref 11.7–15.4)
WBC: 4.9 10*3/uL (ref 3.4–10.8)

## 2021-09-22 LAB — RENAL FUNCTION PANEL
Albumin: 4.5 g/dL (ref 3.7–4.7)
BUN/Creatinine Ratio: 11 — ABNORMAL LOW (ref 12–28)
BUN: 8 mg/dL (ref 8–27)
CO2: 24 mmol/L (ref 20–29)
Calcium: 9.3 mg/dL (ref 8.7–10.3)
Chloride: 94 mmol/L — ABNORMAL LOW (ref 96–106)
Creatinine, Ser: 0.76 mg/dL (ref 0.57–1.00)
Glucose: 97 mg/dL (ref 70–99)
Phosphorus: 3.9 mg/dL (ref 3.0–4.3)
Potassium: 4 mmol/L (ref 3.5–5.2)
Sodium: 134 mmol/L (ref 134–144)
eGFR: 81 mL/min/{1.73_m2} (ref >59)

## 2021-09-22 LAB — LIPID PANEL WITH LDL/HDL RATIO
Cholesterol, Total: 209 mg/dL — ABNORMAL HIGH (ref 100–199)
HDL: 61 mg/dL (ref >39)
LDL Chol Calc (NIH): 132 mg/dL — ABNORMAL HIGH (ref 0–99)
LDL/HDL Ratio: 2.2 ratio (ref 0.0–3.2)
Triglycerides: 89 mg/dL (ref 0–149)
VLDL Cholesterol Cal: 16 mg/dL (ref 5–40)

## 2021-09-22 LAB — TSH: TSH: 3.23 u[IU]/mL (ref 0.450–4.500)

## 2021-09-22 MED ORDER — ATORVASTATIN CALCIUM 10 MG PO TABS: 10.0000 mg | ORAL_TABLET | Freq: Every day | ORAL | 0 refills | Status: DC

## 2021-09-22 NOTE — Progress Notes (Signed)
Sent in Lipitor ?

## 2021-10-18 ENCOUNTER — Ambulatory Visit: Payer: Medicare Other

## 2021-10-25 ENCOUNTER — Ambulatory Visit (INDEPENDENT_AMBULATORY_CARE_PROVIDER_SITE_OTHER): Payer: Medicare Other

## 2021-10-25 DIAGNOSIS — Z0000 Encounter for general adult medical examination without abnormal findings: Secondary | ICD-10-CM | POA: Diagnosis not present

## 2021-10-25 NOTE — Progress Notes (Signed)
? ?Subjective:  ? Victoria Rush is a 76 y.o. female who presents for Medicare Annual (Subsequent) preventive examination. ? ?Virtual Visit via Telephone Note ? ?I connected with  Thurman Coyer on 10/25/21 at  1:15 PM EDT by telephone and verified that I am speaking with the correct person using two identifiers. ? ?Location: ?Patient: home ?Provider: [facility] ?Persons participating in the virtual visit: patient/Nurse Health Advisor ?  ?I discussed the limitations, risks, security and privacy concerns of performing an evaluation and management service by telephone and the availability of in person appointments. The patient expressed understanding and agreed to proceed. ? ?Interactive audio and video telecommunications were attempted between this nurse and patient, however failed, due to patient having technical difficulties OR patient did not have access to video capability.  We continued and completed visit with audio only. ? ?Some vital signs may be absent or patient reported.  ? ?Clemetine Marker, LPN ? ? ?Review of Systems    ? ?Cardiac Risk Factors include: advanced age (>70mn, >>80women);hypertension ? ?   ?Objective:  ?  ?There were no vitals filed for this visit. ?There is no height or weight on file to calculate BMI. ? ? ?  10/25/2021  ?  1:21 PM 10/14/2020  ?  8:14 AM 01/30/2015  ?  8:14 AM  ?Advanced Directives  ?Does Patient Have a Medical Advance Directive? No No No  ?Does patient want to make changes to medical advance directive? No - Patient declined    ?Would patient like information on creating a medical advance directive?  No - Patient declined No - patient declined information  ? ? ?Current Medications (verified) ?Outpatient Encounter Medications as of 10/25/2021  ?Medication Sig  ? amLODipine (NORVASC) 5 MG tablet Take 1 tablet (5 mg total) by mouth daily.  ? atorvastatin (LIPITOR) 10 MG tablet Take 1 tablet (10 mg total) by mouth daily.  ? carbamide peroxide (DEBROX) 6.5 % OTIC solution Place 5 drops into both  ears 2 (two) times daily.  ? cetirizine (ZYRTEC) 10 MG tablet Take 1 tablet (10 mg total) by mouth daily.  ? etodolac (LODINE) 500 MG tablet Take 1 tablet by mouth twice daily  ? levothyroxine (SYNTHROID) 25 MCG tablet Take 1 tablet (25 mcg total) by mouth daily.  ? losartan (COZAAR) 50 MG tablet Take 1 tablet (50 mg total) by mouth daily.  ? montelukast (SINGULAIR) 10 MG tablet Take 1 tablet (10 mg total) by mouth at bedtime.  ? pantoprazole (PROTONIX) 40 MG tablet Take 1 tablet (40 mg total) by mouth daily.  ? ?No facility-administered encounter medications on file as of 10/25/2021.  ? ? ?Allergies (verified) ?Sulfa antibiotics  ? ?History: ?Past Medical History:  ?Diagnosis Date  ? Cancer ([facility] 1981  ? melanoma  ? Contact dermatitis 02/12/2016  ? GERD (gastroesophageal reflux disease)   ? Hyperlipidemia   ? Hypertension   ? Skin cancer 04/2019  ? on the nose  ? Thyroid disease   ? ?Past Surgical History:  ?Procedure Laterality Date  ? APPENDECTOMY    ? COLONOSCOPY  2008  ? Dr SSonny Masters repeat in 10 years  ? SKIN CANCER EXCISION    ? foot  ? ?Family History  ?Problem Relation Age of Onset  ? Breast cancer Cousin   ?     mat cousin  ? Diabetes Sister   ? Diabetes Son   ? ?Social History  ? ?Socioeconomic History  ? Marital status: Married  ?  Spouse name:  Not on file  ? Number of children: 2  ? Years of education: Not on file  ? Highest education level: Not on file  ?Occupational History  ? Not on file  ?Tobacco Use  ? Smoking status: Never  ? Smokeless tobacco: Never  ?Vaping Use  ? Vaping Use: Never used  ?Substance and Sexual Activity  ? Alcohol use: No  ?  Alcohol/week: 0.0 standard drinks  ? Drug use: No  ? Sexual activity: Not Currently  ?Other Topics Concern  ? Not on file  ?Social History Narrative  ? Not on file  ? ?Social Determinants of Health  ? ?Financial Resource Strain: Low Risk   ? Difficulty of Paying Living Expenses: Not hard at all  ?Food Insecurity: No Food Insecurity  ? Worried About Sales executive in the Last Year: Never true  ? Ran Out of Food in the Last Year: Never true  ?Transportation Needs: No Transportation Needs  ? Lack of Transportation (Medical): No  ? Lack of Transportation (Non-Medical): No  ?Physical Activity: Insufficiently Active  ? Days of Exercise per Week: 5 days  ? Minutes of Exercise per Session: 20 min  ?Stress: No Stress Concern Present  ? Feeling of Stress : Not at all  ?Social Connections: Moderately Isolated  ? Frequency of Communication with Friends and Family: More than three times a week  ? Frequency of Social Gatherings with Friends and Family: More than three times a week  ? Attends Religious Services: Never  ? Active Member of Clubs or Organizations: No  ? Attends Archivist Meetings: Never  ? Marital Status: Married  ? ? ?Tobacco Counseling ?Counseling given: Not Answered ? ? ?Clinical Intake: ? ?Pre-visit preparation completed: Yes ? ?Pain : No/denies pain ? ?  ? ?Nutritional Risks: None ?Diabetes: No ? ?How often do you need to have someone help you when you read instructions, pamphlets, or other written materials from your doctor or pharmacy?: 1 - Never ? ? ? ?Interpreter Needed?: No ? ?Information entered by :: Clemetine Marker LPN ? ? ?Activities of Daily Living ? ?  10/25/2021  ?  1:22 PM 06/02/2021  ?  1:42 PM  ?In your present state of health, do you have any difficulty performing the following activities:  ?Hearing? 1 1  ?Vision? 0 0  ?Difficulty concentrating or making decisions? 0 1  ?Walking or climbing stairs? 0 0  ?Dressing or bathing? 0 0  ?Doing errands, shopping? 0 0  ?Preparing Food and eating ? N   ?Using the Toilet? N   ?In the past six months, have you accidently leaked urine? N   ?Do you have problems with loss of bowel control? N   ?Managing your Medications? N   ?Managing your Finances? N   ?Housekeeping or managing your Housekeeping? N   ? ? ?Patient Care Team: ?Juline Patch, MD as PCP - General (Family Medicine) ? ?Indicate any recent  Medical Services you may have received from other than [facility] providers in the past year (date may be approximate). ? ?   ?Assessment:  ? This is a routine wellness examination for Victoria Rush. ? ?Hearing/Vision screen ?Hearing Screening - Comments:: Pt wears hearing aids  ?Vision Screening - Comments:: Annual vision screenings with Dr. Mallie Mussel ? ?Dietary issues and exercise activities discussed: ?Current Exercise Habits: Home exercise routine, Type of exercise: walking, Time (Minutes): 20, Frequency (Times/Week): 6, Weekly Exercise (Minutes/Week): 120, Intensity: Moderate, Exercise limited by: None identified ? ? Goals  Addressed   ?None ?  ? ?Depression Screen ? ?  10/25/2021  ?  1:20 PM 09/21/2021  ?  8:18 AM 07/19/2021  ?  8:43 AM 06/21/2021  ?  2:43 PM 06/02/2021  ?  1:39 PM 03/23/2021  ? 10:34 AM 10/14/2020  ?  8:13 AM  ?PHQ 2/9 Scores  ?PHQ - 2 Score 0 0 0 0 0 0 0  ?PHQ- 9 Score  0 0 0 0 0   ?  ?Fall Risk ? ?  10/25/2021  ?  1:22 PM 06/02/2021  ?  1:42 PM 03/23/2021  ? 10:33 AM 10/14/2020  ?  8:15 AM 01/02/2020  ?  8:20 AM  ?Fall Risk   ?Falls in the past year? 0 0 0 0 0  ?Number falls in past yr: 0 0 0 0   ?Injury with Fall? 0 0 0 0   ?Risk for fall due to : No Fall Risks No Fall Risks No Fall Risks No Fall Risks   ?Follow up Falls prevention discussed Falls evaluation completed Falls evaluation completed Falls prevention discussed Falls evaluation completed  ? ? ?FALL RISK PREVENTION PERTAINING TO THE HOME: ? ?Any stairs in or around the home? Yes  ?If so, are there any without handrails? No  ?Home free of loose throw rugs in walkways, pet beds, electrical cords, etc? Yes  ?Adequate lighting in your home to reduce risk of falls? Yes  ? ?ASSISTIVE DEVICES UTILIZED TO PREVENT FALLS: ? ?Life alert? No  ?Use of a cane, walker or w/c? No  ?Grab bars in the bathroom? Yes  ?Shower chair or bench in shower? Yes  ?Elevated toilet seat or a handicapped toilet? Yes  ? ?TIMED UP AND GO: ? ?Was the test performed? No . Telephonic visit.   ? ?Cognitive Function: Normal cognitive status assessed by direct observation by this Nurse Health Advisor. No abnormalities found.  ? ?  ?  ? ?  08/22/2016  ?  8:53 AM  ?6CIT Screen  ?What Year? 0 points  ?Wh

## 2021-10-25 NOTE — Patient Instructions (Signed)
Victoria Rush , ?Thank you for taking time to come for your Medicare Wellness Visit. I appreciate your ongoing commitment to your health goals. Please review the following plan we discussed and let me know if I can assist you in the future.  ? ?Screening recommendations/referrals: ?Colonoscopy: no longer required ?Mammogram: done 10/08/20. Scheduled 11/02/21 ?Bone Density: no longer required ?Recommended yearly ophthalmology/optometry visit for glaucoma screening and checkup ?Recommended yearly dental visit for hygiene and checkup ? ?Vaccinations: ?Influenza vaccine: done 03/23/21 ?Pneumococcal vaccine: done 08/23/17 ?Tdap vaccine: done 08/15/16 ?Shingles vaccine: Shingrix discussed. Please contact your pharmacy for coverage information.  ?Covid-19:done 08/08/19, 08/29/19, 05/18/20 & 12/24/20 ? ?Advanced directives: Advance directive discussed with you today. Even though you declined this today please call our office should you change your mind and we can give you the proper paperwork for you to fill out.  ? ?Conditions/risks identified: Keep up the great work! ? ?Next appointment: Follow up in one year for your annual wellness visit  ? ? ?Preventive Care 65 Years and Older, Female ?Preventive care refers to lifestyle choices and visits with your health care provider that can promote health and wellness. ?What does preventive care include? ?A yearly physical exam. This is also called an annual well check. ?Dental exams once or twice a year. ?Routine eye exams. Ask your health care provider how often you should have your eyes checked. ?Personal lifestyle choices, including: ?Daily care of your teeth and gums. ?Regular physical activity. ?Eating a healthy diet. ?Avoiding tobacco and drug use. ?Limiting alcohol use. ?Practicing safe sex. ?Taking low-dose aspirin every day. ?Taking vitamin and mineral supplements as recommended by your health care provider. ?What happens during an annual well check? ?The services and screenings done  by your health care provider during your annual well check will depend on your age, overall health, lifestyle risk factors, and family history of disease. ?Counseling  ?Your health care provider may ask you questions about your: ?Alcohol use. ?Tobacco use. ?Drug use. ?Emotional well-being. ?Home and relationship well-being. ?Sexual activity. ?Eating habits. ?History of falls. ?Memory and ability to understand (cognition). ?Work and work Statistician. ?Reproductive health. ?Screening  ?You may have the following tests or measurements: ?Height, weight, and BMI. ?Blood pressure. ?Lipid and cholesterol levels. These may be checked every 5 years, or more frequently if you are over 21 years old. ?Skin check. ?Lung cancer screening. You may have this screening every year starting at age 39 if you have a 30-pack-year history of smoking and currently smoke or have quit within the past 15 years. ?Fecal occult blood test (FOBT) of the stool. You may have this test every year starting at age 61. ?Flexible sigmoidoscopy or colonoscopy. You may have a sigmoidoscopy every 5 years or a colonoscopy every 10 years starting at age 68. ?Hepatitis C blood test. ?Hepatitis B blood test. ?Sexually transmitted disease (STD) testing. ?Diabetes screening. This is done by checking your blood sugar (glucose) after you have not eaten for a while (fasting). You may have this done every 1-3 years. ?Bone density scan. This is done to screen for osteoporosis. You may have this done starting at age 62. ?Mammogram. This may be done every 1-2 years. Talk to your health care provider about how often you should have regular mammograms. ?Talk with your health care provider about your test results, treatment options, and if necessary, the need for more tests. ?Vaccines  ?Your health care provider may recommend certain vaccines, such as: ?Influenza vaccine. This is recommended every year. ?Tetanus,  diphtheria, and acellular pertussis (Tdap, Td) vaccine. You  may need a Td booster every 10 years. ?Zoster vaccine. You may need this after age 20. ?Pneumococcal 13-valent conjugate (PCV13) vaccine. One dose is recommended after age 60. ?Pneumococcal polysaccharide (PPSV23) vaccine. One dose is recommended after age 70. ?Talk to your health care provider about which screenings and vaccines you need and how often you need them. ?This information is not intended to replace advice given to you by your health care provider. Make sure you discuss any questions you have with your health care provider. ?Document Released: 07/17/2015 Document Revised: 03/09/2016 Document Reviewed: 04/21/2015 ?Elsevier Interactive Patient Education ? 2017 [place]. ? ?Fall Prevention in the Home ?Falls can cause injuries. They can happen to people of all ages. There are many things you can do to make your home safe and to help prevent falls. ?What can I do on the outside of my home? ?Regularly fix the edges of walkways and driveways and fix any cracks. ?Remove anything that might make you trip as you walk through a door, such as a raised step or threshold. ?Trim any bushes or trees on the path to your home. ?Use bright outdoor lighting. ?Clear any walking paths of anything that might make someone trip, such as rocks or tools. ?Regularly check to see if handrails are loose or broken. Make sure that both sides of any steps have handrails. ?Any raised decks and porches should have guardrails on the edges. ?Have any leaves, snow, or ice cleared regularly. ?Use sand or salt on walking paths during winter. ?Clean up any spills in your garage right away. This includes oil or grease spills. ?What can I do in the bathroom? ?Use night lights. ?Install grab bars by the toilet and in the tub and shower. Do not use towel bars as grab bars. ?Use non-skid mats or decals in the tub or shower. ?If you need to sit down in the shower, use a plastic, non-slip stool. ?Keep the floor dry. Clean up any water that spills  on the floor as soon as it happens. ?Remove soap buildup in the tub or shower regularly. ?Attach bath mats securely with double-sided non-slip rug tape. ?Do not have throw rugs and other things on the floor that can make you trip. ?What can I do in the bedroom? ?Use night lights. ?Make sure that you have a light by your bed that is easy to reach. ?Do not use any sheets or blankets that are too big for your bed. They should not hang down onto the floor. ?Have a firm chair that has side arms. You can use this for support while you get dressed. ?Do not have throw rugs and other things on the floor that can make you trip. ?What can I do in the kitchen? ?Clean up any spills right away. ?Avoid walking on wet floors. ?Keep items that you use a lot in easy-to-reach places. ?If you need to reach something above you, use a strong step stool that has a grab bar. ?Keep electrical cords out of the way. ?Do not use floor polish or wax that makes floors slippery. If you must use wax, use non-skid floor wax. ?Do not have throw rugs and other things on the floor that can make you trip. ?What can I do with my stairs? ?Do not leave any items on the stairs. ?Make sure that there are handrails on both sides of the stairs and use them. Fix handrails that are broken or loose. Make  sure that handrails are as long as the stairways. ?Check any carpeting to make sure that it is firmly attached to the stairs. Fix any carpet that is loose or worn. ?Avoid having throw rugs at the top or bottom of the stairs. If you do have throw rugs, attach them to the floor with carpet tape. ?Make sure that you have a light switch at the top of the stairs and the bottom of the stairs. If you do not have them, ask someone to add them for you. ?What else can I do to help prevent falls? ?Wear shoes that: ?Do not have high heels. ?Have rubber bottoms. ?Are comfortable and fit you well. ?Are closed at the toe. Do not wear sandals. ?If you use a stepladder: ?Make  sure that it is fully opened. Do not climb a closed stepladder. ?Make sure that both sides of the stepladder are locked into place. ?Ask someone to hold it for you, if possible. ?Clearly mark and make

## 2021-11-02 ENCOUNTER — Ambulatory Visit
Admission: RE | Admit: 2021-11-02 | Discharge: 2021-11-02 | Disposition: A | Discharge status: Home / Self Care | Payer: Medicare Other | Source: Ambulatory Visit | Attending: Family Medicine | Admitting: Family Medicine

## 2021-11-02 DIAGNOSIS — Z1231 Encounter for screening mammogram for malignant neoplasm of breast: Secondary | ICD-10-CM | POA: Diagnosis not present

## 2021-12-23 ENCOUNTER — Ambulatory Visit: Payer: Medicare Other | Admitting: Family Medicine

## 2021-12-23 ENCOUNTER — Other Ambulatory Visit: Payer: Self-pay

## 2021-12-23 DIAGNOSIS — E032 Hypothyroidism due to medicaments and other exogenous substances: Secondary | ICD-10-CM

## 2021-12-23 DIAGNOSIS — E7800 Pure hypercholesterolemia, unspecified: Secondary | ICD-10-CM

## 2021-12-23 DIAGNOSIS — J301 Allergic rhinitis due to pollen: Secondary | ICD-10-CM

## 2021-12-23 DIAGNOSIS — K219 Gastro-esophageal reflux disease without esophagitis: Secondary | ICD-10-CM

## 2021-12-23 DIAGNOSIS — I10 Essential (primary) hypertension: Secondary | ICD-10-CM

## 2021-12-23 MED ORDER — LEVOTHYROXINE SODIUM 25 MCG PO TABS: 25.0000 ug | ORAL_TABLET | Freq: Every day | ORAL | 0 refills | Status: DC

## 2021-12-23 MED ORDER — AMLODIPINE BESYLATE 5 MG PO TABS: 5.0000 mg | ORAL_TABLET | Freq: Every day | ORAL | 0 refills | Status: DC

## 2021-12-23 MED ORDER — ATORVASTATIN CALCIUM 10 MG PO TABS: 10.0000 mg | ORAL_TABLET | Freq: Every day | ORAL | 0 refills | Status: DC

## 2021-12-23 MED ORDER — LOSARTAN POTASSIUM 50 MG PO TABS: 50.0000 mg | ORAL_TABLET | Freq: Every day | ORAL | 0 refills | Status: DC

## 2021-12-23 MED ORDER — PANTOPRAZOLE SODIUM 40 MG PO TBEC: 40.0000 mg | DELAYED_RELEASE_TABLET | Freq: Every day | ORAL | 0 refills | Status: DC

## 2021-12-23 MED ORDER — MONTELUKAST SODIUM 10 MG PO TABS: 10.0000 mg | ORAL_TABLET | Freq: Every day | ORAL | 0 refills | Status: DC

## 2022-03-25 ENCOUNTER — Ambulatory Visit (INDEPENDENT_AMBULATORY_CARE_PROVIDER_SITE_OTHER): Payer: Medicare Other | Admitting: Family Medicine

## 2022-03-25 ENCOUNTER — Encounter: Payer: Self-pay | Admitting: Family Medicine

## 2022-03-25 VITALS — BP 120/80 | HR 88 | Ht 69.0 in | Wt 127.0 lb

## 2022-03-25 DIAGNOSIS — E7800 Pure hypercholesterolemia, unspecified: Secondary | ICD-10-CM

## 2022-03-25 DIAGNOSIS — Z23 Encounter for immunization: Secondary | ICD-10-CM | POA: Diagnosis not present

## 2022-03-25 DIAGNOSIS — I10 Essential (primary) hypertension: Secondary | ICD-10-CM

## 2022-03-25 DIAGNOSIS — K219 Gastro-esophageal reflux disease without esophagitis: Secondary | ICD-10-CM

## 2022-03-25 DIAGNOSIS — J301 Allergic rhinitis due to pollen: Secondary | ICD-10-CM

## 2022-03-25 DIAGNOSIS — E032 Hypothyroidism due to medicaments and other exogenous substances: Secondary | ICD-10-CM

## 2022-03-25 DIAGNOSIS — M79671 Pain in right foot: Secondary | ICD-10-CM

## 2022-03-25 MED ORDER — PANTOPRAZOLE SODIUM 40 MG PO TBEC: 40.0000 mg | DELAYED_RELEASE_TABLET | Freq: Every day | ORAL | 1 refills | Status: DC

## 2022-03-25 MED ORDER — LOSARTAN POTASSIUM 50 MG PO TABS: 50.0000 mg | ORAL_TABLET | Freq: Every day | ORAL | 1 refills | Status: DC

## 2022-03-25 MED ORDER — MONTELUKAST SODIUM 10 MG PO TABS: 10.0000 mg | ORAL_TABLET | Freq: Every day | ORAL | 1 refills | Status: DC

## 2022-03-25 MED ORDER — AMLODIPINE BESYLATE 5 MG PO TABS: 5.0000 mg | ORAL_TABLET | Freq: Every day | ORAL | 1 refills | Status: DC

## 2022-03-25 MED ORDER — LEVOTHYROXINE SODIUM 25 MCG PO TABS: 25.0000 ug | ORAL_TABLET | Freq: Every day | ORAL | 1 refills | Status: DC

## 2022-03-25 MED ORDER — ETODOLAC 500 MG PO TABS: 500.0000 mg | ORAL_TABLET | Freq: Two times a day (BID) | ORAL | 1 refills | Status: DC

## 2022-03-25 MED ORDER — ATORVASTATIN CALCIUM 10 MG PO TABS: 10.0000 mg | ORAL_TABLET | Freq: Every day | ORAL | 1 refills | Status: DC

## 2022-03-25 NOTE — Progress Notes (Signed)
Date:  03/25/2022   Name:  Victoria Rush   DOB:  January 19, 1946   MRN:  [id number]   Chief Complaint: Back Pain, Hypothyroidism, Hypertension, Hyperlipidemia, Allergic Rhinitis , and Flu Vaccine  Back Pain This is a chronic problem. The current episode started more than 1 year ago. The problem occurs intermittently. The pain is present in the lumbar spine. The pain is moderate. The symptoms are aggravated by bending. Pertinent negatives include no chest pain, headaches or weight loss.  Hypertension This is a chronic problem. The current episode started more than 1 year ago. The problem has been waxing and waning since onset. The problem is controlled. Pertinent negatives include no anxiety, blurred vision, chest pain, headaches, malaise/fatigue, neck pain, orthopnea, palpitations, peripheral edema, PND, shortness of breath or sweats. There are no associated agents to hypertension. There are no known risk factors for coronary artery disease. Past treatments include calcium channel blockers and angiotensin blockers. The current treatment provides moderate improvement. There are no compliance problems.  There is no history of angina, kidney disease, CAD/MI, CVA, heart failure, left ventricular hypertrophy, PVD or retinopathy. Identifiable causes of hypertension include a thyroid problem. There is no history of chronic renal disease, a hypertension causing med or renovascular disease.  Hyperlipidemia This is a chronic problem. The current episode started more than 1 year ago. The problem is controlled. Recent lipid tests were reviewed and are normal. She has no history of chronic renal disease, diabetes, hypothyroidism, liver disease, obesity or nephrotic syndrome. Pertinent negatives include no chest pain, myalgias or shortness of breath. Current antihyperlipidemic treatment includes statins. The current treatment provides moderate improvement of lipids. There are no compliance problems.   Thyroid  Problem Presents for follow-up visit. Patient reports no anxiety, cold intolerance, constipation, depressed mood, diaphoresis, diarrhea, dry skin, fatigue, hair loss, heat intolerance, hoarse voice, leg swelling, menstrual problem, nail problem, palpitations, tremors, visual change, weight gain or weight loss. The symptoms have been stable. Her past medical history is significant for hyperlipidemia. There is no history of diabetes or heart failure.    Lab Results  Component Value Date   NA 134 09/21/2021   K 4.0 09/21/2021   CO2 24 09/21/2021   GLUCOSE 97 09/21/2021   BUN 8 09/21/2021   CREATININE 0.76 09/21/2021   CALCIUM 9.3 09/21/2021   EGFR 81 09/21/2021   GFRNONAA 86 06/12/2020   Lab Results  Component Value Date   CHOL 209 (H) 09/21/2021   HDL 61 09/21/2021   LDLCALC 132 (H) 09/21/2021   TRIG 89 09/21/2021   CHOLHDL 3.2 08/28/2018   Lab Results  Component Value Date   TSH 3.230 09/21/2021   No results found for: "HGBA1C" Lab Results  Component Value Date   WBC 4.9 09/21/2021   HGB 14.5 09/21/2021   HCT 42.5 09/21/2021   MCV 84 09/21/2021   PLT 227 09/21/2021   No results found for: "ALT", "AST", "GGT", "ALKPHOS", "BILITOT" No results found for: "25OHVITD2", "25OHVITD3", "VD25OH"   Review of Systems  Constitutional:  Negative for diaphoresis, fatigue, malaise/fatigue, weight gain and weight loss.  HENT:  Negative for hoarse voice.   Eyes:  Negative for blurred vision.  Respiratory:  Negative for shortness of breath.   Cardiovascular:  Negative for chest pain, palpitations, orthopnea and PND.  Gastrointestinal:  Negative for constipation and diarrhea.  Endocrine: Negative for cold intolerance and heat intolerance.  Genitourinary:  Negative for menstrual problem.  Musculoskeletal:  Positive for back pain. Negative for  myalgias and neck pain.  Neurological:  Negative for tremors and headaches.  Psychiatric/Behavioral:  The patient is not nervous/anxious.      Patient Active Problem List   Diagnosis Date Noted   Esophageal reflux 02/12/2016   Essential hypertension 02/12/2016   Chronic seasonal allergic rhinitis due to pollen 12/04/2015   Hypothyroidism 01/30/2015   Hypercholesterolemia 01/30/2015    Allergies  Allergen Reactions   Sulfa Antibiotics     Past Surgical History:  Procedure Laterality Date   APPENDECTOMY     COLONOSCOPY  2008   Dr Sonny Masters- repeat in 10 years   SKIN CANCER EXCISION     foot    Social History   Tobacco Use   Smoking status: Never   Smokeless tobacco: Never  Vaping Use   Vaping Use: Never used  Substance Use Topics   Alcohol use: No    Alcohol/week: 0.0 standard drinks of alcohol   Drug use: No     Medication list has been reviewed and updated.  Current Meds  Medication Sig   amLODipine (NORVASC) 5 MG tablet Take 1 tablet (5 mg total) by mouth daily.   atorvastatin (LIPITOR) 10 MG tablet Take 1 tablet (10 mg total) by mouth daily.   cetirizine (ZYRTEC) 10 MG tablet Take 1 tablet (10 mg total) by mouth daily.   etodolac (LODINE) 500 MG tablet Take 1 tablet by mouth twice daily   levothyroxine (SYNTHROID) 25 MCG tablet Take 1 tablet (25 mcg total) by mouth daily.   losartan (COZAAR) 50 MG tablet Take 1 tablet (50 mg total) by mouth daily.   montelukast (SINGULAIR) 10 MG tablet Take 1 tablet (10 mg total) by mouth at bedtime.   pantoprazole (PROTONIX) 40 MG tablet Take 1 tablet (40 mg total) by mouth daily.       03/25/2022   10:34 AM 09/21/2021    8:18 AM 06/21/2021    2:43 PM 06/02/2021    1:39 PM  GAD 7 : Generalized Anxiety Score  Nervous, Anxious, on Edge 0 0 0 0  Control/stop worrying 0 0 0 0  Worry too much - different things 0 0 0 0  Trouble relaxing 0 0 0 0  Restless 0 0 0 0  Easily annoyed or irritable 0 0 0 0  Afraid - awful might happen 0 0 0 0  Total GAD 7 Score 0 0 0 0  Anxiety Difficulty Not difficult at all Not difficult at all  Not difficult at all        03/25/2022   10:34 AM 10/25/2021    1:20 PM 09/21/2021    8:18 AM  Depression screen PHQ 2/9  Decreased Interest 0 0 0  Down, Depressed, Hopeless 0 0 0  PHQ - 2 Score 0 0 0  Altered sleeping 0  0  Tired, decreased energy 0  0  Change in appetite 0  0  Feeling bad or failure about yourself  0  0  Trouble concentrating 0  0  Moving slowly or fidgety/restless 0  0  Suicidal thoughts 0  0  PHQ-9 Score 0  0  Difficult doing work/chores Not difficult at all  Not difficult at all    BP Readings from Last 3 Encounters:  03/25/22 120/80  09/21/21 130/80  07/19/21 120/70    Physical Exam Vitals and nursing note reviewed. Exam conducted with a chaperone present.  Constitutional:      General: She is not in acute distress.    Appearance: She  is not diaphoretic.  HENT:     Head: Normocephalic and atraumatic.     Right Ear: External ear normal.     Left Ear: External ear normal.     Nose: Nose normal.  Eyes:     General:        Right eye: No discharge.        Left eye: No discharge.     Conjunctiva/sclera: Conjunctivae normal.     Pupils: Pupils are equal, round, and reactive to light.  Neck:     Thyroid: No thyromegaly.     Vascular: No JVD.  Cardiovascular:     Rate and Rhythm: Normal rate and regular rhythm.     Heart sounds: Normal heart sounds. No murmur heard.    No friction rub. No gallop.  Pulmonary:     Effort: Pulmonary effort is normal.     Breath sounds: Normal breath sounds.  Abdominal:     General: Bowel sounds are normal.     Palpations: Abdomen is soft. There is no mass.     Tenderness: There is no abdominal tenderness. There is no guarding.  Musculoskeletal:        General: Normal range of motion.     Cervical back: Normal range of motion and neck supple.  Lymphadenopathy:     Cervical: No cervical adenopathy.  Skin:    General: Skin is warm and dry.  Neurological:     Mental Status: She is alert.     Deep Tendon Reflexes: Reflexes are normal and  symmetric.     Wt Readings from Last 3 Encounters:  03/25/22 127 lb (57.6 kg)  09/21/21 123 lb (55.8 kg)  07/19/21 127 lb (57.6 kg)    BP 120/80   Pulse 88   Ht 5' 9"  (1.753 m)   Wt 127 lb (57.6 kg)   BMI 18.75 kg/m   Assessment and Plan:  1. Essential hypertension Chronic.  Controlled.  Stable.  Blood pressure is 120/80.  Continue amlodipine 5 mg once a day and losartan 50 mg once a day.  We will check renal function panel.  We will recheck in 6 months. - amLODipine (NORVASC) 5 MG tablet; Take 1 tablet (5 mg total) by mouth daily.  Dispense: 90 tablet; Refill: 1 - losartan (COZAAR) 50 MG tablet; Take 1 tablet (50 mg total) by mouth daily.  Dispense: 90 tablet; Refill: 1 - Renal Function Panel  2. Hypercholesterolemia Chronic.  Controlled.  Stable.  Continue atorvastatin 10 mg once a day.  Review of previous lipid panel is acceptable. - atorvastatin (LIPITOR) 10 MG tablet; Take 1 tablet (10 mg total) by mouth daily.  Dispense: 90 tablet; Refill: 1  3. 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  Controlled.  Stable.  Continue levothyroxine 25 mg once a day.  We will check TSH and if sufficient we will continue at current dosing. - levothyroxine (SYNTHROID) 25 MCG tablet; Take 1 tablet (25 mcg total) by mouth daily.  Dispense: 90 tablet; Refill: 1 - TSH  4. Chronic seasonal allergic rhinitis due to pollen Chronic.  Controlled.  Stable.  Continue montelukast 10 mg once a day. - montelukast (SINGULAIR) 10 MG tablet; Take 1 tablet (10 mg total) by mouth at bedtime.  Dispense: 90 tablet; Refill: 1  5. Gastroesophageal reflux disease, unspecified whether esophagitis present Chronic.  Controlled.  Stable.  Continue pantoprazole 40 mg once a day. - pantoprazole (PROTONIX) 40 MG tablet; Take 1 tablet (40 mg total) by mouth daily.  Dispense: 90 tablet; Refill: 1  6. Foot pain, right Chronic.  Controlled.  Stable.  As-needed basis etodolac 500 mg twice a day. -  etodolac (LODINE) 500 MG tablet; Take 1 tablet (500 mg total) by mouth 2 (two) times daily.  Dispense: 90 tablet; Refill: 1  7. Need for immunization against influenza Discussed and administered - Flu Vaccine QUAD High Dose(Fluad)    Otilio Miu, MD

## 2022-03-26 LAB — RENAL FUNCTION PANEL
Albumin: 4.4 g/dL (ref 3.8–4.8)
BUN/Creatinine Ratio: 15 (ref 12–28)
BUN: 10 mg/dL (ref 8–27)
CO2: 24 mmol/L (ref 20–29)
Calcium: 9.4 mg/dL (ref 8.7–10.3)
Chloride: 92 mmol/L — ABNORMAL LOW (ref 96–106)
Creatinine, Ser: 0.67 mg/dL (ref 0.57–1.00)
Glucose: 121 mg/dL — ABNORMAL HIGH (ref 70–99)
Phosphorus: 4.3 mg/dL (ref 3.0–4.3)
Potassium: 4.8 mmol/L (ref 3.5–5.2)
Sodium: 130 mmol/L — ABNORMAL LOW (ref 134–144)
eGFR: 91 mL/min/{1.73_m2} (ref >59)

## 2022-03-26 LAB — TSH: TSH: 1.8 u[IU]/mL (ref 0.450–4.500)

## 2022-05-18 DIAGNOSIS — H61002 Unspecified perichondritis of left external ear: Secondary | ICD-10-CM | POA: Diagnosis not present

## 2022-06-20 ENCOUNTER — Other Ambulatory Visit: Payer: Self-pay | Admitting: Family Medicine

## 2022-06-20 DIAGNOSIS — I10 Essential (primary) hypertension: Secondary | ICD-10-CM

## 2022-06-21 NOTE — Telephone Encounter (Signed)
Unable to refill per protocol, request is too soon. Last refill 03/25/22 for 90 and 1 refill. Will refuse.  Requested Prescriptions  Pending Prescriptions Disp Refills   amLODipine (NORVASC) 5 MG tablet [Pharmacy Med Name: amLODIPine Besylate 5 MG Oral Tablet] 90 tablet 0    Sig: Take 1 tablet by mouth once daily     Cardiovascular: Calcium Channel Blockers 2 Passed - 06/20/2022  9:44 AM      Passed - Last BP in normal range    BP Readings from Last 1 Encounters:  03/25/22 120/80         Passed - Last Heart Rate in normal range    Pulse Readings from Last 1 Encounters:  03/25/22 88         Passed - Valid encounter within last 6 months    Recent Outpatient Visits           2 months ago Essential hypertension   [facility] [place], Deanna C, MD   9 months ago Annual physical exam   [facility] [place], [place], MD   11 months ago Acute maxillary sinusitis, recurrence not specified   [facility] [place], Deanna C, MD   1 year ago Essential hypertension   [facility] [place], [place], MD   1 year ago Acute maxillary sinusitis, recurrence not specified   [facility], Jesse Sans, MD       Future Appointments             In 3 months Juline Patch, MD [facility], [facility]

## 2022-08-31 DIAGNOSIS — Z85828 Personal history of other malignant neoplasm of skin: Secondary | ICD-10-CM | POA: Diagnosis not present

## 2022-08-31 DIAGNOSIS — L570 Actinic keratosis: Secondary | ICD-10-CM | POA: Diagnosis not present

## 2022-08-31 DIAGNOSIS — L738 Other specified follicular disorders: Secondary | ICD-10-CM | POA: Diagnosis not present

## 2022-09-23 ENCOUNTER — Ambulatory Visit: Payer: Medicare Other | Admitting: Family Medicine

## 2022-09-23 ENCOUNTER — Ambulatory Visit (INDEPENDENT_AMBULATORY_CARE_PROVIDER_SITE_OTHER): Payer: Medicare Other | Admitting: Family Medicine

## 2022-09-23 ENCOUNTER — Encounter: Payer: Self-pay | Admitting: Family Medicine

## 2022-09-23 VITALS — BP 122/82 | HR 80 | Ht 69.0 in | Wt 125.0 lb

## 2022-09-23 DIAGNOSIS — I10 Essential (primary) hypertension: Secondary | ICD-10-CM | POA: Diagnosis not present

## 2022-09-23 DIAGNOSIS — J301 Allergic rhinitis due to pollen: Secondary | ICD-10-CM

## 2022-09-23 DIAGNOSIS — Z0000 Encounter for general adult medical examination without abnormal findings: Secondary | ICD-10-CM | POA: Diagnosis not present

## 2022-09-23 DIAGNOSIS — E032 Hypothyroidism due to medicaments and other exogenous substances: Secondary | ICD-10-CM

## 2022-09-23 DIAGNOSIS — E7800 Pure hypercholesterolemia, unspecified: Secondary | ICD-10-CM

## 2022-09-23 DIAGNOSIS — K219 Gastro-esophageal reflux disease without esophagitis: Secondary | ICD-10-CM

## 2022-09-23 MED ORDER — ATORVASTATIN CALCIUM 10 MG PO TABS: 10.0000 mg | ORAL_TABLET | Freq: Every day | ORAL | 1 refills | Status: DC

## 2022-09-23 MED ORDER — LOSARTAN POTASSIUM 50 MG PO TABS: 50.0000 mg | ORAL_TABLET | Freq: Every day | ORAL | 1 refills | Status: DC

## 2022-09-23 MED ORDER — AMLODIPINE BESYLATE 5 MG PO TABS: 5.0000 mg | ORAL_TABLET | Freq: Every day | ORAL | 1 refills | Status: DC

## 2022-09-23 MED ORDER — MONTELUKAST SODIUM 10 MG PO TABS: 10.0000 mg | ORAL_TABLET | Freq: Every day | ORAL | 1 refills | Status: DC

## 2022-09-23 MED ORDER — LEVOTHYROXINE SODIUM 25 MCG PO TABS: 25.0000 ug | ORAL_TABLET | Freq: Every day | ORAL | 1 refills | Status: DC

## 2022-09-23 MED ORDER — PANTOPRAZOLE SODIUM 40 MG PO TBEC: 40.0000 mg | DELAYED_RELEASE_TABLET | Freq: Every day | ORAL | 1 refills | Status: DC

## 2022-09-23 NOTE — Progress Notes (Signed)
Date:  09/23/2022   Name:  Victoria Rush   DOB:  Mar 22, 1946   MRN:  [id number]   Chief Complaint: No chief complaint on file.  Hypertension This is a chronic problem. The current episode started more than 1 year ago. The problem has been gradually improving since onset. The problem is controlled. Pertinent negatives include no anxiety, blurred vision, chest pain, headaches, malaise/fatigue, neck pain, orthopnea, palpitations, peripheral edema, PND, shortness of breath or sweats. There are no associated agents to hypertension. Past treatments include angiotensin blockers and calcium channel blockers. The current treatment provides moderate improvement. Identifiable causes of hypertension include a thyroid problem. There is no history of chronic renal disease.  Hyperlipidemia This is a chronic problem. The current episode started more than 1 year ago. The problem is controlled. Recent lipid tests were reviewed and are normal. She has no history of chronic renal disease, diabetes, hypothyroidism or liver disease. Pertinent negatives include no chest pain or shortness of breath. Current antihyperlipidemic treatment includes statins. The current treatment provides moderate improvement of lipids.  Thyroid Problem Presents for follow-up visit. Patient reports no diaphoresis, fatigue, hoarse voice, menstrual problem, palpitations or visual change. The symptoms have been stable. Her past medical history is significant for hyperlipidemia. There is no history of diabetes.  Gastroesophageal Reflux She reports no abdominal pain, no chest pain, no choking, no coughing, no dysphagia, no heartburn or no hoarse voice. This is a chronic problem. The current episode started more than 1 year ago. The problem has been gradually improving. Pertinent negatives include no fatigue.    Lab Results  Component Value Date   NA 130 (L) 03/25/2022   K 4.8 03/25/2022   CO2 24 03/25/2022   GLUCOSE 121 (H) 03/25/2022   BUN 10  03/25/2022   CREATININE 0.67 03/25/2022   CALCIUM 9.4 03/25/2022   EGFR 91 03/25/2022   GFRNONAA 86 06/12/2020   Lab Results  Component Value Date   CHOL 209 (H) 09/21/2021   HDL 61 09/21/2021   LDLCALC 132 (H) 09/21/2021   TRIG 89 09/21/2021   CHOLHDL 3.2 08/28/2018   Lab Results  Component Value Date   TSH 1.800 03/25/2022   No results found for: "HGBA1C" Lab Results  Component Value Date   WBC 4.9 09/21/2021   HGB 14.5 09/21/2021   HCT 42.5 09/21/2021   MCV 84 09/21/2021   PLT 227 09/21/2021   No results found for: "ALT", "AST", "GGT", "ALKPHOS", "BILITOT" No results found for: "25OHVITD2", "25OHVITD3", "VD25OH"   Review of Systems  Constitutional:  Negative for diaphoresis, fatigue and malaise/fatigue.  HENT:  Negative for congestion and hoarse voice.   Eyes:  Negative for blurred vision.  Respiratory:  Negative for cough, choking and shortness of breath.   Cardiovascular:  Negative for chest pain, palpitations, orthopnea and PND.  Gastrointestinal:  Negative for abdominal pain, blood in stool, dysphagia and heartburn.  Endocrine: Negative for polydipsia and polyuria.  Genitourinary:  Negative for difficulty urinating and menstrual problem.  Musculoskeletal:  Negative for neck pain.  Neurological:  Negative for dizziness and headaches.    Patient Active Problem List   Diagnosis Date Noted   Esophageal reflux 02/12/2016   Essential hypertension 02/12/2016   Chronic seasonal allergic rhinitis due to pollen 12/04/2015   Hypothyroidism 01/30/2015   Hypercholesterolemia 01/30/2015    Allergies  Allergen Reactions   Sulfa Antibiotics     Past Surgical History:  Procedure Laterality Date   APPENDECTOMY  COLONOSCOPY  2008   Dr Sonny Masters- repeat in 10 years   SKIN CANCER EXCISION     foot    Social History   Tobacco Use   Smoking status: Never   Smokeless tobacco: Never  Vaping Use   Vaping Use: Never used  Substance Use Topics   Alcohol use: No     Alcohol/week: 0.0 standard drinks of alcohol   Drug use: No     Medication list has been reviewed and updated.  Current Meds  Medication Sig   amLODipine (NORVASC) 5 MG tablet Take 1 tablet (5 mg total) by mouth daily.   atorvastatin (LIPITOR) 10 MG tablet Take 1 tablet (10 mg total) by mouth daily.   cetirizine (ZYRTEC) 10 MG tablet Take 1 tablet (10 mg total) by mouth daily.   etodolac (LODINE) 500 MG tablet Take 1 tablet (500 mg total) by mouth 2 (two) times daily.   levothyroxine (SYNTHROID) 25 MCG tablet Take 1 tablet (25 mcg total) by mouth daily.   losartan (COZAAR) 50 MG tablet Take 1 tablet (50 mg total) by mouth daily.   montelukast (SINGULAIR) 10 MG tablet Take 1 tablet (10 mg total) by mouth at bedtime.   pantoprazole (PROTONIX) 40 MG tablet Take 1 tablet (40 mg total) by mouth daily.       03/25/2022   10:34 AM 09/21/2021    8:18 AM 06/21/2021    2:43 PM 06/02/2021    1:39 PM  GAD 7 : Generalized Anxiety Score  Nervous, Anxious, on Edge 0 0 0 0  Control/stop worrying 0 0 0 0  Worry too much - different things 0 0 0 0  Trouble relaxing 0 0 0 0  Restless 0 0 0 0  Easily annoyed or irritable 0 0 0 0  Afraid - awful might happen 0 0 0 0  Total GAD 7 Score 0 0 0 0  Anxiety Difficulty Not difficult at all Not difficult at all  Not difficult at all       03/25/2022   10:34 AM 10/25/2021    1:20 PM 09/21/2021    8:18 AM  Depression screen PHQ 2/9  Decreased Interest 0 0 0  Down, Depressed, Hopeless 0 0 0  PHQ - 2 Score 0 0 0  Altered sleeping 0  0  Tired, decreased energy 0  0  Change in appetite 0  0  Feeling bad or failure about yourself  0  0  Trouble concentrating 0  0  Moving slowly or fidgety/restless 0  0  Suicidal thoughts 0  0  PHQ-9 Score 0  0  Difficult doing work/chores Not difficult at all  Not difficult at all    BP Readings from Last 3 Encounters:  09/23/22 122/82  03/25/22 120/80  09/21/21 130/80    Physical Exam Vitals and nursing note  reviewed. Exam conducted with a chaperone present.  Constitutional:      General: She is not in acute distress.    Appearance: Normal appearance. She is not diaphoretic.  HENT:     Head: Normocephalic and atraumatic.     Jaw: There is normal jaw occlusion.     Right Ear: Tympanic membrane and external ear normal. There is no impacted cerumen.     Left Ear: Tympanic membrane, ear canal and external ear normal. There is no impacted cerumen.     Nose: Nose normal. No congestion or rhinorrhea.     Mouth/Throat:     Mouth: Mucous membranes are  moist.     Dentition: No dental tenderness.     Palate: No mass.     Pharynx: Oropharynx is clear.  Eyes:     General: Lids are normal. Vision grossly intact. Gaze aligned appropriately.        Right eye: No discharge.        Left eye: No discharge.     Extraocular Movements: Extraocular movements intact.     Conjunctiva/sclera: Conjunctivae normal.     Pupils: Pupils are equal, round, and reactive to light.  Neck:     Thyroid: No thyroid mass, thyromegaly or thyroid tenderness.     Vascular: Normal carotid pulses. No carotid bruit, hepatojugular reflux or JVD.     Trachea: Trachea and phonation normal.  Cardiovascular:     Rate and Rhythm: Normal rate and regular rhythm.     Chest Wall: PMI is not displaced.     Pulses: Normal pulses. No decreased pulses.     Heart sounds: Normal heart sounds, S1 normal and S2 normal. No murmur heard.    No systolic murmur is present.     No diastolic murmur is present.     No friction rub. No gallop. No S3 or S4 sounds.  Pulmonary:     Effort: Pulmonary effort is normal.     Breath sounds: Normal breath sounds. No decreased breath sounds, wheezing, rhonchi or rales.  Chest:  Breasts:    Right: No swelling, bleeding, inverted nipple, mass, nipple discharge, skin change or tenderness.     Left: No swelling, bleeding, inverted nipple, mass, nipple discharge, skin change or tenderness.  Abdominal:      General: Bowel sounds are normal.     Palpations: Abdomen is soft. There is no hepatomegaly, splenomegaly or mass.     Tenderness: There is no abdominal tenderness. There is no guarding.  Musculoskeletal:        General: Normal range of motion.     Cervical back: Normal range of motion and neck supple.     Right lower leg: No edema.     Left lower leg: No edema.  Lymphadenopathy:     Head:     Right side of head: No submental or submandibular adenopathy.     Left side of head: No submental or submandibular adenopathy.     Cervical: No cervical adenopathy.     Right cervical: No superficial, deep or posterior cervical adenopathy.    Left cervical: No superficial, deep or posterior cervical adenopathy.     Upper Body:     Right upper body: No supraclavicular or axillary adenopathy.     Left upper body: No supraclavicular or axillary adenopathy.     Lower Body: No right inguinal adenopathy. No left inguinal adenopathy.  Skin:    General: Skin is warm and dry.     Capillary Refill: Capillary refill takes less than 2 seconds.  Neurological:     Mental Status: She is alert.     Cranial Nerves: Cranial nerves 2-12 are intact.     Sensory: Sensation is intact.     Motor: Motor function is intact.     Deep Tendon Reflexes: Reflexes are normal and symmetric.  Psychiatric:        Behavior: Behavior is cooperative.     Wt Readings from Last 3 Encounters:  09/23/22 125 lb (56.7 kg)  03/25/22 127 lb (57.6 kg)  09/21/21 123 lb (55.8 kg)    BP 122/82   Pulse 80   Ht  5\' 9"  (1.753 m)   Wt 125 lb (56.7 kg)   SpO2 97%   BMI 18.46 kg/m   Assessment and Plan:  1. Annual physical exam No subjective/objective concerns noted during history of present illness, review of past medical history/meds/labs, review of systems, and physical exam.  Will obtain lipid panel CBC and CMP resolved surveillance. - Lipid Panel With LDL/HDL Ratio - CBC w/Diff/Platelet - Comprehensive Metabolic Panel  (CMET)  2. Essential hypertension .  Controlled.  Stable.  Blood pressure today 122/82.  Asymptomatic.  Tolerating medications well.  Blood pressure 122/82 continue current dosings of amlodipine 5 mg once a day and losartan 50 mg once a day. - Comprehensive Metabolic Panel (CMET) - amLODipine (NORVASC) 5 MG tablet; Take 1 tablet (5 mg total) by mouth daily.  Dispense: 90 tablet; Refill: 1 - losartan (COZAAR) 50 MG tablet; Take 1 tablet (50 mg total) by mouth daily.  Dispense: 90 tablet; Refill: 1  3. Hypercholesterolemia Chronic.  Controlled.  Stable.  Along with dietary discretion for cholesterol and triglycerides patient takes atorvastatin 10 mg once a day. - atorvastatin (LIPITOR) 10 MG tablet; Take 1 tablet (10 mg total) by mouth daily.  Dispense: 90 tablet; Refill: 1  4. 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  Controlled.  Stable.  Continue levothyroxine 25 mcg daily. - levothyroxine (SYNTHROID) 25 MCG tablet; Take 1 tablet (25 mcg total) by mouth daily.  Dispense: 90 tablet; Refill: 1  5. Chronic seasonal allergic rhinitis due to pollen Continue Singulair along with over-the-counter none sedating antihistamine. - montelukast (SINGULAIR) 10 MG tablet; Take 1 tablet (10 mg total) by mouth at bedtime.  Dispense: 90 tablet; Refill: 1  6. Gastroesophageal reflux disease, unspecified whether esophagitis present Chronic.  Controlled.  Stable.  No dysphagia.  Continue pantoprazole 40 mg once a day.  Patient relates no blood in the stools and no melena. - pantoprazole (PROTONIX) 40 MG tablet; Take 1 tablet (40 mg total) by mouth daily.  Dispense: 90 tablet; Refill: 1    Otilio Miu, MD

## 2022-09-24 LAB — COMPREHENSIVE METABOLIC PANEL
ALT: 14 IU/L (ref 0–32)
AST: 16 IU/L (ref 0–40)
Albumin/Globulin Ratio: 1.9 (ref 1.2–2.2)
Albumin: 4.5 g/dL (ref 3.8–4.8)
Alkaline Phosphatase: 74 IU/L (ref 44–121)
BUN/Creatinine Ratio: 9 — ABNORMAL LOW (ref 12–28)
BUN: 7 mg/dL — ABNORMAL LOW (ref 8–27)
Bilirubin Total: 0.3 mg/dL (ref 0.0–1.2)
CO2: 23 mmol/L (ref 20–29)
Calcium: 9.5 mg/dL (ref 8.7–10.3)
Chloride: 97 mmol/L (ref 96–106)
Creatinine, Ser: 0.74 mg/dL (ref 0.57–1.00)
Globulin, Total: 2.4 g/dL (ref 1.5–4.5)
Glucose: 95 mg/dL (ref 70–99)
Potassium: 4.7 mmol/L (ref 3.5–5.2)
Sodium: 137 mmol/L (ref 134–144)
Total Protein: 6.9 g/dL (ref 6.0–8.5)
eGFR: 83 mL/min/{1.73_m2} (ref >59)

## 2022-09-24 LAB — CBC WITH DIFFERENTIAL/PLATELET
Basophils Absolute: 0 10*3/uL (ref 0.0–0.2)
Basos: 1 %
EOS (ABSOLUTE): 0 10*3/uL (ref 0.0–0.4)
Eos: 0 %
Hematocrit: 41.7 % (ref 34.0–46.6)
Hemoglobin: 14.5 g/dL (ref 11.1–15.9)
Immature Grans (Abs): 0 10*3/uL (ref 0.0–0.1)
Immature Granulocytes: 0 %
Lymphocytes Absolute: 1.4 10*3/uL (ref 0.7–3.1)
Lymphs: 22 %
MCH: 28.8 pg (ref 26.6–33.0)
MCHC: 34.8 g/dL (ref 31.5–35.7)
MCV: 83 fL (ref 79–97)
Monocytes Absolute: 0.7 10*3/uL (ref 0.1–0.9)
Monocytes: 12 %
Neutrophils Absolute: 4 10*3/uL (ref 1.4–7.0)
Neutrophils: 65 %
Platelets: 212 10*3/uL (ref 150–450)
RBC: 5.03 x10E6/uL (ref 3.77–5.28)
RDW: 12.6 % (ref 11.7–15.4)
WBC: 6.2 10*3/uL (ref 3.4–10.8)

## 2022-09-24 LAB — LIPID PANEL WITH LDL/HDL RATIO
Cholesterol, Total: 170 mg/dL (ref 100–199)
HDL: 62 mg/dL (ref >39)
LDL Chol Calc (NIH): 91 mg/dL (ref 0–99)
LDL/HDL Ratio: 1.5 ratio (ref 0.0–3.2)
Triglycerides: 90 mg/dL (ref 0–149)
VLDL Cholesterol Cal: 17 mg/dL (ref 5–40)

## 2022-09-26 NOTE — Progress Notes (Signed)
PC to pt, discussed labs, pt voiced understanding.

## 2022-10-04 IMAGING — MG MM DIGITAL SCREENING BILAT W/ TOMO AND CAD
8 series · 9 of 24 positions shown · non-contrast
Comparison: Previous exam(s).

CLINICAL DATA: Screening.

EXAM:
DIGITAL SCREENING BILATERAL MAMMOGRAM WITH TOMOSYNTHESIS AND CAD
TECHNIQUE: Bilateral screening digital craniocaudal and mediolateral oblique
mammograms were obtained. Bilateral screening digital breast
tomosynthesis was performed. The images were evaluated with
computer-aided detection.

[R CC synth-2D]
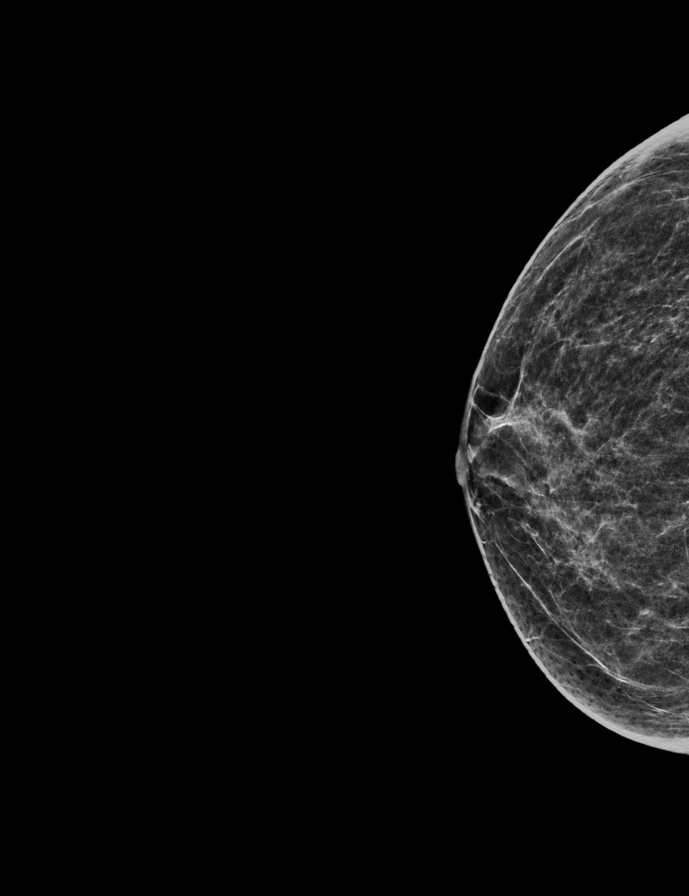

[R MLO synth-2D]
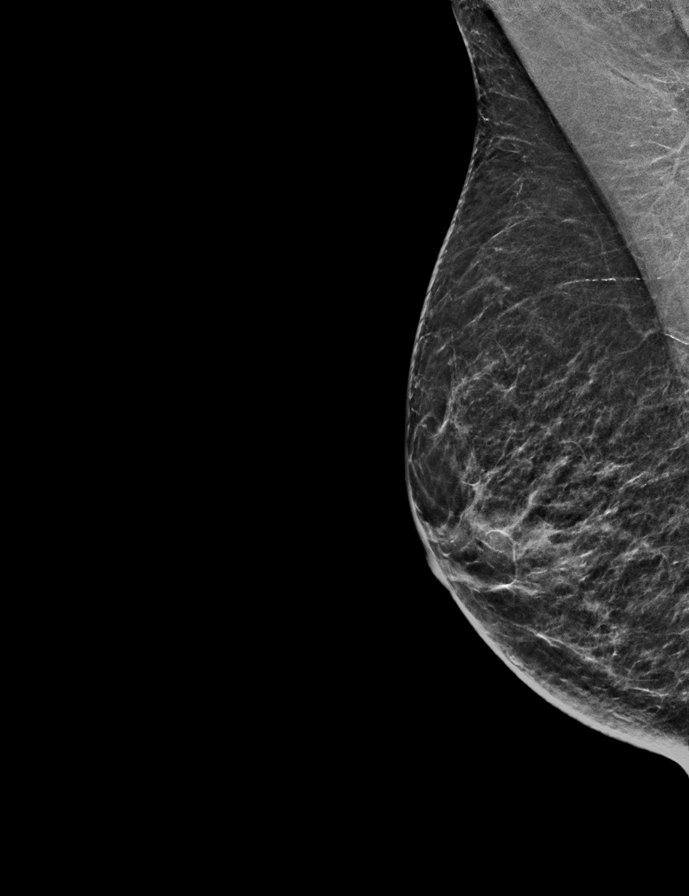

[L MLO synth-2D]
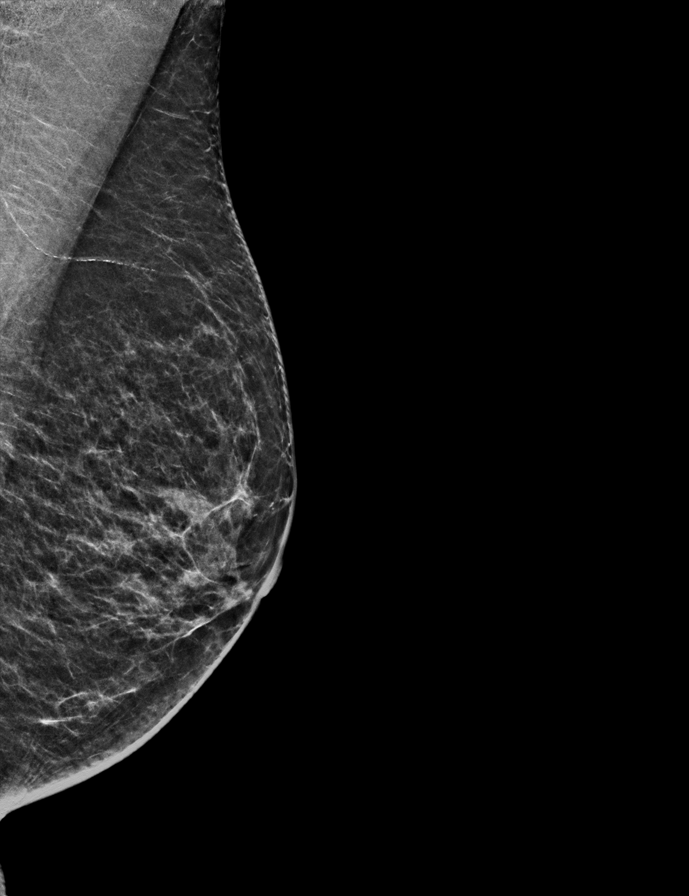

[L CC synth-2D]
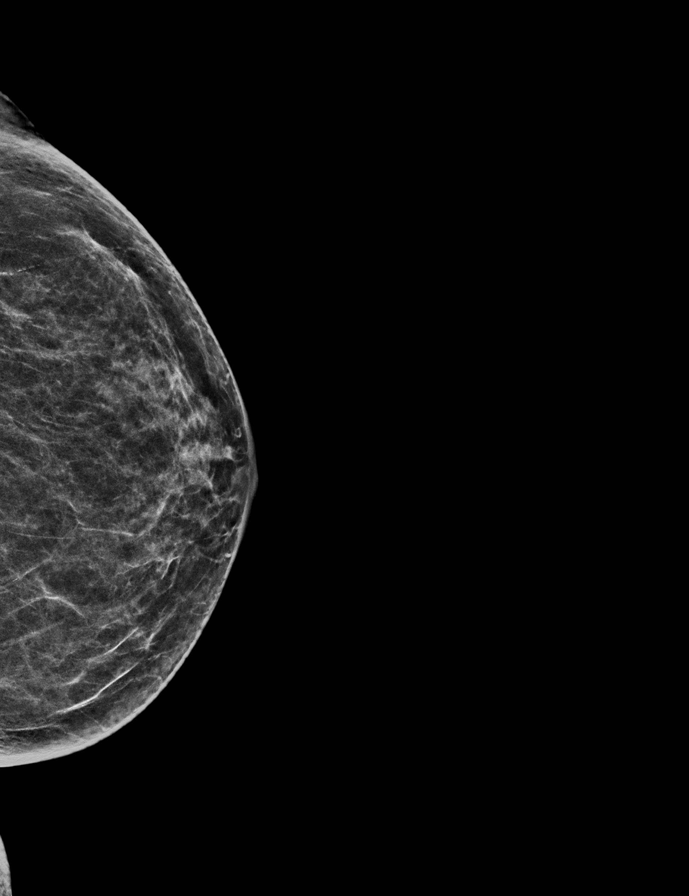

[R CC tomo · 2 of 50 frames shown]
[frame 17/50]
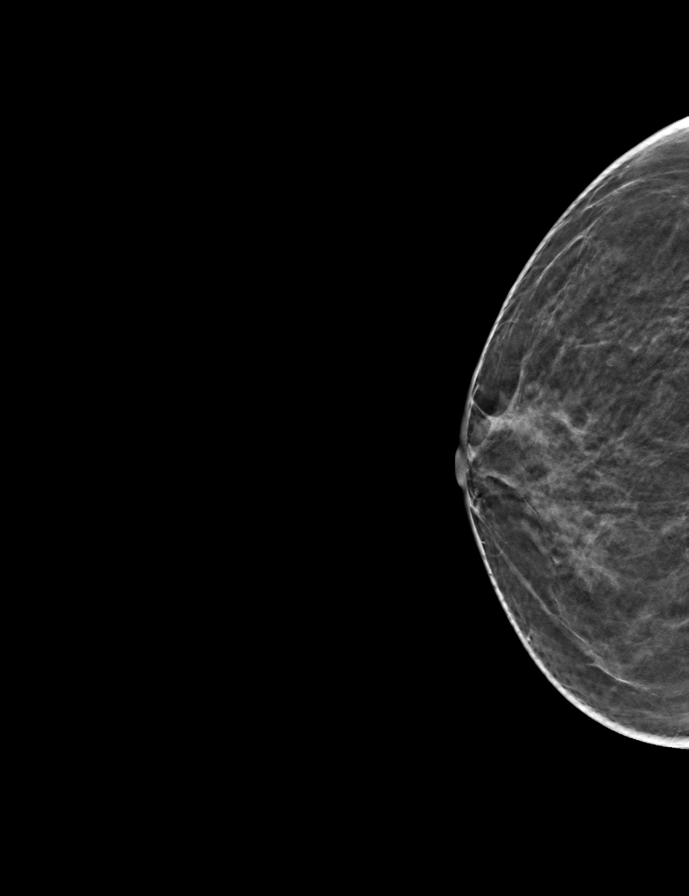
[frame 25/50]
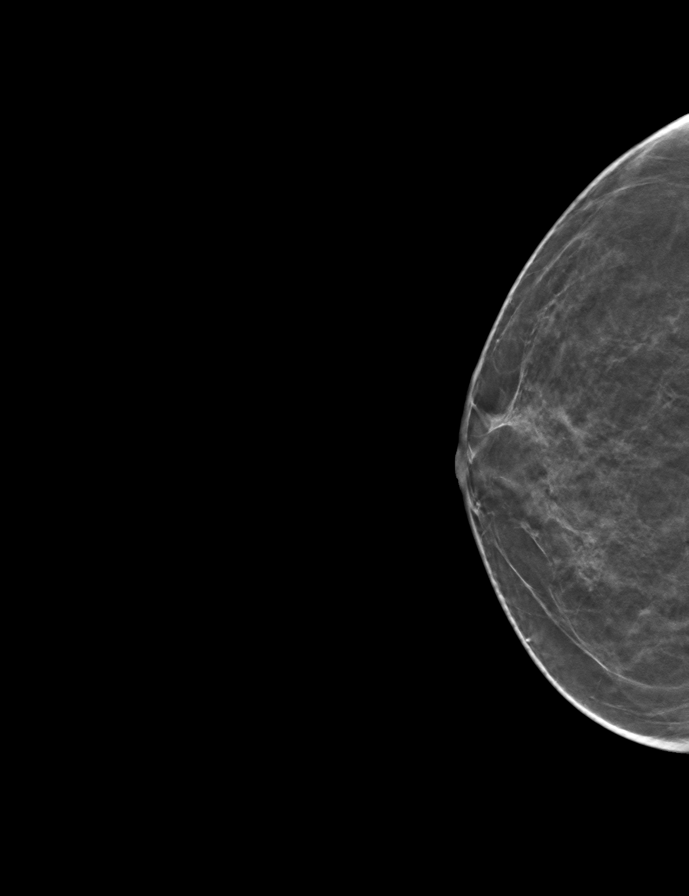

[L MLO tomo · tomo slice 25/49.0]
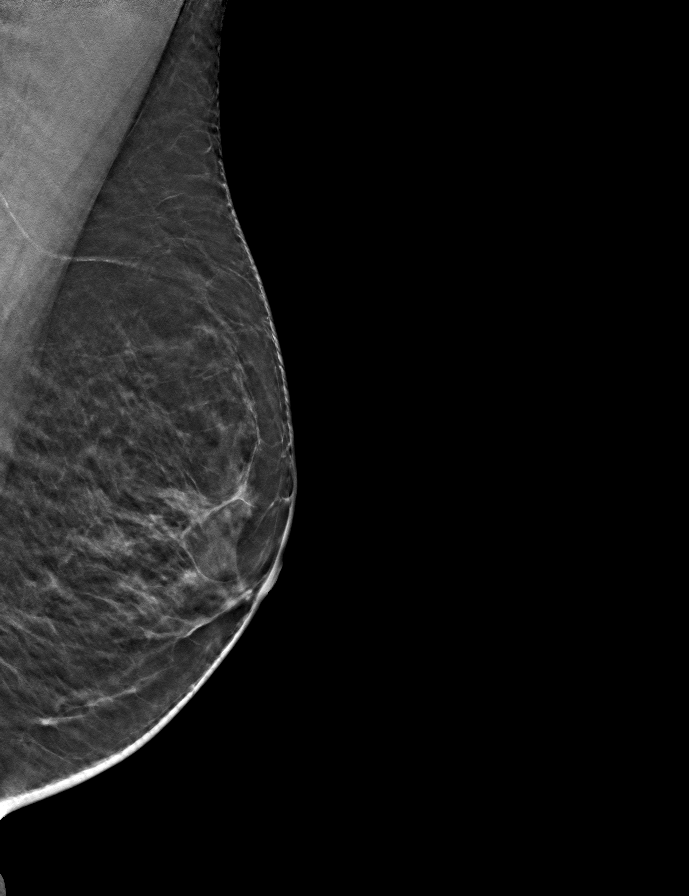

[R MLO tomo · tomo slice 25/50.0]
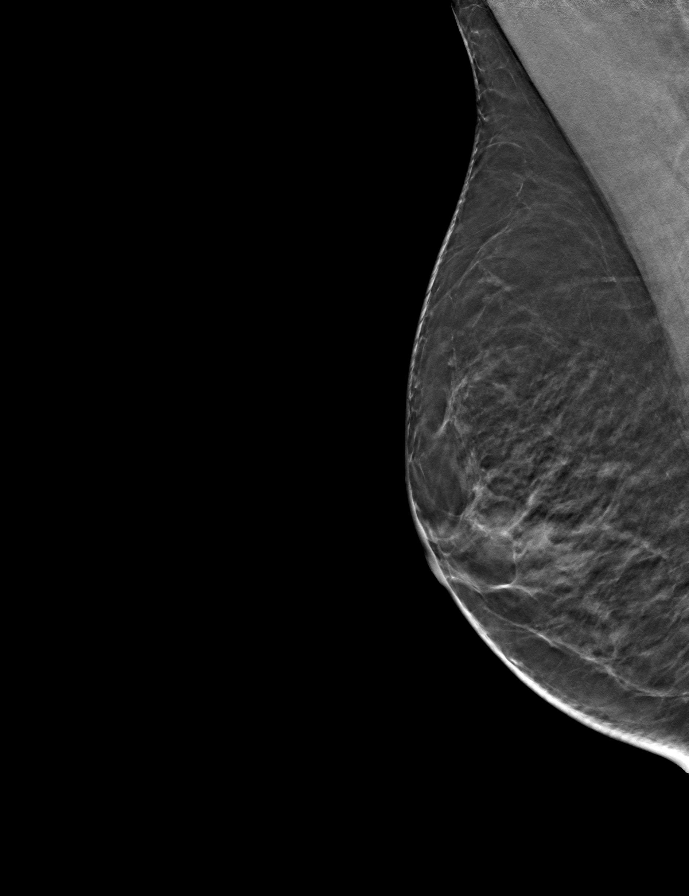

[L CC tomo · tomo slice 27/52.0]
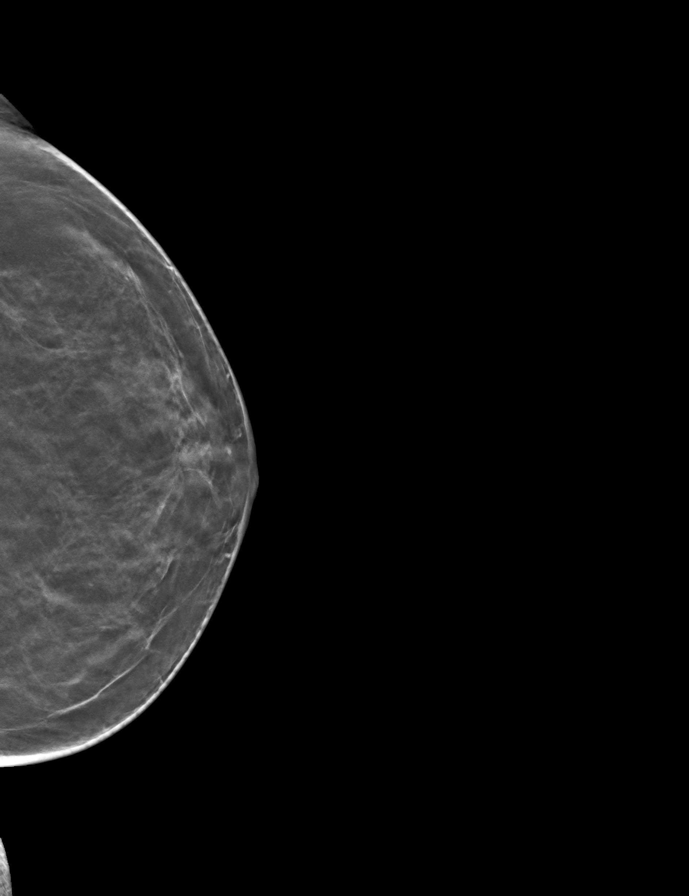

[9 of 24 positions shown; findings below may reference images not displayed]

ACR Breast Density Category b: There are scattered areas of
fibroglandular density.
FINDINGS: There are no findings suspicious for malignancy. The images were
evaluated with computer-aided detection.
IMPRESSION: No mammographic evidence of malignancy. A result letter of this
screening mammogram will be mailed directly to the patient.

RECOMMENDATION:
Screening mammogram in one year. (Code:WJ-I-BG6)

BI-RADS CATEGORY  1: Negative.

## 2023-01-19 ENCOUNTER — Telehealth: Payer: Self-pay | Admitting: Family Medicine

## 2023-01-19 NOTE — Telephone Encounter (Signed)
Copied from CRM [id number]. Topic: Medicare AWV >> Jan 19, 2023 11:12 AM Payton Doughty wrote: Reason for CRM: LM 01/19/2023 to schedule AWV   Verlee Rossetti; Care Guide Ambulatory Clinical Support [place] [facility] Direct Dial: [phone]

## 2023-02-06 ENCOUNTER — Telehealth: Payer: Self-pay | Admitting: Family Medicine

## 2023-02-06 NOTE — Telephone Encounter (Signed)
Copied from CRM [id number]. Topic: Medicare AWV >> Feb 06, 2023 10:22 AM Payton Doughty wrote: Reason for CRM: Called 02/06/2023 no voicemail 0/11/2022  Verlee Rossetti; Care Guide Ambulatory Clinical Support [place] [facility] Direct Dial: [phone]

## 2023-03-28 ENCOUNTER — Ambulatory Visit (INDEPENDENT_AMBULATORY_CARE_PROVIDER_SITE_OTHER): Payer: Medicare Other | Admitting: Family Medicine

## 2023-03-28 ENCOUNTER — Encounter: Payer: Self-pay | Admitting: Family Medicine

## 2023-03-28 VITALS — BP 120/78 | HR 68 | Ht 69.0 in | Wt 122.0 lb

## 2023-03-28 DIAGNOSIS — E7800 Pure hypercholesterolemia, unspecified: Secondary | ICD-10-CM

## 2023-03-28 DIAGNOSIS — K219 Gastro-esophageal reflux disease without esophagitis: Secondary | ICD-10-CM | POA: Diagnosis not present

## 2023-03-28 DIAGNOSIS — Z23 Encounter for immunization: Secondary | ICD-10-CM

## 2023-03-28 DIAGNOSIS — E032 Hypothyroidism due to medicaments and other exogenous substances: Secondary | ICD-10-CM

## 2023-03-28 DIAGNOSIS — I10 Essential (primary) hypertension: Secondary | ICD-10-CM

## 2023-03-28 MED ORDER — AMLODIPINE BESYLATE 5 MG PO TABS: 5.0000 mg | ORAL_TABLET | Freq: Every day | ORAL | 1 refills | Status: DC

## 2023-03-28 MED ORDER — ATORVASTATIN CALCIUM 10 MG PO TABS: 10.0000 mg | ORAL_TABLET | Freq: Every day | ORAL | 1 refills | Status: DC

## 2023-03-28 MED ORDER — LOSARTAN POTASSIUM 50 MG PO TABS: 50.0000 mg | ORAL_TABLET | Freq: Every day | ORAL | 1 refills | Status: DC

## 2023-03-28 MED ORDER — PANTOPRAZOLE SODIUM 40 MG PO TBEC: 40.0000 mg | DELAYED_RELEASE_TABLET | Freq: Every day | ORAL | 1 refills | Status: DC

## 2023-03-28 MED ORDER — LEVOTHYROXINE SODIUM 25 MCG PO TABS: 25.0000 ug | ORAL_TABLET | Freq: Every day | ORAL | 1 refills | Status: DC

## 2023-03-28 NOTE — Progress Notes (Signed)
Date:  03/28/2023   Name:  Victoria Rush   DOB:  Nov 08, 1945   MRN:  [id number]   Chief Complaint: Gastroesophageal Reflux, Hypertension, Hypothyroidism, and Hyperlipidemia  Gastroesophageal Reflux She reports no abdominal pain, no chest pain, no dysphagia, no heartburn, no hoarse voice, no nausea, no sore throat, no stridor or no wheezing. This is a chronic problem. The current episode started more than 1 year ago. The problem has been gradually improving. The symptoms are aggravated by certain foods. Pertinent negatives include no anemia, fatigue, melena, muscle weakness, orthopnea or weight loss. There are no known risk factors. She has tried a PPI for the symptoms. The treatment provided moderate relief.  Hypertension This is a chronic problem. The current episode started more than 1 year ago. The problem has been gradually improving since onset. The problem is controlled. Pertinent negatives include no blurred vision, chest pain, headaches, neck pain, orthopnea, palpitations, peripheral edema, PND, shortness of breath or sweats. Risk factors for coronary artery disease include dyslipidemia. Past treatments include calcium channel blockers and angiotensin blockers. The current treatment provides moderate improvement. There are no compliance problems.  There is no history of CAD/MI or CVA. Identifiable causes of hypertension include a thyroid problem. There is no history of chronic renal disease, a hypertension causing med or renovascular disease.  Hyperlipidemia This is a chronic problem. The current episode started more than 1 year ago. The problem is controlled. Recent lipid tests were reviewed and are normal. Exacerbating diseases include hypothyroidism. She has no history of chronic renal disease. Pertinent negatives include no chest pain or shortness of breath. Current antihyperlipidemic treatment includes statins. The current treatment provides moderate improvement of lipids. There are no  compliance problems.   Thyroid Problem Presents for follow-up visit. Patient reports no cold intolerance, diaphoresis, dry skin, fatigue, hair loss, heat intolerance, hoarse voice, nail problem, palpitations or weight loss. Her past medical history is significant for hyperlipidemia.    Lab Results  Component Value Date   NA 137 09/23/2022   K 4.7 09/23/2022   CO2 23 09/23/2022   GLUCOSE 95 09/23/2022   BUN 7 (L) 09/23/2022   CREATININE 0.74 09/23/2022   CALCIUM 9.5 09/23/2022   EGFR 83 09/23/2022   GFRNONAA 86 06/12/2020   Lab Results  Component Value Date   CHOL 170 09/23/2022   HDL 62 09/23/2022   LDLCALC 91 09/23/2022   TRIG 90 09/23/2022   CHOLHDL 3.2 08/28/2018   Lab Results  Component Value Date   TSH 1.800 03/25/2022   No results found for: "HGBA1C" Lab Results  Component Value Date   WBC 6.2 09/23/2022   HGB 14.5 09/23/2022   HCT 41.7 09/23/2022   MCV 83 09/23/2022   PLT 212 09/23/2022   Lab Results  Component Value Date   ALT 14 09/23/2022   AST 16 09/23/2022   ALKPHOS 74 09/23/2022   BILITOT 0.3 09/23/2022   No results found for: "25OHVITD2", "25OHVITD3", "VD25OH"   Review of Systems  Constitutional:  Negative for diaphoresis, fatigue and weight loss.  HENT:  Negative for hoarse voice, postnasal drip, rhinorrhea, sinus pressure, sinus pain, sneezing and sore throat.   Eyes:  Negative for blurred vision.  Respiratory:  Negative for shortness of breath and wheezing.   Cardiovascular:  Negative for chest pain, palpitations, orthopnea and PND.  Gastrointestinal:  Negative for abdominal pain, dysphagia, heartburn, melena and nausea.  Endocrine: Negative for cold intolerance, heat intolerance, polydipsia and polyuria.  Genitourinary:  Negative  for difficulty urinating.  Musculoskeletal:  Negative for muscle weakness and neck pain.  Neurological:  Negative for headaches.  Hematological:  Negative for adenopathy. Does not bruise/bleed easily.    Patient  Active Problem List   Diagnosis Date Noted   Esophageal reflux 02/12/2016   Essential hypertension 02/12/2016   Chronic seasonal allergic rhinitis due to pollen 12/04/2015   Hypothyroidism 01/30/2015   Hypercholesterolemia 01/30/2015    Allergies  Allergen Reactions   Sulfa Antibiotics     Past Surgical History:  Procedure Laterality Date   APPENDECTOMY     COLONOSCOPY  2008   Dr Henrene Hawking- repeat in 10 years   SKIN CANCER EXCISION     foot    Social History   Tobacco Use   Smoking status: Never   Smokeless tobacco: Never  Vaping Use   Vaping status: Never Used  Substance Use Topics   Alcohol use: No    Alcohol/week: 0.0 standard drinks of alcohol   Drug use: No     Medication list has been reviewed and updated.  Current Meds  Medication Sig   amLODipine (NORVASC) 5 MG tablet Take 1 tablet (5 mg total) by mouth daily.   atorvastatin (LIPITOR) 10 MG tablet Take 1 tablet (10 mg total) by mouth daily.   cetirizine (ZYRTEC) 10 MG tablet Take 1 tablet (10 mg total) by mouth daily.   etodolac (LODINE) 500 MG tablet Take 1 tablet (500 mg total) by mouth 2 (two) times daily.   levothyroxine (SYNTHROID) 25 MCG tablet Take 1 tablet (25 mcg total) by mouth daily.   losartan (COZAAR) 50 MG tablet Take 1 tablet (50 mg total) by mouth daily.   montelukast (SINGULAIR) 10 MG tablet Take 1 tablet (10 mg total) by mouth at bedtime.   pantoprazole (PROTONIX) 40 MG tablet Take 1 tablet (40 mg total) by mouth daily.       03/28/2023    1:19 PM 03/25/2022   10:34 AM 09/21/2021    8:18 AM 06/21/2021    2:43 PM  GAD 7 : Generalized Anxiety Score  Nervous, Anxious, on Edge 0 0 0 0  Control/stop worrying 0 0 0 0  Worry too much - different things 0 0 0 0  Trouble relaxing 0 0 0 0  Restless 0 0 0 0  Easily annoyed or irritable 0 0 0 0  Afraid - awful might happen 0 0 0 0  Total GAD 7 Score 0 0 0 0  Anxiety Difficulty Not difficult at all Not difficult at all Not difficult at all         03/28/2023    1:19 PM 03/25/2022   10:34 AM 10/25/2021    1:20 PM  Depression screen PHQ 2/9  Decreased Interest 0 0 0  Down, Depressed, Hopeless 0 0 0  PHQ - 2 Score 0 0 0  Altered sleeping 0 0   Tired, decreased energy 0 0   Change in appetite 0 0   Feeling bad or failure about yourself  0 0   Trouble concentrating 0 0   Moving slowly or fidgety/restless 0 0   Suicidal thoughts 0 0   PHQ-9 Score 0 0   Difficult doing work/chores Not difficult at all Not difficult at all     BP Readings from Last 3 Encounters:  03/28/23 120/78  09/23/22 122/82  03/25/22 120/80    Physical Exam Vitals and nursing note reviewed. Exam conducted with a chaperone present.  Constitutional:  General: She is not in acute distress.    Appearance: She is not diaphoretic.  HENT:     Head: Normocephalic and atraumatic.     Right Ear: Tympanic membrane and external ear normal.     Left Ear: Tympanic membrane and external ear normal.     Nose: Nose normal. No congestion or rhinorrhea.  Eyes:     General:        Right eye: No discharge.        Left eye: No discharge.     Conjunctiva/sclera: Conjunctivae normal.     Pupils: Pupils are equal, round, and reactive to light.  Neck:     Thyroid: No thyromegaly.     Vascular: No JVD.  Cardiovascular:     Rate and Rhythm: Normal rate and regular rhythm.     Heart sounds: Normal heart sounds. No murmur heard.    No friction rub. No gallop.  Pulmonary:     Effort: Pulmonary effort is normal.     Breath sounds: Normal breath sounds. No wheezing, rhonchi or rales.  Abdominal:     General: Bowel sounds are normal.     Palpations: Abdomen is soft. There is no mass.     Tenderness: There is no abdominal tenderness. There is no guarding.  Musculoskeletal:        General: Normal range of motion.     Cervical back: Normal range of motion and neck supple.  Lymphadenopathy:     Cervical: No cervical adenopathy.  Skin:    General: Skin is warm  and dry.  Neurological:     Mental Status: She is alert.     Deep Tendon Reflexes: Reflexes are normal and symmetric.     Wt Readings from Last 3 Encounters:  03/28/23 122 lb (55.3 kg)  09/23/22 125 lb (56.7 kg)  03/25/22 127 lb (57.6 kg)    BP 120/78   Pulse 68   Ht 5\' 9"  (1.753 m)   Wt 122 lb (55.3 kg)   SpO2 100%   BMI 18.02 kg/m   Assessment and Plan:  1. Essential hypertension Chronic.  Controlled.  Stable.  Blood pressure today 120/78.  Asymptomatic.  Tolerating medications well.  Blood pressure will continue to be controlled on losartan 50 mg once a day and amlodipine 5 mg once a day.  Will check renal function panel for electrolytes and GFR.  Will recheck in 6 months. - losartan (COZAAR) 50 MG tablet; Take 1 tablet (50 mg total) by mouth daily.  Dispense: 90 tablet; Refill: 1 - amLODipine (NORVASC) 5 MG tablet; Take 1 tablet (5 mg total) by mouth daily.  Dispense: 90 tablet; Refill: 1 - Renal Function Panel  2. Gastroesophageal reflux disease, unspecified whether esophagitis present Chronic.  Controlled.  Stable.  Continue pantoprazole 40 mg once a day.  Encourage patient not to eat the foods that seem to upset her meds which includes tomato sauce and chocolate. - pantoprazole (PROTONIX) 40 MG tablet; Take 1 tablet (40 mg total) by mouth daily.  Dispense: 90 tablet; Refill: 1  3. 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  Controlled.  Stable.  Patient doing well subjectively with current dosage of levothyroxine 25 mcg daily.  We will check thyroid panel with TSH and if necessary adjust accordingly.  Will recheck patient in 6 months. - levothyroxine (SYNTHROID) 25 MCG tablet; Take 1 tablet (25 mcg total) by mouth daily.  Dispense: 90 tablet; Refill: 1 - Thyroid Panel With TSH  4. Hypercholesterolemia  Chronic.  Controlled.  Stable.  Currently controlled lipids on atorvastatin 10 mg once a day.  We will hold on lipid panel and that previous reading  was acceptable and we will recheck in 6 months. - atorvastatin (LIPITOR) 10 MG tablet; Take 1 tablet (10 mg total) by mouth daily.  Dispense: 90 tablet; Refill: 1  5. Flu vaccine need Discussed and administered - Flu Vaccine Trivalent High Dose (Fluad)    Elizabeth Sauer, MD

## 2023-06-26 ENCOUNTER — Ambulatory Visit (INDEPENDENT_AMBULATORY_CARE_PROVIDER_SITE_OTHER): Payer: Medicare Other | Admitting: Family Medicine

## 2023-06-26 ENCOUNTER — Encounter: Payer: Self-pay | Admitting: Family Medicine

## 2023-06-26 VITALS — BP 124/74 | HR 85 | Temp 98.4°F | Ht 69.0 in | Wt 123.0 lb

## 2023-06-26 DIAGNOSIS — J301 Allergic rhinitis due to pollen: Secondary | ICD-10-CM

## 2023-06-26 DIAGNOSIS — J0100 Acute maxillary sinusitis, unspecified: Secondary | ICD-10-CM

## 2023-06-26 MED ORDER — AMOXICILLIN 500 MG PO CAPS
500.0000 mg | ORAL_CAPSULE | Freq: Three times a day (TID) | ORAL | 0 refills | Status: AC
Start: 2023-06-26 — End: 2023-07-06

## 2023-06-26 MED ORDER — MONTELUKAST SODIUM 10 MG PO TABS
10.0000 mg | ORAL_TABLET | Freq: Every day | ORAL | 1 refills | Status: DC
Start: 2023-06-26 — End: 2023-09-07

## 2023-06-26 NOTE — Progress Notes (Signed)
Date:  06/26/2023   Name:  Victoria Rush   DOB:  May 20, 1946   MRN:  [id number]   Chief Complaint: Headache and Sinus Problem (Pt in clinic today c/o possible sinus infection. Pt state she has year round allergies and she's been having symptoms for a while. Pt state she is also congested.)  Sinusitis This is a new problem. The current episode started in the past 7 days. The problem has been gradually worsening since onset. There has been no fever. Associated symptoms include congestion, headaches and sinus pressure. Pertinent negatives include no chills, coughing, diaphoresis, ear pain, hoarse voice, neck pain, shortness of breath, sneezing, sore throat or swollen glands. (Postnasal drainage) Treatments tried: zyrtec. The treatment provided mild relief.    Lab Results  Component Value Date   NA 137 09/23/2022   K 4.7 09/23/2022   CO2 23 09/23/2022   GLUCOSE 95 09/23/2022   BUN 7 (L) 09/23/2022   CREATININE 0.74 09/23/2022   CALCIUM 9.5 09/23/2022   EGFR 83 09/23/2022   GFRNONAA 86 06/12/2020   Lab Results  Component Value Date   CHOL 170 09/23/2022   HDL 62 09/23/2022   LDLCALC 91 09/23/2022   TRIG 90 09/23/2022   CHOLHDL 3.2 08/28/2018   Lab Results  Component Value Date   TSH 1.800 03/25/2022   No results found for: "HGBA1C" Lab Results  Component Value Date   WBC 6.2 09/23/2022   HGB 14.5 09/23/2022   HCT 41.7 09/23/2022   MCV 83 09/23/2022   PLT 212 09/23/2022   Lab Results  Component Value Date   ALT 14 09/23/2022   AST 16 09/23/2022   ALKPHOS 74 09/23/2022   BILITOT 0.3 09/23/2022   No results found for: "25OHVITD2", "25OHVITD3", "VD25OH"   Review of Systems  Constitutional:  Negative for chills and diaphoresis.  HENT:  Positive for congestion and sinus pressure. Negative for ear pain, hoarse voice, nosebleeds, postnasal drip, rhinorrhea, sneezing and sore throat.   Respiratory:  Negative for cough, chest tightness, shortness of breath and wheezing.    Cardiovascular:  Negative for chest pain, palpitations and leg swelling.  Musculoskeletal:  Negative for neck pain.  Neurological:  Positive for headaches.    Patient Active Problem List   Diagnosis Date Noted   Esophageal reflux 02/12/2016   Essential hypertension 02/12/2016   Chronic seasonal allergic rhinitis due to pollen 12/04/2015   Hypothyroidism 01/30/2015   Hypercholesterolemia 01/30/2015    Allergies  Allergen Reactions   Sulfa Antibiotics     Past Surgical History:  Procedure Laterality Date   APPENDECTOMY     COLONOSCOPY  2008   Dr Henrene Hawking- repeat in 10 years   SKIN CANCER EXCISION     foot    Social History   Tobacco Use   Smoking status: Never   Smokeless tobacco: Never  Vaping Use   Vaping status: Never Used  Substance Use Topics   Alcohol use: No    Alcohol/week: 0.0 standard drinks of alcohol   Drug use: No     Medication list has been reviewed and updated.  No outpatient medications have been marked as taking for the 06/26/23 encounter (Office Visit) with Duanne Limerick, MD.       06/26/2023   10:54 AM 03/28/2023    1:19 PM 03/25/2022   10:34 AM 09/21/2021    8:18 AM  GAD 7 : Generalized Anxiety Score  Nervous, Anxious, on Edge 0 0 0 0  Control/stop worrying 0  0 0 0  Worry too much - different things 0 0 0 0  Trouble relaxing 0 0 0 0  Restless 1 0 0 0  Easily annoyed or irritable 0 0 0 0  Afraid - awful might happen 0 0 0 0  Total GAD 7 Score 1 0 0 0  Anxiety Difficulty Not difficult at all Not difficult at all Not difficult at all Not difficult at all       06/26/2023   10:54 AM 03/28/2023    1:19 PM 03/25/2022   10:34 AM  Depression screen PHQ 2/9  Decreased Interest 0 0 0  Down, Depressed, Hopeless 0 0 0  PHQ - 2 Score 0 0 0  Altered sleeping 0 0 0  Tired, decreased energy 0 0 0  Change in appetite 1 0 0  Feeling bad or failure about yourself  0 0 0  Trouble concentrating 0 0 0  Moving slowly or fidgety/restless 0 0 0   Suicidal thoughts 0 0 0  PHQ-9 Score 1 0 0  Difficult doing work/chores Not difficult at all Not difficult at all Not difficult at all    BP Readings from Last 3 Encounters:  06/26/23 124/74  03/28/23 120/78  09/23/22 122/82    Physical Exam Vitals and nursing note reviewed. Exam conducted with a chaperone present.  Constitutional:      General: She is not in acute distress.    Appearance: She is not diaphoretic.  HENT:     Head: Normocephalic and atraumatic.     Jaw: There is normal jaw occlusion.     Right Ear: Hearing, tympanic membrane, ear canal and external ear normal.     Left Ear: Hearing, tympanic membrane, ear canal and external ear normal.     Nose: Nose normal.     Right Turbinates: Not enlarged, swollen or pale.     Left Turbinates: Not enlarged, swollen or pale.     Right Sinus: No frontal sinus tenderness.     Left Sinus: No frontal sinus tenderness.     Mouth/Throat:     Lips: Pink.     Mouth: Mucous membranes are moist.     Dentition: Normal dentition.     Tongue: No lesions. Tongue does not deviate from midline.     Palate: No mass and lesions.     Pharynx: Oropharynx is clear. Uvula midline.  Eyes:     General:        Right eye: No discharge.        Left eye: No discharge.     Conjunctiva/sclera: Conjunctivae normal.     Pupils: Pupils are equal, round, and reactive to light.  Neck:     Thyroid: No thyromegaly.     Vascular: No JVD.  Cardiovascular:     Rate and Rhythm: Normal rate and regular rhythm.     Heart sounds: Normal heart sounds. No murmur heard.    No friction rub. No gallop.  Pulmonary:     Effort: Pulmonary effort is normal.     Breath sounds: Normal breath sounds.  Abdominal:     General: Bowel sounds are normal.     Palpations: Abdomen is soft. There is no mass.     Tenderness: There is no abdominal tenderness. There is no guarding.  Musculoskeletal:        General: Normal range of motion.     Cervical back: Normal range of  motion and neck supple.  Lymphadenopathy:  Cervical: No cervical adenopathy.  Skin:    General: Skin is warm and dry.  Neurological:     Mental Status: She is alert.     Deep Tendon Reflexes: Reflexes are normal and symmetric.     Wt Readings from Last 3 Encounters:  06/26/23 123 lb (55.8 kg)  03/28/23 122 lb (55.3 kg)  09/23/22 125 lb (56.7 kg)    BP 124/74   Pulse 85   Temp 98.4 F (36.9 C)   Ht 5\' 9"  (1.753 m)   Wt 123 lb (55.8 kg)   SpO2 98%   BMI 18.16 kg/m   Assessment and Plan:  1. Acute maxillary sinusitis, recurrence not specified (Primary) New onset.  Persistent.  Stable.  Previously treated as a bronchitis upper respiratory may be of a seasonal allergy with Zyrtec we will resume the montelukast at 10 mg once a day and since she is having some purulent drainage and she is tender over the maxillary and frontal sinuses bilateral we will treat with amoxicillin 500 mg 3 times a day for 10 days. - amoxicillin (AMOXIL) 500 MG capsule; Take 1 capsule (500 mg total) by mouth 3 (three) times daily for 10 days.  Dispense: 30 capsule; Refill: 0  2. Chronic seasonal allergic rhinitis due to pollen Chronic.  Episodic.  Relatively stable.  But given that the frost warnings and events have not totally subsided patient's rhinitis and congestion we will treat with continuance of her over-the-counter Zyrtec and initiate Singulair 10 mg once a day. - montelukast (SINGULAIR) 10 MG tablet; Take 1 tablet (10 mg total) by mouth at bedtime.  Dispense: 90 tablet; Refill: 1    Elizabeth Sauer, MD

## 2023-09-07 ENCOUNTER — Encounter: Payer: Self-pay | Admitting: Family Medicine

## 2023-09-07 ENCOUNTER — Other Ambulatory Visit: Payer: Self-pay

## 2023-09-07 ENCOUNTER — Ambulatory Visit: Admitting: Family Medicine

## 2023-09-07 ENCOUNTER — Encounter: Admitting: Family Medicine

## 2023-09-07 DIAGNOSIS — E7800 Pure hypercholesterolemia, unspecified: Secondary | ICD-10-CM

## 2023-09-07 DIAGNOSIS — I10 Essential (primary) hypertension: Secondary | ICD-10-CM

## 2023-09-07 DIAGNOSIS — K219 Gastro-esophageal reflux disease without esophagitis: Secondary | ICD-10-CM

## 2023-09-07 DIAGNOSIS — J301 Allergic rhinitis due to pollen: Secondary | ICD-10-CM

## 2023-09-07 DIAGNOSIS — E032 Hypothyroidism due to medicaments and other exogenous substances: Secondary | ICD-10-CM

## 2023-09-07 MED ORDER — PANTOPRAZOLE SODIUM 40 MG PO TBEC: 40.0000 mg | DELAYED_RELEASE_TABLET | Freq: Every day | ORAL | 0 refills | Status: DC

## 2023-09-07 MED ORDER — LEVOTHYROXINE SODIUM 25 MCG PO TABS: 25.0000 ug | ORAL_TABLET | Freq: Every day | ORAL | 0 refills | Status: DC

## 2023-09-07 MED ORDER — LOSARTAN POTASSIUM 50 MG PO TABS
50.0000 mg | ORAL_TABLET | Freq: Every day | ORAL | 0 refills | Status: DC
Start: 2023-09-07 — End: 2023-09-08

## 2023-09-07 MED ORDER — MONTELUKAST SODIUM 10 MG PO TABS: 10.0000 mg | ORAL_TABLET | Freq: Every day | ORAL | 0 refills | Status: DC

## 2023-09-07 MED ORDER — ATORVASTATIN CALCIUM 10 MG PO TABS: 10.0000 mg | ORAL_TABLET | Freq: Every day | ORAL | 0 refills | Status: DC

## 2023-09-07 MED ORDER — AMLODIPINE BESYLATE 5 MG PO TABS: 5.0000 mg | ORAL_TABLET | Freq: Every day | ORAL | 0 refills | Status: DC

## 2023-09-08 ENCOUNTER — Encounter: Payer: Self-pay | Admitting: Family Medicine

## 2023-09-08 ENCOUNTER — Ambulatory Visit (INDEPENDENT_AMBULATORY_CARE_PROVIDER_SITE_OTHER): Admitting: Family Medicine

## 2023-09-08 VITALS — BP 130/80 | HR 96 | Ht 69.0 in | Wt 119.8 lb

## 2023-09-08 DIAGNOSIS — E7800 Pure hypercholesterolemia, unspecified: Secondary | ICD-10-CM

## 2023-09-08 DIAGNOSIS — E032 Hypothyroidism due to medicaments and other exogenous substances: Secondary | ICD-10-CM

## 2023-09-08 DIAGNOSIS — I10 Essential (primary) hypertension: Secondary | ICD-10-CM | POA: Diagnosis not present

## 2023-09-08 DIAGNOSIS — J301 Allergic rhinitis due to pollen: Secondary | ICD-10-CM | POA: Diagnosis not present

## 2023-09-08 DIAGNOSIS — K219 Gastro-esophageal reflux disease without esophagitis: Secondary | ICD-10-CM

## 2023-09-08 DIAGNOSIS — H6123 Impacted cerumen, bilateral: Secondary | ICD-10-CM

## 2023-09-08 MED ORDER — LEVOTHYROXINE SODIUM 25 MCG PO TABS: 25.0000 ug | ORAL_TABLET | Freq: Every day | ORAL | 1 refills | Status: DC

## 2023-09-08 MED ORDER — AMLODIPINE BESYLATE 5 MG PO TABS: 5.0000 mg | ORAL_TABLET | Freq: Every day | ORAL | 1 refills | Status: DC

## 2023-09-08 MED ORDER — ATORVASTATIN CALCIUM 10 MG PO TABS: 10.0000 mg | ORAL_TABLET | Freq: Every day | ORAL | 1 refills | Status: DC

## 2023-09-08 MED ORDER — MONTELUKAST SODIUM 10 MG PO TABS: 10.0000 mg | ORAL_TABLET | Freq: Every day | ORAL | 1 refills | Status: DC

## 2023-09-08 MED ORDER — PANTOPRAZOLE SODIUM 40 MG PO TBEC
40.0000 mg | DELAYED_RELEASE_TABLET | Freq: Every day | ORAL | 1 refills | Status: DC
Start: 2023-09-08 — End: 2024-03-22

## 2023-09-08 MED ORDER — LOSARTAN POTASSIUM 50 MG PO TABS: 50.0000 mg | ORAL_TABLET | Freq: Every day | ORAL | 1 refills | Status: DC

## 2023-09-08 MED ORDER — CARBAMIDE PEROXIDE 6.5 % OT SOLN: 5.0000 [drp] | Freq: Two times a day (BID) | OTIC | 0 refills | Status: AC

## 2023-09-08 NOTE — Progress Notes (Signed)
 Date:  09/08/2023   Name:  Victoria Rush   DOB:  12-13-1945   MRN:  [id number]   Chief Complaint: Medical Management of Chronic Issues (Patient presents today for a medication refil. She needs refills on Amlodipine, atorvastatin, levothyroxine, losartan, pantoprazole, and singular, and cetrizine. )  Hypertension This is a chronic problem. The current episode started more than 1 year ago. The problem has been waxing and waning since onset. The problem is controlled. Pertinent negatives include no anxiety, blurred vision, chest pain, headaches, malaise/fatigue, neck pain, orthopnea, palpitations, peripheral edema, PND, shortness of breath or sweats. There are no associated agents to hypertension. There are no known risk factors for coronary artery disease. Past treatments include calcium channel blockers and angiotensin blockers. The current treatment provides moderate improvement. There are no compliance problems.  There is no history of angina, CAD/MI or CVA. Identifiable causes of hypertension include a thyroid problem. There is no history of chronic renal disease or a hypertension causing med.  Hyperlipidemia This is a chronic problem. The current episode started more than 1 year ago. The problem is controlled. Recent lipid tests were reviewed and are normal. Exacerbating diseases include hypothyroidism. She has no history of chronic renal disease or obesity. Pertinent negatives include no chest pain, focal weakness, leg pain, myalgias or shortness of breath. Current antihyperlipidemic treatment includes statins. The current treatment provides moderate improvement of lipids. There are no compliance problems.  Risk factors for coronary artery disease include dyslipidemia and hypertension.  Thyroid Problem Presents for follow-up visit. Symptoms include cold intolerance. Patient reports no anxiety, constipation, depressed mood, diaphoresis, diarrhea, dry skin, fatigue, hair loss, heat intolerance, hoarse  voice, leg swelling, menstrual problem, nail problem, palpitations, tremors, visual change, weight gain or weight loss. The symptoms have been stable. Her past medical history is significant for hyperlipidemia.  Gastroesophageal Reflux She reports no chest pain, no coughing, no dysphagia, no globus sensation, no heartburn, no hoarse voice, no nausea, no sore throat or no wheezing. This is a chronic problem. The current episode started more than 1 year ago. The problem has been gradually improving. The symptoms are aggravated by certain foods. Pertinent negatives include no fatigue or weight loss. She has tried a PPI for the symptoms. The treatment provided moderate relief.    Lab Results  Component Value Date   NA 137 09/23/2022   K 4.7 09/23/2022   CO2 23 09/23/2022   GLUCOSE 95 09/23/2022   BUN 7 (L) 09/23/2022   CREATININE 0.74 09/23/2022   CALCIUM 9.5 09/23/2022   EGFR 83 09/23/2022   GFRNONAA 86 06/12/2020   Lab Results  Component Value Date   CHOL 170 09/23/2022   HDL 62 09/23/2022   LDLCALC 91 09/23/2022   TRIG 90 09/23/2022   CHOLHDL 3.2 08/28/2018   Lab Results  Component Value Date   TSH 1.800 03/25/2022   No results found for: "HGBA1C" Lab Results  Component Value Date   WBC 6.2 09/23/2022   HGB 14.5 09/23/2022   HCT 41.7 09/23/2022   MCV 83 09/23/2022   PLT 212 09/23/2022   Lab Results  Component Value Date   ALT 14 09/23/2022   AST 16 09/23/2022   ALKPHOS 74 09/23/2022   BILITOT 0.3 09/23/2022   No results found for: "25OHVITD2", "25OHVITD3", "VD25OH"   Review of Systems  Constitutional:  Negative for diaphoresis, fatigue, malaise/fatigue, weight gain and weight loss.  HENT:  Positive for postnasal drip. Negative for hoarse voice and sore throat.  Eyes:  Negative for blurred vision.  Respiratory:  Negative for cough, chest tightness, shortness of breath and wheezing.   Cardiovascular:  Negative for chest pain, palpitations, orthopnea, leg swelling and  PND.  Gastrointestinal:  Negative for constipation, diarrhea, dysphagia, heartburn and nausea.  Endocrine: Positive for cold intolerance. Negative for heat intolerance, polydipsia and polyuria.  Genitourinary:  Negative for menstrual problem.  Musculoskeletal:  Negative for myalgias and neck pain.  Neurological:  Negative for tremors, focal weakness and headaches.  Psychiatric/Behavioral:  The patient is not nervous/anxious.     Patient Active Problem List   Diagnosis Date Noted   Esophageal reflux 02/12/2016   Essential hypertension 02/12/2016   Chronic seasonal allergic rhinitis due to pollen 12/04/2015   Hypothyroidism 01/30/2015   Hypercholesterolemia 01/30/2015    Allergies  Allergen Reactions   Sulfa Antibiotics     Past Surgical History:  Procedure Laterality Date   APPENDECTOMY     COLONOSCOPY  2008   Dr Henrene Hawking- repeat in 10 years   SKIN CANCER EXCISION     foot    Social History   Tobacco Use   Smoking status: Never   Smokeless tobacco: Never  Vaping Use   Vaping status: Never Used  Substance Use Topics   Alcohol use: No    Alcohol/week: 0.0 standard drinks of alcohol   Drug use: No     Medication list has been reviewed and updated.  Current Meds  Medication Sig   amLODipine (NORVASC) 5 MG tablet Take 1 tablet (5 mg total) by mouth daily.   atorvastatin (LIPITOR) 10 MG tablet Take 1 tablet (10 mg total) by mouth daily.   cetirizine (ZYRTEC) 10 MG tablet Take 1 tablet (10 mg total) by mouth daily.   levothyroxine (SYNTHROID) 25 MCG tablet Take 1 tablet (25 mcg total) by mouth daily.   losartan (COZAAR) 50 MG tablet Take 1 tablet (50 mg total) by mouth daily.   montelukast (SINGULAIR) 10 MG tablet Take 1 tablet (10 mg total) by mouth at bedtime.   pantoprazole (PROTONIX) 40 MG tablet Take 1 tablet (40 mg total) by mouth daily.       09/08/2023    8:11 AM 06/26/2023   10:54 AM 03/28/2023    1:19 PM 03/25/2022   10:34 AM  GAD 7 : Generalized Anxiety  Score  Nervous, Anxious, on Edge 0 0 0 0  Control/stop worrying 1 0 0 0  Worry too much - different things 1 0 0 0  Trouble relaxing 1 0 0 0  Restless 1 1 0 0  Easily annoyed or irritable 1 0 0 0  Afraid - awful might happen 0 0 0 0  Total GAD 7 Score 5 1 0 0  Anxiety Difficulty Somewhat difficult Not difficult at all Not difficult at all Not difficult at all       09/08/2023    8:11 AM 06/26/2023   10:54 AM 03/28/2023    1:19 PM  Depression screen PHQ 2/9  Decreased Interest 1 0 0  Down, Depressed, Hopeless 1 0 0  PHQ - 2 Score 2 0 0  Altered sleeping 1 0 0  Tired, decreased energy 0 0 0  Change in appetite 0 1 0  Feeling bad or failure about yourself  1 0 0  Trouble concentrating 1 0 0  Moving slowly or fidgety/restless 1 0 0  Suicidal thoughts 1 0 0  PHQ-9 Score 7 1 0  Difficult doing work/chores Somewhat difficult Not difficult at  all Not difficult at all    BP Readings from Last 3 Encounters:  09/08/23 130/80  06/26/23 124/74  03/28/23 120/78    Physical Exam Vitals and nursing note reviewed.  Constitutional:      General: She is not in acute distress.    Appearance: She is not diaphoretic.  HENT:     Head: Normocephalic and atraumatic.     Right Ear: External ear normal.     Left Ear: External ear normal.     Nose: Nose normal.  Eyes:     General:        Right eye: No discharge.        Left eye: No discharge.     Conjunctiva/sclera: Conjunctivae normal.     Pupils: Pupils are equal, round, and reactive to light.  Neck:     Thyroid: No thyromegaly.     Vascular: No JVD.  Cardiovascular:     Rate and Rhythm: Normal rate and regular rhythm.     Heart sounds: Normal heart sounds, S1 normal and S2 normal. No murmur heard.    No systolic murmur is present.     No diastolic murmur is present.     No friction rub. No gallop. No S3 or S4 sounds.  Pulmonary:     Effort: Pulmonary effort is normal.     Breath sounds: Normal breath sounds.  Abdominal:      General: Bowel sounds are normal.     Palpations: Abdomen is soft. There is no mass.     Tenderness: There is no abdominal tenderness. There is no guarding.  Musculoskeletal:     Cervical back: Normal range of motion and neck supple.  Lymphadenopathy:     Cervical: No cervical adenopathy.  Skin:    General: Skin is warm and dry.  Neurological:     Mental Status: She is alert.     Deep Tendon Reflexes: Reflexes are normal and symmetric.     Reflex Scores:      Patellar reflexes are 2+ on the right side and 2+ on the left side.    Wt Readings from Last 3 Encounters:  09/08/23 119 lb 12.8 oz (54.3 kg)  06/26/23 123 lb (55.8 kg)  03/28/23 122 lb (55.3 kg)    BP 130/80   Pulse 96   Ht 5\' 9"  (1.753 m)   Wt 119 lb 12.8 oz (54.3 kg)   SpO2 98%   BMI 17.69 kg/m   Assessment and Plan:  1. Essential hypertension (Primary) Chronic.  Controlled.  Stable.  130/80 asymptomatic.  Tolerating medications well.  Continue amlodipine 5 mg and losartan 50 mg once each will check CMP for electrolytes and GFR. - amLODipine (NORVASC) 5 MG tablet; Take 1 tablet (5 mg total) by mouth daily.  Dispense: 90 tablet; Refill: 1 - losartan (COZAAR) 50 MG tablet; Take 1 tablet (50 mg total) by mouth daily.  Dispense: 90 tablet; Refill: 1 - Comprehensive metabolic panel  2. Hypercholesterolemia Chronic.  Controlled.  Stable.  Asymptomatic.  Tolerating medication well.    No episodes of myalgias or muscle weakness.  Atorvastatin 10 mg once a day.  Will check lipid panel for current LDL control and CMP, for - atorvastatin (LIPITOR) 10 MG tablet; Take 1 tablet (10 mg total) by mouth daily.  Dispense: 90 tablet; Refill: 1 - Lipid Panel With LDL/HDL Ratio - Comprehensive metabolic panel  3. Chronic seasonal allergic rhinitis due to pollen Chronic.  Episodic.  This stable.  Continue Singulair  10 mg once a day. - montelukast (SINGULAIR) 10 MG tablet; Take 1 tablet (10 mg total) by mouth at bedtime.  Dispense: 90  tablet; Refill: 1  4. 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  Symptoms controlled.  Stable.  Continue levothyroxine 25 mcg daily.  Will check TSH for adequacy of - levothyroxine (SYNTHROID) 25 MCG tablet; Take 1 tablet (25 mcg total) by mouth daily.  Dispense: 90 tablet; Refill: 1 - TSH  5. Gastroesophageal reflux disease, unspecified whether esophagitis present Chronic.  Controlled..  Stable.  Controlled.  Controlled.  40 mg once a day. - pantoprazole (PROTONIX) 40 MG tablet; Take 1 tablet (40 mg total) by mouth daily.  Dispense: 90 tablet; Refill: 1  6. Bilateral impacted cerumen Patient wishes to use Debrox at home first.  If unable to remove we will either recheck and physical or refer to ENT   Elizabeth Sauer, MD

## 2023-09-09 LAB — COMPREHENSIVE METABOLIC PANEL
ALT: 11 IU/L (ref 0–32)
AST: 17 IU/L (ref 0–40)
Albumin: 4.4 g/dL (ref 3.8–4.8)
Alkaline Phosphatase: 84 IU/L (ref 44–121)
BUN/Creatinine Ratio: 14 (ref 12–28)
BUN: 11 mg/dL (ref 8–27)
Bilirubin Total: 0.2 mg/dL (ref 0.0–1.2)
CO2: 24 mmol/L (ref 20–29)
Calcium: 9.5 mg/dL (ref 8.7–10.3)
Chloride: 94 mmol/L — ABNORMAL LOW (ref 96–106)
Creatinine, Ser: 0.8 mg/dL (ref 0.57–1.00)
Globulin, Total: 2.5 g/dL (ref 1.5–4.5)
Glucose: 105 mg/dL — ABNORMAL HIGH (ref 70–99)
Potassium: 4.1 mmol/L (ref 3.5–5.2)
Sodium: 135 mmol/L (ref 134–144)
Total Protein: 6.9 g/dL (ref 6.0–8.5)
eGFR: 75 mL/min/{1.73_m2} (ref >59)

## 2023-09-09 LAB — LIPID PANEL WITH LDL/HDL RATIO
Cholesterol, Total: 132 mg/dL (ref 100–199)
HDL: 54 mg/dL (ref >39)
LDL Chol Calc (NIH): 62 mg/dL (ref 0–99)
LDL/HDL Ratio: 1.1 ratio (ref 0.0–3.2)
Triglycerides: 82 mg/dL (ref 0–149)
VLDL Cholesterol Cal: 16 mg/dL (ref 5–40)

## 2023-09-09 LAB — TSH: TSH: 2.93 u[IU]/mL (ref 0.450–4.500)

## 2023-09-12 ENCOUNTER — Encounter: Admitting: Family Medicine

## 2023-09-15 ENCOUNTER — Other Ambulatory Visit: Payer: Self-pay | Admitting: Family Medicine

## 2023-09-15 DIAGNOSIS — E032 Hypothyroidism due to medicaments and other exogenous substances: Secondary | ICD-10-CM

## 2023-09-15 NOTE — Telephone Encounter (Signed)
 Copied from CRM [id number]. Topic: Clinical - Medication Refill >> Sep 15, 2023  9:07 AM Elle L wrote: Most Recent Primary Care Visit:  Provider: Duanne Limerick  Department: [facility]  Visit Type: OFFICE VISIT  Date: 09/08/2023  Medication: levothyroxine (SYNTHROID) 25 MCG tablet  Has the patient contacted their pharmacy? Yes  Is this the correct pharmacy for this prescription? Yes If no, delete pharmacy and type the correct one.  This is the patient's preferred pharmacy:  [facility] [address], [place] - [address] [place] [address] Phone: [phone] Fax: [phone]  Has the prescription been filled recently? No  Is the patient out of the medication? No, a few days left.   Has the patient been seen for an appointment in the last year OR does the patient have an upcoming appointment? Yes  Can we respond through MyChart? No  Agent: Please be advised that Rx refills may take up to 3 business days. We ask that you follow-up with your pharmacy.

## 2023-09-15 NOTE — Telephone Encounter (Signed)
 Disp Refills Start End   levothyroxine (SYNTHROID) 25 MCG tablet 90 tablet 1 09/08/2023 --   Sig - Route: Take 1 tablet (25 mcg total) by mouth daily. - Oral   Sent to pharmacy as: levothyroxine (SYNTHROID) 25 MCG tablet   E-Prescribing Status: Receipt confirmed by pharmacy (09/08/2023  8:20 AM EST)    Requested Prescriptions  Pending Prescriptions Disp Refills   levothyroxine (SYNTHROID) 25 MCG tablet 90 tablet 1    Sig: Take 1 tablet (25 mcg total) by mouth daily.     Endocrinology:  Hypothyroid Agents Passed - 09/15/2023  4:21 PM      Passed - TSH in normal range and within 360 days    TSH  Date Value Ref Range Status  09/08/2023 2.930 0.450 - 4.500 uIU/mL Final         Passed - Valid encounter within last 12 months    Recent Outpatient Visits           2 months ago Acute maxillary sinusitis, recurrence not specified   [facility]   5 months ago Essential hypertension   [facility]   11 months ago Annual physical exam   [facility]   1 year ago Essential hypertension   [facility]   1 year ago Annual physical exam   [facility]       Future Appointments             In 1 week Duanne Limerick, MD [facility] at [facility], [facility]

## 2023-09-26 ENCOUNTER — Encounter: Payer: Self-pay | Admitting: Family Medicine

## 2023-09-26 ENCOUNTER — Ambulatory Visit (INDEPENDENT_AMBULATORY_CARE_PROVIDER_SITE_OTHER): Admitting: Family Medicine

## 2023-09-26 VITALS — BP 124/86 | HR 83 | Ht 69.0 in | Wt 119.0 lb

## 2023-09-26 DIAGNOSIS — I10 Essential (primary) hypertension: Secondary | ICD-10-CM | POA: Diagnosis not present

## 2023-09-26 DIAGNOSIS — E7800 Pure hypercholesterolemia, unspecified: Secondary | ICD-10-CM

## 2023-09-26 DIAGNOSIS — Z0000 Encounter for general adult medical examination without abnormal findings: Secondary | ICD-10-CM | POA: Diagnosis not present

## 2023-09-26 MED ORDER — AMLODIPINE BESYLATE 5 MG PO TABS: 5.0000 mg | ORAL_TABLET | Freq: Every day | ORAL | 1 refills | Status: DC

## 2023-09-26 MED ORDER — ATORVASTATIN CALCIUM 10 MG PO TABS
10.0000 mg | ORAL_TABLET | Freq: Every day | ORAL | 1 refills | Status: DC
Start: 2023-09-26 — End: 2024-03-22

## 2023-09-26 NOTE — Progress Notes (Signed)
 Date:  09/26/2023   Name:  Victoria Rush   DOB:  25-Jun-1946   MRN:  [id number]   Chief Complaint: Annual Exam  Patient is a 78 year old female who presents for a comprehensive physical exam. The patient reports the following problems: none. Health maintenance has been reviewed up to date.      Lab Results  Component Value Date   NA 135 09/08/2023   K 4.1 09/08/2023   CO2 24 09/08/2023   GLUCOSE 105 (H) 09/08/2023   BUN 11 09/08/2023   CREATININE 0.80 09/08/2023   CALCIUM 9.5 09/08/2023   EGFR 75 09/08/2023   GFRNONAA 86 06/12/2020   Lab Results  Component Value Date   CHOL 132 09/08/2023   HDL 54 09/08/2023   LDLCALC 62 09/08/2023   TRIG 82 09/08/2023   CHOLHDL 3.2 08/28/2018   Lab Results  Component Value Date   TSH 2.930 09/08/2023   No results found for: "HGBA1C" Lab Results  Component Value Date   WBC 6.2 09/23/2022   HGB 14.5 09/23/2022   HCT 41.7 09/23/2022   MCV 83 09/23/2022   PLT 212 09/23/2022   Lab Results  Component Value Date   ALT 11 09/08/2023   AST 17 09/08/2023   ALKPHOS 84 09/08/2023   BILITOT 0.2 09/08/2023   No results found for: "25OHVITD2", "25OHVITD3", "VD25OH"   Review of Systems  Constitutional: Negative.  Negative for chills, fatigue, fever and unexpected weight change.  HENT:  Negative for congestion, ear discharge, ear pain, rhinorrhea, sinus pressure, sneezing and sore throat.   Respiratory:  Negative for cough, shortness of breath, wheezing and stridor.   Gastrointestinal:  Negative for abdominal pain, blood in stool, constipation, diarrhea and nausea.  Genitourinary:  Negative for dysuria, flank pain, frequency, hematuria, urgency and vaginal discharge.  Musculoskeletal:  Negative for arthralgias, back pain and myalgias.  Skin:  Negative for rash.  Neurological:  Negative for dizziness, weakness and headaches.  Hematological:  Negative for adenopathy. Does not bruise/bleed easily.  Psychiatric/Behavioral:  Negative for  dysphoric mood. The patient is not nervous/anxious.     Patient Active Problem List   Diagnosis Date Noted   Esophageal reflux 02/12/2016   Essential hypertension 02/12/2016   Chronic seasonal allergic rhinitis due to pollen 12/04/2015   Hypothyroidism 01/30/2015   Hypercholesterolemia 01/30/2015    Allergies  Allergen Reactions   Sulfa Antibiotics     Past Surgical History:  Procedure Laterality Date   APPENDECTOMY     COLONOSCOPY  2008   Dr Henrene Hawking- repeat in 10 years   SKIN CANCER EXCISION     foot    Social History   Tobacco Use   Smoking status: Never   Smokeless tobacco: Never  Vaping Use   Vaping status: Never Used  Substance Use Topics   Alcohol use: No    Alcohol/week: 0.0 standard drinks of alcohol   Drug use: No     Medication list has been reviewed and updated.  Current Meds  Medication Sig   amLODipine (NORVASC) 5 MG tablet Take 1 tablet (5 mg total) by mouth daily.   atorvastatin (LIPITOR) 10 MG tablet Take 1 tablet (10 mg total) by mouth daily.   carbamide peroxide (DEBROX) 6.5 % OTIC solution Place 5 drops into both ears 2 (two) times daily.   cetirizine (ZYRTEC) 10 MG tablet Take 1 tablet (10 mg total) by mouth daily.   levothyroxine (SYNTHROID) 25 MCG tablet Take 1 tablet (25 mcg total)  by mouth daily.   losartan (COZAAR) 50 MG tablet Take 1 tablet (50 mg total) by mouth daily.   montelukast (SINGULAIR) 10 MG tablet Take 1 tablet (10 mg total) by mouth at bedtime.   pantoprazole (PROTONIX) 40 MG tablet Take 1 tablet (40 mg total) by mouth daily.       09/08/2023    8:11 AM 06/26/2023   10:54 AM 03/28/2023    1:19 PM 03/25/2022   10:34 AM  GAD 7 : Generalized Anxiety Score  Nervous, Anxious, on Edge 0 0 0 0  Control/stop worrying 1 0 0 0  Worry too much - different things 1 0 0 0  Trouble relaxing 1 0 0 0  Restless 1 1 0 0  Easily annoyed or irritable 1 0 0 0  Afraid - awful might happen 0 0 0 0  Total GAD 7 Score 5 1 0 0  Anxiety  Difficulty Somewhat difficult Not difficult at all Not difficult at all Not difficult at all       09/08/2023    8:11 AM 06/26/2023   10:54 AM 03/28/2023    1:19 PM  Depression screen PHQ 2/9  Decreased Interest 1 0 0  Down, Depressed, Hopeless 1 0 0  PHQ - 2 Score 2 0 0  Altered sleeping 1 0 0  Tired, decreased energy 0 0 0  Change in appetite 0 1 0  Feeling bad or failure about yourself  1 0 0  Trouble concentrating 1 0 0  Moving slowly or fidgety/restless 1 0 0  Suicidal thoughts 1 0 0  PHQ-9 Score 7 1 0  Difficult doing work/chores Somewhat difficult Not difficult at all Not difficult at all    BP Readings from Last 3 Encounters:  09/26/23 124/86  09/08/23 130/80  06/26/23 124/74    Physical Exam Vitals and nursing note reviewed.  Constitutional:      General: She is not in acute distress.    Appearance: She is not diaphoretic.  HENT:     Head: Normocephalic and atraumatic.     Jaw: There is normal jaw occlusion.     Right Ear: Tympanic membrane, ear canal and external ear normal. Decreased hearing noted.     Left Ear: Tympanic membrane, ear canal and external ear normal. Decreased hearing noted.     Nose: Nose normal.     Mouth/Throat:     Lips: Pink.     Mouth: Mucous membranes are moist.     Pharynx: Oropharynx is clear. Uvula midline.  Eyes:     General:        Right eye: No discharge.        Left eye: No discharge.     Conjunctiva/sclera:     Right eye: Right conjunctiva is injected.     Left eye: Left conjunctiva is injected.     Pupils: Pupils are equal, round, and reactive to light.  Neck:     Thyroid: No thyromegaly.     Vascular: Normal carotid pulses. No carotid bruit, hepatojugular reflux or JVD.     Trachea: Trachea and phonation normal.  Cardiovascular:     Rate and Rhythm: Normal rate and regular rhythm.     Pulses:          Radial pulses are 2+ on the right side and 2+ on the left side.     Heart sounds: Normal heart sounds, S1 normal and  S2 normal. No murmur heard.    No systolic murmur  is present.     No diastolic murmur is present.     No friction rub. No gallop. No S3 or S4 sounds.  Pulmonary:     Effort: Pulmonary effort is normal.     Breath sounds: Normal breath sounds. No decreased breath sounds, wheezing, rhonchi or rales.  Chest:  Breasts:    Right: No swelling, bleeding, inverted nipple, mass, nipple discharge, skin change or tenderness.     Left: No swelling, bleeding, inverted nipple, mass, nipple discharge, skin change or tenderness.  Abdominal:     General: Bowel sounds are normal.     Palpations: Abdomen is soft. There is no hepatomegaly, splenomegaly or mass.     Tenderness: There is no abdominal tenderness. There is no guarding.  Musculoskeletal:        General: Normal range of motion.     Cervical back: Normal range of motion and neck supple.     Right lower leg: No edema.     Left lower leg: No edema.  Lymphadenopathy:     Cervical: No cervical adenopathy.     Right cervical: No superficial or posterior cervical adenopathy.    Left cervical: No superficial, deep or posterior cervical adenopathy.  Skin:    General: Skin is warm and dry.     Capillary Refill: Capillary refill takes less than 2 seconds.  Neurological:     Mental Status: She is alert.     Cranial Nerves: Cranial nerves 2-12 are intact.     Sensory: Sensation is intact.     Motor: Motor function is intact.     Deep Tendon Reflexes: Reflexes are normal and symmetric.     Wt Readings from Last 3 Encounters:  09/08/23 119 lb 12.8 oz (54.3 kg)  06/26/23 123 lb (55.8 kg)  03/28/23 122 lb (55.3 kg)    BP 124/86   Pulse 83   Ht 5\' 9"  (1.753 m)   SpO2 96%   BMI 17.69 kg/m   Assessment and Plan:  .   Shannell Mikkelsen is a 78 y.o. female who presents today for her Complete Annual Exam. She feels well. She reports exercising walking. She reports she is sleeping well.  Immunizations are reviewed and recommendations provided.   Age  appropriate screening tests are discussed. Counseling given for risk factor reduction interventions.  1. Annual physical exam (Primary) No subjective/objective concerns noted during HPI, review of past medical history review of current medications review of within the past year labs, review of systems and physical exam.  Patient has had labs fairly recently and no further labs are required at this time but she does need refills on her amlodipine and atorvastatin.  2. Essential hypertension Chronic.  Controlled.  Stable.  Blood pressure 124/86.  Asymptomatic.  Tolerating medications well.  Will refill amlodipine 5 mg once a day. - amLODipine (NORVASC) 5 MG tablet; Take 1 tablet (5 mg total) by mouth daily.  Dispense: 90 tablet; Refill: 1  3. Hypercholesterolemia Chronic.  Controlled.  Stable.  Without muscle aches or muscle weakness.  Continue atorvastatin 10 mg once a day. - atorvastatin (LIPITOR) 10 MG tablet; Take 1 tablet (10 mg total) by mouth daily.  Dispense: 90 tablet; Refill: 1   Elizabeth Sauer, MD

## 2023-09-26 NOTE — Patient Instructions (Signed)

## 2023-10-09 ENCOUNTER — Ambulatory Visit (INDEPENDENT_AMBULATORY_CARE_PROVIDER_SITE_OTHER): Admitting: Family Medicine

## 2023-10-09 ENCOUNTER — Encounter: Payer: Self-pay | Admitting: Family Medicine

## 2023-10-09 ENCOUNTER — Ambulatory Visit: Payer: Self-pay

## 2023-10-09 VITALS — BP 138/88 | HR 96 | Ht 69.0 in | Wt 116.0 lb

## 2023-10-09 DIAGNOSIS — M1990 Unspecified osteoarthritis, unspecified site: Secondary | ICD-10-CM

## 2023-10-09 MED ORDER — MELOXICAM 7.5 MG PO TABS: 7.5000 mg | ORAL_TABLET | Freq: Every day | ORAL | 0 refills | Status: DC

## 2023-10-09 NOTE — Telephone Encounter (Signed)
 Copied from CRM [id number]. Topic: Clinical - Red Word Triage >> Oct 09, 2023  8:57 AM Alessandra Bevels wrote: Red Word that prompted transfer to Nurse Triage: Patient is calling to report that her right foot is painful with redness and swelling. Please advise   Chief Complaint: Right foot pain/swelling/redness Symptoms: pain, swelling, redness, feels hot Frequency: x 2 days Pertinent Negatives: Patient denies any known injuries, difficulty breathing, fever Disposition: [] ED /[] Urgent Care (no appt availability in office) / [x] Appointment(In office/virtual)/ []  [facility]/ [] Home Care/ [] Refused Recommended Disposition /[]  Mobile Bus/ []  Follow-up with PCP Additional Notes: Patient called and advised that her toes on her right foot are red and swollen with some redness.  She states that two days ago they started becoming painful.  She has taken ibuprofen and that helps. Patient denies any known injuries, having any fevers, or any difficulty breathing. Patient Care Advice given as per protocol and appointment made for patient's PCP Dr Elizabeth Sauer for today 47/2025 at 10:20 am.  Patient is also advised that if anything worsens to go to the Emergency Room for further evaluation.  Patient verbalized understanding.  Reason for Disposition  [1] Redness of the skin AND [2] no fever  Answer Assessment - Initial Assessment Questions 1. ONSET: "When did the pain start?"      2 days ago 2. LOCATION: "Where is the pain located?"      On right foot at her toes 3. PAIN: "How bad is the pain?"    (Scale 1-10; or mild, moderate, severe)  - MILD (1-3): doesn't interfere with normal activities.   - MODERATE (4-7): interferes with normal activities (e.g., work or school) or awakens from sleep, limping.   - SEVERE (8-10): excruciating pain, unable to do any normal activities, unable to walk.      3-4 4. WORK OR EXERCISE: "Has there been any recent work or exercise that involved this part of the  body?"      no 5. CAUSE: "What do you think is causing the foot pain?"     unsure 6. OTHER SYMPTOMS: "Do you have any other symptoms?" (e.g., leg pain, rash, fever, numbness)     Redness,  Protocols used: Foot Pain-A-AH

## 2023-10-09 NOTE — Progress Notes (Signed)
 Subjective:      Date:  10/09/2023   Name:  Victoria Rush   DOB:  [birth year]   MRN:  [id number]   Chief Complaint: Foot Injury (Redness and swelling started Saturday denies injury Ibuprofen helped)  Foot Injury  The incident occurred 2 days ago (for foot pain without injury). There was no injury mechanism. The pain is present in the right toes and right foot. The quality of the pain is described as aching. The pain is at a severity of 2/10 (4-5 Saturday). The pain is mild. The pain has been Fluctuating since onset. Pertinent negatives include no inability to bear weight, loss of motion, loss of sensation, muscle weakness, numbness or tingling. The symptoms are aggravated by palpation, weight bearing and movement. The treatment provided mild relief.    Lab Results  Component Value Date   NA 135 09/08/2023   K 4.1 09/08/2023   CO2 24 09/08/2023   GLUCOSE 105 (H) 09/08/2023   BUN 11 09/08/2023   CREATININE 0.80 09/08/2023   CALCIUM 9.5 09/08/2023   EGFR 75 09/08/2023   GFRNONAA 86 06/12/2020   Lab Results  Component Value Date   CHOL 132 09/08/2023   HDL 54 09/08/2023   LDLCALC 62 09/08/2023   TRIG 82 09/08/2023   CHOLHDL 3.2 08/28/2018   Lab Results  Component Value Date   TSH 2.930 09/08/2023   No results found for: "HGBA1C" Lab Results  Component Value Date   WBC 6.2 09/23/2022   HGB 14.5 09/23/2022   HCT 41.7 09/23/2022   MCV 83 09/23/2022   PLT 212 09/23/2022   Lab Results  Component Value Date   ALT 11 09/08/2023   AST 17 09/08/2023   ALKPHOS 84 09/08/2023   BILITOT 0.2 09/08/2023   No results found for: "25OHVITD2", "25OHVITD3", "VD25OH"   Review of Systems  Constitutional:  Negative for fever.  Respiratory:  Negative for shortness of breath and wheezing.   Cardiovascular:  Negative for chest pain and palpitations.  Musculoskeletal:  Positive for arthralgias. Negative for back pain, gait problem, joint swelling and myalgias.       Pain right 5th MP joint   Neurological:  Negative for tingling and numbness.    Patient Active Problem List   Diagnosis Date Noted   Esophageal reflux 02/12/2016   Essential hypertension 02/12/2016   Chronic seasonal allergic rhinitis due to pollen 12/04/2015   Hypothyroidism 01/30/2015   Hypercholesterolemia 01/30/2015    Allergies  Allergen Reactions   Sulfa Antibiotics     Past Surgical History:  Procedure Laterality Date   APPENDECTOMY     COLONOSCOPY  2008   Dr Henrene Hawking- repeat in 10 years   SKIN CANCER EXCISION     foot    Social History   Tobacco Use   Smoking status: Never   Smokeless tobacco: Never  Vaping Use   Vaping status: Never Used  Substance Use Topics   Alcohol use: No    Alcohol/week: 0.0 standard drinks of alcohol   Drug use: No     Medication list has been reviewed and updated.  Current Meds  Medication Sig   amLODipine (NORVASC) 5 MG tablet Take 1 tablet (5 mg total) by mouth daily.   atorvastatin (LIPITOR) 10 MG tablet Take 1 tablet (10 mg total) by mouth daily.   carbamide peroxide (DEBROX) 6.5 % OTIC solution Place 5 drops into both ears 2 (two) times daily.   cetirizine (ZYRTEC) 10 MG tablet Take 1 tablet (10 mg  total) by mouth daily.   levothyroxine (SYNTHROID) 25 MCG tablet Take 1 tablet (25 mcg total) by mouth daily.   losartan (COZAAR) 50 MG tablet Take 1 tablet (50 mg total) by mouth daily.   montelukast (SINGULAIR) 10 MG tablet Take 1 tablet (10 mg total) by mouth at bedtime.   pantoprazole (PROTONIX) 40 MG tablet Take 1 tablet (40 mg total) by mouth daily.       09/08/2023    8:11 AM 06/26/2023   10:54 AM 03/28/2023    1:19 PM 03/25/2022   10:34 AM  GAD 7 : Generalized Anxiety Score  Nervous, Anxious, on Edge 0 0 0 0  Control/stop worrying 1 0 0 0  Worry too much - different things 1 0 0 0  Trouble relaxing 1 0 0 0  Restless 1 1 0 0  Easily annoyed or irritable 1 0 0 0  Afraid - awful might happen 0 0 0 0  Total GAD 7 Score 5 1 0 0  Anxiety  Difficulty Somewhat difficult Not difficult at all Not difficult at all Not difficult at all       09/08/2023    8:11 AM 06/26/2023   10:54 AM 03/28/2023    1:19 PM  Depression screen PHQ 2/9  Decreased Interest 1 0 0  Down, Depressed, Hopeless 1 0 0  PHQ - 2 Score 2 0 0  Altered sleeping 1 0 0  Tired, decreased energy 0 0 0  Change in appetite 0 1 0  Feeling bad or failure about yourself  1 0 0  Trouble concentrating 1 0 0  Moving slowly or fidgety/restless 1 0 0  Suicidal thoughts 1 0 0  PHQ-9 Score 7 1 0  Difficult doing work/chores Somewhat difficult Not difficult at all Not difficult at all    BP Readings from Last 3 Encounters:  10/09/23 138/88  09/26/23 124/86  09/08/23 130/80    Physical Exam HENT:     Right Ear: Tympanic membrane normal.     Left Ear: Tympanic membrane normal.     Nose: Nose normal.  Cardiovascular:     Pulses: Normal pulses.          Dorsalis pedis pulses are 2+ on the right side.       Posterior tibial pulses are 2+ on the right side.     Heart sounds: No murmur heard.    No friction rub. No gallop.  Pulmonary:     Breath sounds: No wheezing, rhonchi or rales.  Abdominal:     General: Abdomen is flat.  Musculoskeletal:     Right foot: Tenderness present.     Comments: Tender 5th MP joint  Feet:     Right foot:     Skin integrity: Skin integrity normal. No ulcer, blister, skin breakdown, erythema, warmth, callus, dry skin or fissure.     Toenail Condition: Right toenails are normal.  Neurological:     Mental Status: She is alert.     Wt Readings from Last 3 Encounters:  10/09/23 116 lb (52.6 kg)  09/26/23 119 lb (54 kg)  09/08/23 119 lb 12.8 oz (54.3 kg)    BP 138/88   Pulse 96   Ht 5\' 9"  (1.753 m)   Wt 116 lb (52.6 kg)   SpO2 99%   BMI 17.13 kg/m   Assessment and Plan:  1. Arthritis (Primary) New onset.  Persistent.  Over the course of the past 48 hours patient noted discomfort in the right  MP joint of the fifth digit.   There is tenderness noted over the area but not so exquisite to suggest gout.  Patient has not had any injury and there is no swelling of the joint.  There is no involvement of the distal fifth metatarsal other than the joint with the phalanx.  This seems to be more of an osteoarthritis possibility and we will treat with meloxicam 7.5 mg once a day for 7 to 10 days.  I have suggested that the patient's wear a more secure shoe with cushioning effect other than sandals. - meloxicam (MOBIC) 7.5 MG tablet; Take 1 tablet (7.5 mg total) by mouth daily.  Dispense: 14 tablet; Refill: 0    Elizabeth Sauer, MD

## 2023-11-13 NOTE — Progress Notes (Signed)
 error

## 2024-02-12 DIAGNOSIS — H18523 Epithelial (juvenile) corneal dystrophy, bilateral: Secondary | ICD-10-CM | POA: Diagnosis not present

## 2024-02-12 DIAGNOSIS — Z961 Presence of intraocular lens: Secondary | ICD-10-CM | POA: Diagnosis not present

## 2024-02-12 DIAGNOSIS — H04123 Dry eye syndrome of bilateral lacrimal glands: Secondary | ICD-10-CM | POA: Diagnosis not present

## 2024-03-07 ENCOUNTER — Encounter: Payer: Self-pay | Admitting: Family Medicine

## 2024-03-07 ENCOUNTER — Ambulatory Visit (INDEPENDENT_AMBULATORY_CARE_PROVIDER_SITE_OTHER): Admitting: Family Medicine

## 2024-03-07 VITALS — BP 132/84 | HR 82 | Ht 69.0 in | Wt 114.1 lb

## 2024-03-07 DIAGNOSIS — B369 Superficial mycosis, unspecified: Secondary | ICD-10-CM | POA: Diagnosis not present

## 2024-03-07 MED ORDER — CLOTRIMAZOLE 1 % EX CREA
1.0000 | TOPICAL_CREAM | Freq: Two times a day (BID) | CUTANEOUS | 0 refills | Status: AC
Start: 2024-03-07 — End: ?

## 2024-03-07 NOTE — Progress Notes (Signed)
 Acute Office Visit  Subjective:     Patient ID: Victoria Rush, female    DOB: 03/28/1946, 78 y.o.   MRN: [id number]  Chief Complaint  Patient presents with   Rash    Has a rash on left foot, on her neck and her left forearm, first noticed it one week ago    HPI Patient is in today for Rash Discussed the use of AI scribe software for clinical note transcription with the patient, who gave verbal consent to proceed.  History of Present Illness Victoria Rush is a 78 year old female who presents with a rash on her neck.  The rash began a week ago on her right foot and has spread to her left arm,  and neck. It appears 'real red' on her left forearm and neck. She has applied OTC antibiotic cream with slight relief but no resolution. Scratching and touching other areas with her right hand may have contributed to its spread. She denies diabetes and has not experienced a similar rash previously.   Review of Systems  All other systems reviewed and are negative.       Objective:    BP 132/84   Pulse 82   Ht 5' 9 (1.753 m)   Wt 114 lb 2 oz (51.8 kg)   SpO2 97%   BMI 16.85 kg/m    Physical Exam Vitals and nursing note reviewed.  Constitutional:      Appearance: Normal appearance.  HENT:     Head: Normocephalic.     Right Ear: External ear normal.     Left Ear: External ear normal.  Eyes:     Conjunctiva/sclera: Conjunctivae normal.  Cardiovascular:     Rate and Rhythm: Normal rate.  Pulmonary:     Effort: Pulmonary effort is normal. No respiratory distress.  Abdominal:     Palpations: Abdomen is soft.  Musculoskeletal:        General: Normal range of motion.  Skin:    General: Skin is warm.     Findings: Rash present.     Comments: Oval shaped rash with satellite lesions on the clear marked edge noted over her right medial side of foot. Erythematous patch noted over left fore arm and left anterior cervical region  Neurological:     Mental Status: She is alert and  oriented to person, place, and time.  Psychiatric:        Mood and Affect: Mood normal.     No results found for any visits on 03/07/24.      Assessment & Plan:   Problem List Items Addressed This Visit   None Visit Diagnoses       Fungal infection of skin    -  Primary   Relevant Medications   clotrimazole  (LOTRIMIN ) 1 % cream       Meds ordered this encounter  Medications   clotrimazole  (LOTRIMIN ) 1 % cream    Sig: Apply 1 Application topically 2 (two) times daily.    Dispense:  30 g    Refill:  0   Assessment and Plan Assessment & Plan Fungal skin infection involving right foot, left forearm, and left neck Suspected fungal infection due to spreading rash unresponsive to peroxide and antibiotic cream. Presentation typical for fungal etiology. - Prescribed antifungal cream twice daily after showering and at bedtime. - Advised monitoring for resolution within one week. - Instructed follow-up if rash persists beyond two weeks.   No follow-ups on file.  Vinary  MARLA Ny, MD

## 2024-03-22 ENCOUNTER — Encounter: Payer: Self-pay | Admitting: Physician Assistant

## 2024-03-22 ENCOUNTER — Telehealth: Payer: Self-pay | Admitting: Family Medicine

## 2024-03-22 ENCOUNTER — Ambulatory Visit (INDEPENDENT_AMBULATORY_CARE_PROVIDER_SITE_OTHER): Admitting: Physician Assistant

## 2024-03-22 ENCOUNTER — Other Ambulatory Visit: Payer: Self-pay

## 2024-03-22 VITALS — BP 122/84 | HR 89 | Temp 98.7°F | Ht 69.0 in | Wt 111.0 lb

## 2024-03-22 DIAGNOSIS — E7800 Pure hypercholesterolemia, unspecified: Secondary | ICD-10-CM

## 2024-03-22 DIAGNOSIS — L259 Unspecified contact dermatitis, unspecified cause: Secondary | ICD-10-CM | POA: Diagnosis not present

## 2024-03-22 DIAGNOSIS — K219 Gastro-esophageal reflux disease without esophagitis: Secondary | ICD-10-CM

## 2024-03-22 DIAGNOSIS — J301 Allergic rhinitis due to pollen: Secondary | ICD-10-CM

## 2024-03-22 DIAGNOSIS — E032 Hypothyroidism due to medicaments and other exogenous substances: Secondary | ICD-10-CM

## 2024-03-22 DIAGNOSIS — I10 Essential (primary) hypertension: Secondary | ICD-10-CM

## 2024-03-22 MED ORDER — PANTOPRAZOLE SODIUM 40 MG PO TBEC: 40.0000 mg | DELAYED_RELEASE_TABLET | Freq: Every day | ORAL | 0 refills | Status: DC

## 2024-03-22 MED ORDER — ATORVASTATIN CALCIUM 10 MG PO TABS: 10.0000 mg | ORAL_TABLET | Freq: Every day | ORAL | 0 refills | Status: DC

## 2024-03-22 MED ORDER — AMLODIPINE BESYLATE 5 MG PO TABS
5.0000 mg | ORAL_TABLET | Freq: Every day | ORAL | 0 refills | Status: DC
Start: 2024-03-22 — End: 2024-04-08

## 2024-03-22 MED ORDER — LOSARTAN POTASSIUM 50 MG PO TABS: 50.0000 mg | ORAL_TABLET | Freq: Every day | ORAL | 0 refills | Status: DC

## 2024-03-22 MED ORDER — TRIAMCINOLONE ACETONIDE 0.1 % EX CREA: 1.0000 | TOPICAL_CREAM | Freq: Two times a day (BID) | CUTANEOUS | 1 refills | Status: AC

## 2024-03-22 MED ORDER — LEVOTHYROXINE SODIUM 25 MCG PO TABS: 25.0000 ug | ORAL_TABLET | Freq: Every day | ORAL | 0 refills | Status: DC

## 2024-03-22 NOTE — Telephone Encounter (Signed)
 Patient has a toc appt with Dr. Sol on 04/08/2024 but stated she will be out of all her medications by the appt time. Patient would like enough medications sent in to last until appt date.

## 2024-03-22 NOTE — Telephone Encounter (Signed)
 Called pt let her know I sent in a refill to last until her appt. Pt verbalized understanding.  KP

## 2024-03-22 NOTE — Progress Notes (Signed)
 Date:  03/22/2024   Name:  Victoria Rush   DOB:  [birth year]   MRN:  [id number]   Chief Complaint: Skin Problem (Her Fungal infection of skin from her 03/07/24 office visit has not got worse but no better, left, neck(2 weeks), right thigh, started out on foot, itching, redness )  HPI Victoria Rush is a very pleasant 78 y.o. female new to me today returning to our clinic after a visit with my colleague Dr. Sol on 03/07/24 to evaluate a rash of the left wrist/forearm, left neck, and right leg. At the time, the rash had features suspicious for fungal etiology, so she was put on clotrimazole  cream which has not helped much aside from the left forearm improving. She does have a new area of involvement on the left medial knee. She otherwise feels well. She has been working outside recently, which has involved gardening and pulling up vines/weeds.    Medication list has been reviewed and updated.  Current Meds  Medication Sig   amLODipine  (NORVASC ) 5 MG tablet Take 1 tablet (5 mg total) by mouth daily.   atorvastatin  (LIPITOR) 10 MG tablet Take 1 tablet (10 mg total) by mouth daily.   cetirizine  (ZYRTEC ) 10 MG tablet Take 1 tablet (10 mg total) by mouth daily.   clotrimazole  (LOTRIMIN ) 1 % cream Apply 1 Application topically 2 (two) times daily.   levothyroxine  (SYNTHROID ) 25 MCG tablet Take 1 tablet (25 mcg total) by mouth daily.   losartan  (COZAAR ) 50 MG tablet Take 1 tablet (50 mg total) by mouth daily.   meloxicam  (MOBIC ) 7.5 MG tablet Take 1 tablet (7.5 mg total) by mouth daily.   montelukast  (SINGULAIR ) 10 MG tablet Take 1 tablet (10 mg total) by mouth at bedtime.   pantoprazole  (PROTONIX ) 40 MG tablet Take 1 tablet (40 mg total) by mouth daily.   triamcinolone  cream (KENALOG ) 0.1 % Apply 1 Application topically 2 (two) times daily.     Review of Systems  Patient Active Problem List   Diagnosis Date Noted   Esophageal reflux 02/12/2016   Essential hypertension 02/12/2016   Chronic seasonal  allergic rhinitis due to pollen 12/04/2015   Hypothyroidism 01/30/2015   Hypercholesterolemia 01/30/2015    Allergies  Allergen Reactions   Sulfa Antibiotics     Immunization History  Administered Date(s) Administered   Fluad Quad(high Dose 65+) 03/23/2021, 03/25/2022   Fluad Trivalent(High Dose 65+) 03/28/2023   Influenza, Seasonal, Injecte, Preservative Fre 04/30/2012   Influenza,inj,Quad PF,6+ Mos 05/28/2013, 05/11/2015, 06/02/2016   Influenza-Unspecified 03/05/2019, 03/05/2020   PFIZER(Purple Top)SARS-COV-2 Vaccination 08/08/2019, 08/29/2019, 05/18/2020, 12/24/2020   Pneumococcal Conjugate-13 08/14/2015   Pneumococcal Polysaccharide-23 08/23/2017   Tdap 08/15/2016    Past Surgical History:  Procedure Laterality Date   APPENDECTOMY     COLONOSCOPY  2008   Dr Reeta- repeat in 10 years   SKIN CANCER EXCISION     foot    Social History   Tobacco Use   Smoking status: Never   Smokeless tobacco: Never  Vaping Use   Vaping status: Never Used  Substance Use Topics   Alcohol use: No    Alcohol/week: 0.0 standard drinks of alcohol   Drug use: No    Family History  Problem Relation Age of Onset   Breast cancer Cousin        mat cousin   Diabetes Sister    Diabetes Son         03/07/2024   11:11 AM 09/08/2023    8:11  AM 06/26/2023   10:54 AM 03/28/2023    1:19 PM  GAD 7 : Generalized Anxiety Score  Nervous, Anxious, on Edge 0 0 0 0  Control/stop worrying 0 1 0 0  Worry too much - different things 0 1 0 0  Trouble relaxing 0 1 0 0  Restless 0 1 1 0  Easily annoyed or irritable 0 1 0 0  Afraid - awful might happen 0 0 0 0  Total GAD 7 Score 0 5 1 0  Anxiety Difficulty Not difficult at all Somewhat difficult Not difficult at all Not difficult at all       03/07/2024   11:11 AM 09/08/2023    8:11 AM 06/26/2023   10:54 AM  Depression screen PHQ 2/9  Decreased Interest 0 1 0  Down, Depressed, Hopeless 1 1 0  PHQ - 2 Score 1 2 0  Altered sleeping 0 1 0   Tired, decreased energy 0 0 0  Change in appetite 0 0 1  Feeling bad or failure about yourself  0 1 0  Trouble concentrating 0 1 0  Moving slowly or fidgety/restless 0 1 0  Suicidal thoughts 0 1 0  PHQ-9 Score 1 7 1   Difficult doing work/chores Not difficult at all Somewhat difficult Not difficult at all    BP Readings from Last 3 Encounters:  03/22/24 122/84  03/07/24 132/84  10/09/23 138/88    Wt Readings from Last 3 Encounters:  03/22/24 111 lb (50.3 kg)  03/07/24 114 lb 2 oz (51.8 kg)  10/09/23 116 lb (52.6 kg)    BP 122/84   Pulse 89   Temp 98.7 F (37.1 C)   Ht 5' 9 (1.753 m)   Wt 111 lb (50.3 kg)   SpO2 99%   BMI 16.39 kg/m   Physical Exam Vitals and nursing note reviewed.  Constitutional:      Appearance: Normal appearance.  Cardiovascular:     Rate and Rhythm: Normal rate.  Pulmonary:     Effort: Pulmonary effort is normal.  Abdominal:     General: There is no distension.  Musculoskeletal:        General: Normal range of motion.  Skin:    General: Skin is warm and dry.     Comments: Erythematous rash of the left neck and right lateral thigh with fine scaling. There is also a 4 cm erythematous patch of the left medial knee which is quite warm to the touch and pruritic but not tender.   Neurological:     Mental Status: She is alert and oriented to person, place, and time.     Gait: Gait is intact.  Psychiatric:        Mood and Affect: Mood and affect normal.     Recent Labs     Component Value Date/Time   NA 135 09/08/2023 0852   K 4.1 09/08/2023 0852   CL 94 (L) 09/08/2023 0852   CO2 24 09/08/2023 0852   GLUCOSE 105 (H) 09/08/2023 0852   BUN 11 09/08/2023 0852   CREATININE 0.80 09/08/2023 0852   CALCIUM  9.5 09/08/2023 0852   PROT 6.9 09/08/2023 0852   ALBUMIN 4.4 09/08/2023 0852   AST 17 09/08/2023 0852   ALT 11 09/08/2023 0852   ALKPHOS 84 09/08/2023 0852   BILITOT 0.2 09/08/2023 0852   GFRNONAA 86 06/12/2020 0856   GFRAA 99  06/12/2020 0856    Lab Results  Component Value Date   WBC 6.2 09/23/2022  HGB 14.5 09/23/2022   HCT 41.7 09/23/2022   MCV 83 09/23/2022   PLT 212 09/23/2022   No results found for: HGBA1C Lab Results  Component Value Date   CHOL 132 09/08/2023   HDL 54 09/08/2023   LDLCALC 62 09/08/2023   TRIG 82 09/08/2023   CHOLHDL 3.2 08/28/2018   Lab Results  Component Value Date   TSH 2.930 09/08/2023      Assessment and Plan:  1. Contact dermatitis, unspecified contact dermatitis type, unspecified trigger (Primary) Based on the story, I suspect this is actually a contact allergy/sensitivity. It does not have streaking features characteristic of toxicodendron, but could be in response to other plant species. Will treat with triamcinolone  as below.   - triamcinolone  cream (KENALOG ) 0.1 %; Apply 1 Application topically 2 (two) times daily.  Dispense: 30 g; Refill: 1   F/u PRN   Rolan Hoyle, PA-C, DMSc, Nutritionist [facility] [phone]

## 2024-04-08 ENCOUNTER — Encounter: Payer: Self-pay | Admitting: Family Medicine

## 2024-04-08 ENCOUNTER — Ambulatory Visit (INDEPENDENT_AMBULATORY_CARE_PROVIDER_SITE_OTHER): Admitting: Family Medicine

## 2024-04-08 VITALS — BP 124/76 | HR 82 | Ht 69.0 in | Wt 112.0 lb

## 2024-04-08 DIAGNOSIS — Z130 Encounter for screening for diseases of the blood and blood-forming organs and certain disorders involving the immune mechanism: Secondary | ICD-10-CM | POA: Diagnosis not present

## 2024-04-08 DIAGNOSIS — Z9181 History of falling: Secondary | ICD-10-CM

## 2024-04-08 DIAGNOSIS — J301 Allergic rhinitis due to pollen: Secondary | ICD-10-CM | POA: Diagnosis not present

## 2024-04-08 DIAGNOSIS — I10 Essential (primary) hypertension: Secondary | ICD-10-CM

## 2024-04-08 DIAGNOSIS — E032 Hypothyroidism due to medicaments and other exogenous substances: Secondary | ICD-10-CM | POA: Diagnosis not present

## 2024-04-08 MED ORDER — LOSARTAN POTASSIUM 50 MG PO TABS: 50.0000 mg | ORAL_TABLET | Freq: Every day | ORAL | 1 refills | Status: DC

## 2024-04-08 MED ORDER — AMLODIPINE BESYLATE 5 MG PO TABS: 5.0000 mg | ORAL_TABLET | Freq: Every day | ORAL | 1 refills | Status: AC

## 2024-04-08 MED ORDER — MONTELUKAST SODIUM 10 MG PO TABS: 10.0000 mg | ORAL_TABLET | Freq: Every day | ORAL | 1 refills | Status: AC

## 2024-04-08 NOTE — Progress Notes (Signed)
 Established Patient Office Visit  Subjective   Patient ID: Victoria Rush, female    DOB: 1945-08-19  Age: 78 y.o. MRN: [id number]  Chief Complaint  Patient presents with   Establish Care    Patient is here to establish care with new PCP   Gastroesophageal Reflux     Assessment & Plan:   Problem List Items Addressed This Visit       Cardiovascular and Mediastinum   Essential hypertension - Primary   Relevant Medications   amLODipine  (NORVASC ) 5 MG tablet   losartan  (COZAAR ) 50 MG tablet   Other Relevant Orders   Comprehensive metabolic panel with GFR     Respiratory   Chronic seasonal allergic rhinitis due to pollen   Relevant Medications   montelukast  (SINGULAIR ) 10 MG tablet     Endocrine   Hypothyroidism   Relevant Orders   TSH   Other Visit Diagnoses       Screening for iron deficiency anemia       Relevant Orders   CBC with Differential/Platelet     At high risk for falls       Relevant Orders   Ambulatory referral to Physical Therapy     Assessment and Plan Assessment & Plan Fungal skin infection and dry skin Persistent rash with itching, primarily on the left arm. Rash less red, indicating improvement. Dry skin contributes to itching. - Advise use of anti-itch cream and moisturizer, such as Serna, for dry skin. - Refer to dermatologist for further evaluation of persistent rash.  Hypertension Blood pressure well-controlled at 124/76 mmHg with current medications. - Continue current antihypertensive medications (amlodipine  and losartan ). - Advise reducing salt intake and consider using a salt replacement.  Hypothyroidism Managed with levothyroxine  25 micrograms. Inconsistent intake on an empty stomach may affect absorption. - Instruct to take levothyroxine  on an empty stomach with a glass of water and wait 30 minutes before eating.  Gastroesophageal reflux disease (GERD) GERD symptoms well-controlled with Protonix . No recent heartburn. Reduced  appetite and weight loss possibly due to dietary monotony. Discussed potential risk of weak bones with long-term Protonix  use. - Continue Protonix  for GERD management. - Consider reducing Protonix  dose in the future due to potential risk of osteoporosis. - Advise taking calcium  and vitamin D supplements.  Allergic rhinitis Managed with montelukast  taken at night. - Continue montelukast  for allergy management.  Muscle weakness and imbalance Reports frequent imbalance, no falls. Muscle weakness suspected. Declined physical therapy, prefers home exercises. - Advise starting daily exercises at home. - Consider physical therapy if home exercises are insufficient.  General Health Maintenance Received flu shot. Declined shingles vaccine. - Administer flu shot. - Discuss and offer mammogram screening, she to call for appointment if desired.    Return in about 6 months (around 10/07/2024) for chronic follow up with PCP.   HPI Discussed the use of AI scribe software for clinical note transcription with the patient, who gave verbal consent to proceed.  History of Present Illness Victoria Rush is a 78 year old female who presents to establish care and review medications.  She has a persistent rash on her left arm associated with itching. Despite using three tubes of a prescribed ointment, the rash remains. Her skin is generally very dry, and she has been scratching the rash, which she tries to avoid. She has not used Sarna, a cream recommended for itching, and requests it to be written down for purchase.  She is currently taking losartan  and amlodipine   for hypertension, with a reported blood pressure of 124/76. She does not follow a low-salt diet, despite her daughter's advice, as she enjoys the taste of salt.  She takes levothyroxine  25 micrograms for her thyroid  condition, usually in the morning with water, but does not consistently wait 30 minutes before eating.  She experiences acid reflux,  which is well-controlled with Protonix , and reports no recent episodes. She has lost weight and has a decreased appetite, which she attributes to being tired of the same foods. She does not eat late at night to avoid exacerbating her reflux.  She reports occasional balance issues but has not fallen. She is considering home exercises to improve her balance.  She lives with her 63 year old son, having lost her husband last October. She denies depression as a cause for her decreased appetite, stating it has been ongoing for several years.     Review of Systems  All other systems reviewed and are negative.     Objective:     BP 124/76   Pulse 82   Ht 5' 9 (1.753 m)   Wt 112 lb (50.8 kg)   SpO2 98%   BMI 16.54 kg/m     Physical Exam Vitals and nursing note reviewed.  Constitutional:      Appearance: Normal appearance.  HENT:     Head: Normocephalic.     Right Ear: External ear normal.     Left Ear: External ear normal.  Eyes:     Conjunctiva/sclera: Conjunctivae normal.  Cardiovascular:     Rate and Rhythm: Normal rate.  Pulmonary:     Effort: Pulmonary effort is normal. No respiratory distress.  Abdominal:     Palpations: Abdomen is soft.  Musculoskeletal:        General: Normal range of motion.  Skin:    General: Skin is warm.  Neurological:     Mental Status: She is alert and oriented to person, place, and time.  Psychiatric:        Mood and Affect: Mood normal.      No results found for any visits on 04/08/24.     The 10-year ASCVD risk score (Arnett DK, et al., 2019) is: 27%      Vinary K Reese Stockman, MD

## 2024-04-09 ENCOUNTER — Ambulatory Visit: Payer: Self-pay | Admitting: Family Medicine

## 2024-04-09 LAB — COMPREHENSIVE METABOLIC PANEL WITH GFR
ALT: 12 IU/L (ref 0–32)
AST: 16 IU/L (ref 0–40)
Albumin: 4.6 g/dL (ref 3.8–4.8)
Alkaline Phosphatase: 76 IU/L (ref 49–135)
BUN/Creatinine Ratio: 15 (ref 12–28)
BUN: 9 mg/dL (ref 8–27)
Bilirubin Total: 0.3 mg/dL (ref 0.0–1.2)
CO2: 22 mmol/L (ref 20–29)
Calcium: 9.5 mg/dL (ref 8.7–10.3)
Chloride: 95 mmol/L — ABNORMAL LOW (ref 96–106)
Creatinine, Ser: 0.61 mg/dL (ref 0.57–1.00)
Globulin, Total: 2.3 g/dL (ref 1.5–4.5)
Glucose: 117 mg/dL — ABNORMAL HIGH (ref 70–99)
Potassium: 4.6 mmol/L (ref 3.5–5.2)
Sodium: 134 mmol/L (ref 134–144)
Total Protein: 6.9 g/dL (ref 6.0–8.5)
eGFR: 91 mL/min/1.73 (ref >59)

## 2024-04-09 LAB — CBC WITH DIFFERENTIAL/PLATELET
Basophils Absolute: 0 x10E3/uL (ref 0.0–0.2)
Basos: 1 %
EOS (ABSOLUTE): 0 x10E3/uL (ref 0.0–0.4)
Eos: 0 %
Hematocrit: 41.3 % (ref 34.0–46.6)
Hemoglobin: 13.5 g/dL (ref 11.1–15.9)
Immature Grans (Abs): 0 x10E3/uL (ref 0.0–0.1)
Immature Granulocytes: 0 %
Lymphocytes Absolute: 1.4 x10E3/uL (ref 0.7–3.1)
Lymphs: 31 %
MCH: 27.9 pg (ref 26.6–33.0)
MCHC: 32.7 g/dL (ref 31.5–35.7)
MCV: 85 fL (ref 79–97)
Monocytes Absolute: 0.6 x10E3/uL (ref 0.1–0.9)
Monocytes: 12 %
Neutrophils Absolute: 2.6 x10E3/uL (ref 1.4–7.0)
Neutrophils: 56 %
Platelets: 208 x10E3/uL (ref 150–450)
RBC: 4.84 x10E6/uL (ref 3.77–5.28)
RDW: 13.5 % (ref 11.7–15.4)
WBC: 4.6 x10E3/uL (ref 3.4–10.8)

## 2024-04-09 LAB — TSH: TSH: 2.54 u[IU]/mL (ref 0.450–4.500)

## 2024-04-17 DIAGNOSIS — L508 Other urticaria: Secondary | ICD-10-CM | POA: Diagnosis not present

## 2024-04-25 ENCOUNTER — Ambulatory Visit: Attending: Family Medicine

## 2024-04-25 DIAGNOSIS — Z9181 History of falling: Secondary | ICD-10-CM | POA: Diagnosis not present

## 2024-04-25 DIAGNOSIS — R2681 Unsteadiness on feet: Secondary | ICD-10-CM | POA: Diagnosis not present

## 2024-04-25 NOTE — Therapy (Unsigned)
 OUTPATIENT PHYSICAL THERAPY BALANCE EVALUATION   Patient Name: Nanea Jared MRN: [id number] DOB:1946/03/21, 78 y.o., female Today's Date: 04/26/2024  END OF SESSION:  PT End of Session - 04/25/24 0804     Visit Number 1    Number of Visits 9    Date for Recertification  06/20/24    Authorization Type eval: 04/25/24    PT Start Time 0800    PT Stop Time 0845    PT Time Calculation (min) 45 min    Equipment Utilized During Treatment Gait belt    Behavior During Therapy WFL for tasks assessed/performed         Past Medical History:  Diagnosis Date   Cancer (HCC) 1981   melanoma   Contact dermatitis 02/12/2016   GERD (gastroesophageal reflux disease)    Hyperlipidemia    Hypertension    Skin cancer 04/2019   on the nose   Thyroid  disease    Past Surgical History:  Procedure Laterality Date   APPENDECTOMY     COLONOSCOPY  2008   Dr Reeta- repeat in 10 years   SKIN CANCER EXCISION     foot   Patient Active Problem List   Diagnosis Date Noted   Esophageal reflux 02/12/2016   Essential hypertension 02/12/2016   Chronic seasonal allergic rhinitis due to pollen 12/04/2015   Hypothyroidism 01/30/2015   Hypercholesterolemia 01/30/2015   PCP: Kotturi, Vinay K, MD  REFERRING PROVIDER: Kotturi, Vinay K, MD   REFERRING DIAG: Z91.81 (ICD-10-CM) - At high risk for falls   RATIONALE FOR EVALUATION AND TREATMENT: Rehabilitation  THERAPY DIAG: Unsteadiness on feet  ONSET DATE: Approximately 1 year;  FOLLOW-UP APPT SCHEDULED WITH REFERRING PROVIDER: Yes    SUBJECTIVE:                                                                                                                                                                                         SUBJECTIVE STATEMENT:  Unsteadiness  PERTINENT HISTORY:  Pt reports unsteadiness for at least the last year. She walks a lot and works in the yard and notices that she occasionally stumbles. Pt has not had any falls and she  has participated in PT in the past for her back but has never worked on her balance. She is unsure if she would like to participate with therapy and initially declined the referral from her PCP.     Pain: No Numbness/Tingling: Yes, occasional R foot tingling; Focal Weakness: No Recent changes in overall health/medication: No Prior history of physical therapy for balance:  No, PT for back pain but no PT for balance Dominant hand: right Red flags:  Positive for melanoma. Negative for abdominal pain, chills/fever, night sweats, nausea, vomiting, unrelenting pain;  PRECAUTIONS: Fall  WEIGHT BEARING RESTRICTIONS: No  FALLS: Has patient fallen in last 6 months? No  Living Environment Lives with: lives with their son, dtr lives next door Lives in: House/apartment Stairs: steps and ramp to enter; Has following equipment at home: Single point cane and Walker - 2 wheeled  Prior level of function: Independent  Occupational demands: Not working  Hobbies: Working in the yard, sewing, Pension scheme manager;  Patient Goals: Pt unsure if she wants to participate in PT but does state that she wants to avoid falling;   OBJECTIVE:   Patient Surveys  ABC: Deferred  Cognition Patient is oriented to person, place, and time.  Recent memory is intact.  Remote memory is intact.  Attention span and concentration are intact.  Expressive speech is intact.  Patient's fund of knowledge is within normal limits for educational level.    Gross Musculoskeletal Assessment Tremor: None Bulk: Normal Tone: Normal  Posture: Forward head and rounded shoulders;  AROM No gross deficits noted in UE/LE function ROM;  LE MMT: MMT (out of 5) Right  Left   Hip flexion 4+ 4+  Knee flexion 5 5  Knee extension 5 5  Ankle dorsiflexion 5 5  Ankle plantarflexion Active Active  (* = pain; Blank rows = not tested)  Sensation Deferred  Reflexes Deferred  Cranial  Nerves Deferred  Coordination/Cerebellar Deferred  Bed mobility: Deferred  Transfers: Assistive device utilized: None  Sit to stand: Complete Independence Stand to sit: Complete Independence Chair to chair: Complete Independence Floor: not assessed  Curb:  Deferred  Stairs: Level of Assistance: Complete Independence Stair Negotiation Technique: Alternating Pattern  with No Rails Number of Stairs: 4   Height of Stairs: 6  Comments: Slight decrease in speed and confidence but no overt LOB  Gait: Distance walked: 150' Assistive device utilized: None Level of assistance: Complete Independence Comments: Pt does demonstrate occasional stagger step during gait but no overt LOB;  Functional Outcome Measures  Results Comments  BERG 51/56   DGI 23/24   FGA    TUG    5TSTS 11.0s   6 Minute Walk Test    10 Meter Gait Speed WNL   (Blank rows = not tested)   TODAY'S TREATMENT   Neuromuscular Re-education  Interpreted and educated pt regarding examination findings and interventions; HEP issued and provided to patient; Performed: Tandem balance alternating forward LE x 30s each; Single leg balance x 30s BLE; Squats x 10;   PATIENT EDUCATION:  Education details: Examination findings, plan of care, and HEP; Person educated: Patient Education method: Explanation, Verbal cues, and Handouts Education comprehension: verbalized understanding, returned demonstration, verbal cues required, and needs further education   HOME EXERCISE PROGRAM:  Access Code: [email or web address] URL: [email or web address] Date: 04/25/2024 Prepared by: Selinda Eck  Exercises - Standing Tandem Balance with Counter Support  - 1 x daily - 7 x weekly - 2 reps - 30s hold - Standing Tandem Balance with Counter Support (Mirrored)  - 1 x daily - 7 x weekly - 2 reps - 30s hold - Standing Single Leg Stance with Counter Support  - 1 x daily - 7 x weekly - 2 reps - 30s hold - Standing Single Leg  Stance with Counter Support (Mirrored)  - 1 x daily - 7 x weekly - 2 reps - 30s hold - Mini Squat with Counter Support  - 1 x daily - 7 x weekly -  3 sets - 10 reps  ASSESSMENT:  CLINICAL IMPRESSION: Patient is a 78 y.o. female who was seen today for physical therapy evaluation and treatment for unsteadiness and increased risk for falls. She scored 51/56 on the BERG, 23/24 on the DGI, and 11.0s on 5TSTS. She does have some occasional stagger steps during gait and notes occasional unsteadiness at home and when working in the yard. At the end of the evaluation pt agrees to participation with PT services.  OBJECTIVE IMPAIRMENTS: decreased balance.   ACTIVITY LIMITATIONS: standing, squatting, and stairs  PARTICIPATION LIMITATIONS: shopping, community activity, and yard work  PERSONAL FACTORS: Behavior pattern, Past/current experiences, and 1 comorbidity: HTN are also affecting patient's functional outcome.   REHAB POTENTIAL: Fair    CLINICAL DECISION MAKING: Stable/uncomplicated  EVALUATION COMPLEXITY: Low   GOALS: Goals reviewed with patient? No  SHORT TERM GOALS: Target date: 05/23/2024  Pt will be independent with HEP in order to improve strength and balance in order to decrease fall risk and improve function at home. Baseline:  Goal status: INITIAL   LONG TERM GOALS: Target date: 06/20/2024  Pt will improve ABC by at least 13% in order to demonstrate clinically significant improvement in balance confidence.  Baseline: To be completed Goal status: INITIAL  2.  Pt will improve BERG by at least 3 points in order to demonstrate clinically significant improvement in balance.   Baseline: 51/56 Goal status: INITIAL  3.  Pt will improve MiniBEST by at least 3 points in order to demonstrate clinically significant improvement in balance.     Baseline: To be completed Goal status: INITIAL  PLAN: PT FREQUENCY: 1x/week  PT DURATION: 8 weeks  PLANNED INTERVENTIONS: Therapeutic  exercises, Therapeutic activity, Neuromuscular re-education, Balance training, Gait training, Patient/Family education, Self Care, Joint mobilization, Joint manipulation, Vestibular training, Canalith repositioning, Orthotic/Fit training, DME instructions, Dry Needling, Electrical stimulation, Spinal manipulation, Spinal mobilization, Cryotherapy, Moist heat, Taping, Traction, Ultrasound, Ionotophoresis 4mg /ml Dexamethasone, Manual therapy, and Re-evaluation.  PLAN FOR NEXT SESSION: Complete ABC, perform MiniBEST, progress balance/strength/reaction time exercises, modify HEP as necessary;   Hero Mccathern D Shannell Mikkelsen PT, DPT, GCS  Remus Hagedorn 04/26/2024, 8:53 AM

## 2024-04-30 NOTE — Therapy (Signed)
 OUTPATIENT PHYSICAL THERAPY BALANCE TREATMENT  Patient Name: Victoria Rush MRN: [id number] DOB:1945/10/28, 78 y.o., female Today's Date: 05/01/2024  END OF SESSION:  PT End of Session - 05/01/24 0909     Visit Number 2    Number of Visits 9    Date for Recertification  06/20/24    Authorization Type eval: 04/25/24    PT Start Time 0930    PT Stop Time 1015    PT Time Calculation (min) 45 min    Equipment Utilized During Treatment Gait belt    Activity Tolerance Patient tolerated treatment well    Behavior During Therapy WFL for tasks assessed/performed         Past Medical History:  Diagnosis Date   Cancer (HCC) 1981   melanoma   Contact dermatitis 02/12/2016   GERD (gastroesophageal reflux disease)    Hyperlipidemia    Hypertension    Skin cancer 04/2019   on the nose   Thyroid  disease    Past Surgical History:  Procedure Laterality Date   APPENDECTOMY     COLONOSCOPY  2008   Dr Reeta- repeat in 10 years   SKIN CANCER EXCISION     foot   Patient Active Problem List   Diagnosis Date Noted   Esophageal reflux 02/12/2016   Essential hypertension 02/12/2016   Chronic seasonal allergic rhinitis due to pollen 12/04/2015   Hypothyroidism 01/30/2015   Hypercholesterolemia 01/30/2015   PCP: Kotturi, Vinay K, MD  REFERRING PROVIDER: Kotturi, Vinay K, MD   REFERRING DIAG: Z91.81 (ICD-10-CM) - At high risk for falls   RATIONALE FOR EVALUATION AND TREATMENT: Rehabilitation  THERAPY DIAG: Unsteadiness on feet  ONSET DATE: Approximately 1 year;  FOLLOW-UP APPT SCHEDULED WITH REFERRING PROVIDER: Yes   FROM INITIAL EVALUATION SUBJECTIVE:                                                                                                                                                                                         SUBJECTIVE STATEMENT:  Unsteadiness  PERTINENT HISTORY:  Pt reports unsteadiness for at least the last year. She walks a lot and works in the yard  and notices that she occasionally stumbles. Pt has not had any falls and she has participated in PT in the past for her back but has never worked on her balance. She is unsure if she would like to participate with therapy and initially declined the referral from her PCP.     Pain: No Numbness/Tingling: Yes, occasional R foot tingling; Focal Weakness: No Recent changes in overall health/medication: No Prior history of physical therapy for balance:  No, PT for back pain  but no PT for balance Dominant hand: right Red flags: Positive for melanoma. Negative for abdominal pain, chills/fever, night sweats, nausea, vomiting, unrelenting pain;  PRECAUTIONS: Fall  WEIGHT BEARING RESTRICTIONS: No  FALLS: Has patient fallen in last 6 months? No  Living Environment Lives with: lives with their son, dtr lives next door Lives in: House/apartment Stairs: steps and ramp to enter; Has following equipment at home: Single point cane and Walker - 2 wheeled  Prior level of function: Independent  Occupational demands: Not working  Hobbies: Working in the yard, sewing, pension scheme manager;  Patient Goals: Pt unsure if she wants to participate in PT but does state that she wants to avoid falling;   OBJECTIVE:   Patient Surveys  ABC: Deferred  Cognition Patient is oriented to person, place, and time.  Recent memory is intact.  Remote memory is intact.  Attention span and concentration are intact.  Expressive speech is intact.  Patient's fund of knowledge is within normal limits for educational level.    Gross Musculoskeletal Assessment Tremor: None Bulk: Normal Tone: Normal  Posture: Forward head and rounded shoulders;  AROM No gross deficits noted in UE/LE function ROM;  LE MMT: MMT (out of 5) Right  Left   Hip flexion 4+ 4+  Knee flexion 5 5  Knee extension 5 5  Ankle dorsiflexion 5 5  Ankle plantarflexion Active Active  (* = pain; Blank rows = not  tested)  Sensation Deferred  Cranial Nerves Deferred  Coordination/Cerebellar Deferred  Transfers: Assistive device utilized: None  Sit to stand: Complete Independence Stand to sit: Complete Independence Chair to chair: Complete Independence Floor: not assessed  Curb:  Deferred  Stairs: Level of Assistance: Complete Independence Stair Negotiation Technique: Alternating Pattern  with No Rails Number of Stairs: 4   Height of Stairs: 6  Comments: Slight decrease in speed and confidence but no overt LOB  Gait: Distance walked: 150' Assistive device utilized: None Level of assistance: Complete Independence Comments: Pt does demonstrate occasional stagger step during gait but no overt LOB;  Functional Outcome Measures  Results Comments  BERG 51/56   DGI 23/24   FGA    TUG    5TSTS 11.0s   6 Minute Walk Test    10 Meter Gait Speed WNL   (Blank rows = not tested)   TODAY'S TREATMENT    SUBJECTIVE: Pt reports that she is doing well today. No changes since the initial evaluation. She has performed her HEP at least once per day since the evaluation. Denies pain. No specific questions or concerns.    PAIN: Denies   Therapeutic Activity  NuStep L2-4 x 10 minutes for BLE strength as well as cardiopulmonary endurance during interval history with therapist adjusting resistance throughout;   Neuromuscular Re-education  ABC: 35.6%; MiniBEST: 22/28; mCTSIB: 30s in conditions 1-3, 20s in condition 4; Tandem balance alternating forward LE x 30s each; Tandem gait in // bars no UE support x multiple lengths; each; Airex tandem balance alternating forward LE x 30s each; Airex tandem gait in // bars no UE support x multiple lengths; each; Rockerboard A/P orientation static balance followed by heel/toe weight shifting x 30s each; Updated HEP and reviewed with patient;   PATIENT EDUCATION:  Education details: Balance exercises and updated HEP; Person educated:  Patient Education method: Explanation, Verbal cues, and Handouts Education comprehension: verbalized understanding, returned demonstration, verbal cues required, and needs further education   HOME EXERCISE PROGRAM:  Access Code: [email or web address] URL: [email or web address] Date: 05/01/2024 Prepared by:  Nayelli Inglis  Exercises - Standing Tandem Balance with Counter Support  - 2 x daily - 7 x weekly - 2 reps - 30s hold - Standing Tandem Balance with Counter Support (Mirrored)  - 2 x daily - 7 x weekly - 2 reps - 30s hold - Standing Single Leg Stance with Counter Support  - 2 x daily - 7 x weekly - 2 reps - 30s hold - Standing Single Leg Stance with Counter Support (Mirrored)  - 2 x daily - 7 x weekly - 2 reps - 30s hold - Tandem Walking Next to Counter  - 2 x daily - 7 x weekly - 2 reps - 30s hold - Mini Squat with Counter Support  - 2 x daily - 7 x weekly - 2 sets - 10 reps   ASSESSMENT:  CLINICAL IMPRESSION: Updated additional outcome measures with patient. Low balance confidence reported on the ABC and higher level balance deficits identified by Mini-BESTest score of 22/28. Progressed balance exercises during session today with patient and updated HEP. Pt encouraged to follow-up as scheduled. She will benefit from PT services to address deficits in strength, balance, and mobility in order to return to improve function at home and decrease her risk for falls.    OBJECTIVE IMPAIRMENTS: decreased balance.   ACTIVITY LIMITATIONS: standing, squatting, and stairs  PARTICIPATION LIMITATIONS: shopping, community activity, and yard work  PERSONAL FACTORS: Behavior pattern, Past/current experiences, and 1 comorbidity: HTN are also affecting patient's functional outcome.   REHAB POTENTIAL: Fair    CLINICAL DECISION MAKING: Stable/uncomplicated  EVALUATION COMPLEXITY: Low   GOALS: Goals reviewed with patient? No  SHORT TERM GOALS: Target date: 05/23/2024  Pt will be  independent with HEP in order to improve strength and balance in order to decrease fall risk and improve function at home. Baseline:  Goal status: INITIAL   LONG TERM GOALS: Target date: 06/20/2024  Pt will improve ABC by at least 13% in order to demonstrate clinically significant improvement in balance confidence.  Baseline: 05/01/24: 35.6% Goal status: INITIAL  2.  Pt will improve BERG by at least 3 points in order to demonstrate clinically significant improvement in balance.   Baseline: 51/56 Goal status: INITIAL  3.  Pt will improve MiniBEST by at least 3 points in order to demonstrate clinically significant improvement in balance.     Baseline: 05/01/24: 22/28; Goal status: INITIAL  PLAN: PT FREQUENCY: 1x/week  PT DURATION: 8 weeks  PLANNED INTERVENTIONS: Therapeutic exercises, Therapeutic activity, Neuromuscular re-education, Balance training, Gait training, Patient/Family education, Self Care, Joint mobilization, Joint manipulation, Vestibular training, Canalith repositioning, Orthotic/Fit training, DME instructions, Dry Needling, Electrical stimulation, Spinal manipulation, Spinal mobilization, Cryotherapy, Moist heat, Taping, Traction, Ultrasound, Ionotophoresis 4mg /ml Dexamethasone, Manual therapy, and Re-evaluation.  PLAN FOR NEXT SESSION: Progress balance/strength/reaction time exercises, modify HEP as necessary;   Carla Whilden D Daisean Brodhead PT, DPT, GCS  Saul Fabiano 05/01/2024, 12:59 PM

## 2024-05-01 ENCOUNTER — Ambulatory Visit

## 2024-05-01 DIAGNOSIS — R2681 Unsteadiness on feet: Secondary | ICD-10-CM | POA: Diagnosis not present

## 2024-05-01 DIAGNOSIS — Z9181 History of falling: Secondary | ICD-10-CM | POA: Diagnosis not present

## 2024-05-06 ENCOUNTER — Ambulatory Visit: Attending: Family Medicine

## 2024-05-06 DIAGNOSIS — R2681 Unsteadiness on feet: Secondary | ICD-10-CM | POA: Insufficient documentation

## 2024-05-06 NOTE — Therapy (Signed)
 OUTPATIENT PHYSICAL THERAPY BALANCE TREATMENT  Patient Name: Shye Doty MRN: [id number] DOB:[birth year], 78 y.o., female Today's Date: 05/06/2024  END OF SESSION:  PT End of Session - 05/06/24 0917     Visit Number 3    Number of Visits 9    Date for Recertification  06/20/24    Authorization Type eval: 04/25/24    PT Start Time 0930    PT Stop Time 1015    PT Time Calculation (min) 45 min    Equipment Utilized During Treatment Gait belt    Activity Tolerance Patient tolerated treatment well    Behavior During Therapy WFL for tasks assessed/performed         Past Medical History:  Diagnosis Date   Cancer (HCC) 1981   melanoma   Contact dermatitis 02/12/2016   GERD (gastroesophageal reflux disease)    Hyperlipidemia    Hypertension    Skin cancer 04/2019   on the nose   Thyroid  disease    Past Surgical History:  Procedure Laterality Date   APPENDECTOMY     COLONOSCOPY  2008   Dr Reeta- repeat in 10 years   SKIN CANCER EXCISION     foot   Patient Active Problem List   Diagnosis Date Noted   Esophageal reflux 02/12/2016   Essential hypertension 02/12/2016   Chronic seasonal allergic rhinitis due to pollen 12/04/2015   Hypothyroidism 01/30/2015   Hypercholesterolemia 01/30/2015   PCP: Kotturi, Vinay K, MD  REFERRING PROVIDER: Kotturi, Vinay K, MD   REFERRING DIAG: Z91.81 (ICD-10-CM) - At high risk for falls   RATIONALE FOR EVALUATION AND TREATMENT: Rehabilitation  THERAPY DIAG: Unsteadiness on feet  ONSET DATE: Approximately 1 year;  FOLLOW-UP APPT SCHEDULED WITH REFERRING PROVIDER: Yes   FROM INITIAL EVALUATION SUBJECTIVE:                                                                                                                                                                                         SUBJECTIVE STATEMENT:  Unsteadiness  PERTINENT HISTORY:  Pt reports unsteadiness for at least the last year. She walks a lot and works in the yard  and notices that she occasionally stumbles. Pt has not had any falls and she has participated in PT in the past for her back but has never worked on her balance. She is unsure if she would like to participate with therapy and initially declined the referral from her PCP.     Pain: No Numbness/Tingling: Yes, occasional R foot tingling; Focal Weakness: No Recent changes in overall health/medication: No Prior history of physical therapy for balance:  No, PT for back pain  but no PT for balance Dominant hand: right Red flags: Positive for melanoma. Negative for abdominal pain, chills/fever, night sweats, nausea, vomiting, unrelenting pain;  PRECAUTIONS: Fall  WEIGHT BEARING RESTRICTIONS: No  FALLS: Has patient fallen in last 6 months? No  Living Environment Lives with: lives with their son, dtr lives next door Lives in: House/apartment Stairs: steps and ramp to enter; Has following equipment at home: Single point cane and Walker - 2 wheeled  Prior level of function: Independent  Occupational demands: Not working  Hobbies: Working in the yard, sewing, pension scheme manager;  Patient Goals: Pt unsure if she wants to participate in PT but does state that she wants to avoid falling;   OBJECTIVE:   Patient Surveys  ABC: Deferred  Cognition Patient is oriented to person, place, and time.  Recent memory is intact.  Remote memory is intact.  Attention span and concentration are intact.  Expressive speech is intact.  Patient's fund of knowledge is within normal limits for educational level.    Gross Musculoskeletal Assessment Tremor: None Bulk: Normal Tone: Normal  Posture: Forward head and rounded shoulders;  AROM No gross deficits noted in UE/LE function ROM;  LE MMT: MMT (out of 5) Right  Left   Hip flexion 4+ 4+  Knee flexion 5 5  Knee extension 5 5  Ankle dorsiflexion 5 5  Ankle plantarflexion Active Active  (* = pain; Blank rows = not  tested)  Sensation Deferred  Cranial Nerves Deferred  Coordination/Cerebellar Deferred  Transfers: Assistive device utilized: None  Sit to stand: Complete Independence Stand to sit: Complete Independence Chair to chair: Complete Independence Floor: not assessed  Curb:  Deferred  Stairs: Level of Assistance: Complete Independence Stair Negotiation Technique: Alternating Pattern  with No Rails Number of Stairs: 4   Height of Stairs: 6  Comments: Slight decrease in speed and confidence but no overt LOB  Gait: Distance walked: 150' Assistive device utilized: None Level of assistance: Complete Independence Comments: Pt does demonstrate occasional stagger step during gait but no overt LOB;  mCTSIB: 30s in conditions 1-3, 20s in condition 4;  Functional Outcome Measures  Results Comments  BERG 51/56   DGI 23/24   FGA    TUG    5TSTS 11.0s   6 Minute Walk Test    10 Meter Gait Speed WNL   (Blank rows = not tested)    TODAY'S TREATMENT    SUBJECTIVE: Pt reports that she is doing well today. No changes since the last therapy session. She has been performing her HEP. Denies pain. No specific questions or concerns.    PAIN: Denies   Therapeutic Activity  NuStep L2-6 x 10 minutes for BLE strength as well as cardiopulmonary endurance during interval history with therapist adjusting resistance throughout; Nautilus resisted gait 80# forward and backward x 5 each, R lateral and L lateral x 3 each; Sit to stand from low mat table with 6# overhead med ball press 2 x 10;   Neuromuscular Re-education  Airex balance beam tandem gait in // bars no UE support x multiple lengths; each; Airex balance beam side stepping in // bars no UE support x multiple lengths; Forward gait in hallway with horizontal ball passes to therapist head/eye follow 2 x 70' toward each sides  Rockerboard A/P orientation static balance x 30s; Rockerboard A/P orientation forward/backward weight  shifting x 30s each; Rockerboard A/P orientation with horizontal and vertical head turns x 30s each; Updated HEP and reviewed with patient;   PATIENT  EDUCATION:  Education details: Balance exercises and updated HEP; Person educated: Patient Education method: Explanation, Verbal cues, and Handouts Education comprehension: verbalized understanding, returned demonstration, verbal cues required, and needs further education   HOME EXERCISE PROGRAM:  Access Code: [email or web address] URL: [email or web address] Date: 05/06/2024 Prepared by: Selinda Eck  Exercises - Standing Tandem Balance with Counter Support  - 2 x daily - 7 x weekly - 2 reps - 30s hold - Standing Tandem Balance with Counter Support (Mirrored)  - 2 x daily - 7 x weekly - 2 reps - 30s hold - Standing Single Leg Stance with Counter Support  - 2 x daily - 7 x weekly - 2 reps - 30s hold - Standing Single Leg Stance with Counter Support (Mirrored)  - 2 x daily - 7 x weekly - 2 reps - 30s hold - Tandem Walking Next to Counter  - 2 x daily - 7 x weekly - 2 reps - 30s hold - Mini Squat with Counter Support  - 2 x daily - 7 x weekly - 2 sets - 10 reps - Sit to Stand Without Arm Support  - 2 x daily - 7 x weekly - 2 sets - 10 reps   ASSESSMENT:  CLINICAL IMPRESSION: Progressed balance and strengthening exercises during session today with patient and updated HEP. Pt encouraged to follow-up as scheduled. She will benefit from PT services to address deficits in strength, balance, and mobility in order to return to improve function at home and decrease her risk for falls.    OBJECTIVE IMPAIRMENTS: decreased balance.   ACTIVITY LIMITATIONS: standing, squatting, and stairs  PARTICIPATION LIMITATIONS: shopping, community activity, and yard work  PERSONAL FACTORS: Behavior pattern, Past/current experiences, and 1 comorbidity: HTN are also affecting patient's functional outcome.   REHAB POTENTIAL: Fair    CLINICAL DECISION  MAKING: Stable/uncomplicated  EVALUATION COMPLEXITY: Low   GOALS: Goals reviewed with patient? No  SHORT TERM GOALS: Target date: 05/23/2024  Pt will be independent with HEP in order to improve strength and balance in order to decrease fall risk and improve function at home. Baseline:  Goal status: INITIAL   LONG TERM GOALS: Target date: 06/20/2024  Pt will improve ABC by at least 13% in order to demonstrate clinically significant improvement in balance confidence.  Baseline: 05/01/24: 35.6% Goal status: INITIAL  2.  Pt will improve BERG by at least 3 points in order to demonstrate clinically significant improvement in balance.   Baseline: 51/56 Goal status: INITIAL  3.  Pt will improve MiniBEST by at least 3 points in order to demonstrate clinically significant improvement in balance.     Baseline: 05/01/24: 22/28; Goal status: INITIAL  PLAN: PT FREQUENCY: 1x/week  PT DURATION: 8 weeks  PLANNED INTERVENTIONS: Therapeutic exercises, Therapeutic activity, Neuromuscular re-education, Balance training, Gait training, Patient/Family education, Self Care, Joint mobilization, Joint manipulation, Vestibular training, Canalith repositioning, Orthotic/Fit training, DME instructions, Dry Needling, Electrical stimulation, Spinal manipulation, Spinal mobilization, Cryotherapy, Moist heat, Taping, Traction, Ultrasound, Ionotophoresis 4mg /ml Dexamethasone, Manual therapy, and Re-evaluation.  PLAN FOR NEXT SESSION: Progress balance/strength/reaction time exercises, modify HEP as necessary;   Zoraya Fiorenza D Devanshi Califf PT, DPT, GCS  Markian Glockner 05/06/2024, 1:14 PM

## 2024-05-15 ENCOUNTER — Ambulatory Visit

## 2024-05-15 DIAGNOSIS — R2681 Unsteadiness on feet: Secondary | ICD-10-CM | POA: Diagnosis not present

## 2024-05-15 NOTE — Therapy (Signed)
 OUTPATIENT PHYSICAL THERAPY BALANCE TREATMENT  Patient Name: Victoria Rush MRN: [id number] DOB:1945/09/25, 78 y.o., female Today's Date: 05/15/2024  END OF SESSION:  PT End of Session - 05/15/24 0941     Visit Number 4    Number of Visits 9    Date for Recertification  06/20/24    Authorization Type eval: 04/25/24    PT Start Time 0933    PT Stop Time 1015    PT Time Calculation (min) 42 min    Equipment Utilized During Treatment Gait belt    Activity Tolerance Patient tolerated treatment well    Behavior During Therapy [facility] for tasks assessed/performed         Past Medical History:  Diagnosis Date   Cancer (HCC) 1981   melanoma   Contact dermatitis 02/12/2016   GERD (gastroesophageal reflux disease)    Hyperlipidemia    Hypertension    Skin cancer 04/2019   on the nose   Thyroid  disease    Past Surgical History:  Procedure Laterality Date   APPENDECTOMY     COLONOSCOPY  2008   Dr Reeta- repeat in 10 years   SKIN CANCER EXCISION     foot   Patient Active Problem List   Diagnosis Date Noted   Esophageal reflux 02/12/2016   Essential hypertension 02/12/2016   Chronic seasonal allergic rhinitis due to pollen 12/04/2015   Hypothyroidism 01/30/2015   Hypercholesterolemia 01/30/2015   PCP: Kotturi, Vinay K, MD  REFERRING PROVIDER: Kotturi, Vinay K, MD   REFERRING DIAG: Z91.81 (ICD-10-CM) - At high risk for falls   RATIONALE FOR EVALUATION AND TREATMENT: Rehabilitation  THERAPY DIAG: Unsteadiness on feet  ONSET DATE: Approximately 1 year;  FOLLOW-UP APPT SCHEDULED WITH REFERRING PROVIDER: Yes   FROM INITIAL EVALUATION SUBJECTIVE:                                                                                                                                                                                         SUBJECTIVE STATEMENT:  Unsteadiness  PERTINENT HISTORY:  Pt reports unsteadiness for at least the last year. She walks a lot and works in the yard  and notices that she occasionally stumbles. Pt has not had any falls and she has participated in PT in the past for her back but has never worked on her balance. She is unsure if she would like to participate with therapy and initially declined the referral from her PCP.     Pain: No Numbness/Tingling: Yes, occasional R foot tingling; Focal Weakness: No Recent changes in overall health/medication: No Prior history of physical therapy for balance:  No, PT for back pain  but no PT for balance Dominant hand: right Red flags: Positive for melanoma. Negative for abdominal pain, chills/fever, night sweats, nausea, vomiting, unrelenting pain;  PRECAUTIONS: Fall  WEIGHT BEARING RESTRICTIONS: No  FALLS: Has patient fallen in last 6 months? No  Living Environment Lives with: lives with their son, dtr lives next door Lives in: House/apartment Stairs: steps and ramp to enter; Has following equipment at home: Single point cane and Walker - 2 wheeled  Prior level of function: Independent  Occupational demands: Not working  Hobbies: Working in the yard, sewing, pension scheme manager;  Patient Goals: Pt unsure if she wants to participate in PT but does state that she wants to avoid falling;   OBJECTIVE:   Patient Surveys  ABC: Deferred  Cognition Patient is oriented to person, place, and time.  Recent memory is intact.  Remote memory is intact.  Attention span and concentration are intact.  Expressive speech is intact.  Patient's fund of knowledge is within normal limits for educational level.    Gross Musculoskeletal Assessment Tremor: None Bulk: Normal Tone: Normal  Posture: Forward head and rounded shoulders;  AROM No gross deficits noted in UE/LE function ROM;  LE MMT: MMT (out of 5) Right  Left   Hip flexion 4+ 4+  Knee flexion 5 5  Knee extension 5 5  Ankle dorsiflexion 5 5  Ankle plantarflexion Active Active  (* = pain; Blank rows = not  tested)  Sensation Deferred  Cranial Nerves Deferred  Coordination/Cerebellar Deferred  Transfers: Assistive device utilized: None  Sit to stand: Complete Independence Stand to sit: Complete Independence Chair to chair: Complete Independence Floor: not assessed  Curb:  Deferred  Stairs: Level of Assistance: Complete Independence Stair Negotiation Technique: Alternating Pattern  with No Rails Number of Stairs: 4   Height of Stairs: 6  Comments: Slight decrease in speed and confidence but no overt LOB  Gait: Distance walked: 150' Assistive device utilized: None Level of assistance: Complete Independence Comments: Pt does demonstrate occasional stagger step during gait but no overt LOB;  mCTSIB: 30s in conditions 1-3, 20s in condition 4;  Functional Outcome Measures  Results Comments  BERG 51/56   DGI 23/24   FGA    TUG    5TSTS 11.0s   6 Minute Walk Test    10 Meter Gait Speed WNL   (Blank rows = not tested)    TODAY'S TREATMENT    SUBJECTIVE: Pt reports that she is doing well today. No changes since the last therapy session. She has been performing her HEP. Denies pain and no falls. No specific questions or concerns.    PAIN: Denies   Therapeutic Activity  NuStep L2-6 x 10 minutes for BLE strength as well as cardiopulmonary endurance during interval history with therapist adjusting resistance throughout; 6 and 12 forward step-ups without UE support alternating leading LE x 10 each on both levels; Nautilus resisted gait 80# forward, backward, R lateral and L lateral x 5 each direction; Sit to stand from chair with Airex pad under feet and 8# overhead med ball press 2 x 10;   Neuromuscular Re-education  Airex alternating 12 step taps without UE support x 15 BLE; Airex feet together eyes open/closed x 60s each; Airex feet together eyes open horizontal and vertical head turns x 60s each; Airex ball passes around body with return pass at varying  heights and head/eye follow x multiple bouts on each side;   Not performed: Airex balance beam tandem gait in // bars no  UE support x multiple lengths; each; Airex balance beam side stepping in // bars no UE support x multiple lengths; Forward gait in hallway with Rockerboard A/P orientation static balance x 30s; Rockerboard A/P orientation forward/backward weight shifting x 30s each; Rockerboard A/P orientation with horizontal and vertical head turns x 30s each; Updated HEP and reviewed with patient;   PATIENT EDUCATION:  Education details: Pt educated throughout session about proper posture and technique with exercises. Improved exercise technique, movement at target joints, use of target muscles after min to mod verbal, visual, tactile cues. Balance exercises Person educated: Patient Education method: Explanation, Verbal cues, and Handouts Education comprehension: verbalized understanding, returned demonstration, verbal cues required, and needs further education   HOME EXERCISE PROGRAM:  Access Code: [email or web address] URL: [email or web address] Date: 05/06/2024 Prepared by: Selinda Eck  Exercises - Standing Tandem Balance with Counter Support  - 2 x daily - 7 x weekly - 2 reps - 30s hold - Standing Tandem Balance with Counter Support (Mirrored)  - 2 x daily - 7 x weekly - 2 reps - 30s hold - Standing Single Leg Stance with Counter Support  - 2 x daily - 7 x weekly - 2 reps - 30s hold - Standing Single Leg Stance with Counter Support (Mirrored)  - 2 x daily - 7 x weekly - 2 reps - 30s hold - Tandem Walking Next to Counter  - 2 x daily - 7 x weekly - 2 reps - 30s hold - Mini Squat with Counter Support  - 2 x daily - 7 x weekly - 2 sets - 10 reps - Sit to Stand Without Arm Support  - 2 x daily - 7 x weekly - 2 sets - 10 reps   ASSESSMENT:  CLINICAL IMPRESSION: Progressed balance and strengthening exercises during session today with patient. No changes in HEP but will  consider at next session. Will consider updating outcome measures/goals. Pt encouraged to follow-up as scheduled. She will benefit from PT services to address deficits in strength, balance, and mobility in order to return to improve function at home and decrease her risk for falls.    OBJECTIVE IMPAIRMENTS: decreased balance.   ACTIVITY LIMITATIONS: standing, squatting, and stairs  PARTICIPATION LIMITATIONS: shopping, community activity, and yard work  PERSONAL FACTORS: Behavior pattern, Past/current experiences, and 1 comorbidity: HTN are also affecting patient's functional outcome.   REHAB POTENTIAL: Fair    CLINICAL DECISION MAKING: Stable/uncomplicated  EVALUATION COMPLEXITY: Low   GOALS: Goals reviewed with patient? No  SHORT TERM GOALS: Target date: 05/23/2024  Pt will be independent with HEP in order to improve strength and balance in order to decrease fall risk and improve function at home. Baseline:  Goal status: INITIAL   LONG TERM GOALS: Target date: 06/20/2024  Pt will improve ABC by at least 13% in order to demonstrate clinically significant improvement in balance confidence.  Baseline: 05/01/24: 35.6% Goal status: INITIAL  2.  Pt will improve BERG by at least 3 points in order to demonstrate clinically significant improvement in balance.   Baseline: 51/56 Goal status: INITIAL  3.  Pt will improve MiniBEST by at least 3 points in order to demonstrate clinically significant improvement in balance.     Baseline: 05/01/24: 22/28; Goal status: INITIAL  PLAN: PT FREQUENCY: 1x/week  PT DURATION: 8 weeks  PLANNED INTERVENTIONS: Therapeutic exercises, Therapeutic activity, Neuromuscular re-education, Balance training, Gait training, Patient/Family education, Self Care, Joint mobilization, Joint manipulation, Vestibular training, Canalith repositioning, Orthotic/Fit training, DME instructions, Dry Needling, Electrical  stimulation, Spinal manipulation, Spinal  mobilization, Cryotherapy, Moist heat, Taping, Traction, Ultrasound, Ionotophoresis 4mg /ml Dexamethasone, Manual therapy, and Re-evaluation.  PLAN FOR NEXT SESSION: Update outcome measures/goals, progress balance/strength/reaction time exercises, modify HEP as necessary;   Reyanne Hussar D Elpidio Thielen PT, DPT, GCS  Veleta Yamamoto 05/15/2024, 1:31 PM

## 2024-05-22 ENCOUNTER — Ambulatory Visit

## 2024-05-28 DIAGNOSIS — L508 Other urticaria: Secondary | ICD-10-CM | POA: Diagnosis not present

## 2024-05-29 NOTE — Therapy (Signed)
 OUTPATIENT PHYSICAL THERAPY BALANCE TREATMENT/DISCHARGE  Patient Name: Victoria Rush MRN: [id number] DOB:[birth year], 78 y.o., female Today's Date: 06/03/2024  END OF SESSION:  PT End of Session - 06/03/24 0803     Visit Number 5    Number of Visits 9    Date for Recertification  06/20/24    Authorization Type eval: 04/25/24    PT Start Time 0800    PT Stop Time 0840    PT Time Calculation (min) 40 min    Equipment Utilized During Treatment Gait belt    Activity Tolerance Patient tolerated treatment well    Behavior During Therapy WFL for tasks assessed/performed         Past Medical History:  Diagnosis Date   Cancer (HCC) 1981   melanoma   Contact dermatitis 02/12/2016   GERD (gastroesophageal reflux disease)    Hyperlipidemia    Hypertension    Skin cancer 04/2019   on the nose   Thyroid  disease    Past Surgical History:  Procedure Laterality Date   APPENDECTOMY     COLONOSCOPY  2008   Dr Reeta- repeat in 10 years   SKIN CANCER EXCISION     foot   Patient Active Problem List   Diagnosis Date Noted   Esophageal reflux 02/12/2016   Essential hypertension 02/12/2016   Chronic seasonal allergic rhinitis due to pollen 12/04/2015   Hypothyroidism 01/30/2015   Hypercholesterolemia 01/30/2015   PCP: Kotturi, Vinay K, MD  REFERRING PROVIDER: Kotturi, Vinay K, MD   REFERRING DIAG: Z91.81 (ICD-10-CM) - At high risk for falls   RATIONALE FOR EVALUATION AND TREATMENT: Rehabilitation  THERAPY DIAG: Unsteadiness on feet  ONSET DATE: Approximately 1 year;  FOLLOW-UP APPT SCHEDULED WITH REFERRING PROVIDER: Yes   FROM INITIAL EVALUATION SUBJECTIVE:                                                                                                                                                                                         SUBJECTIVE STATEMENT:  Unsteadiness  PERTINENT HISTORY:  Pt reports unsteadiness for at least the last year. She walks a lot and works in  the yard and notices that she occasionally stumbles. Pt has not had any falls and she has participated in PT in the past for her back but has never worked on her balance. She is unsure if she would like to participate with therapy and initially declined the referral from her PCP.     Pain: No Numbness/Tingling: Yes, occasional R foot tingling; Focal Weakness: No Recent changes in overall health/medication: No Prior history of physical therapy for balance:  No, PT for back pain  but no PT for balance Dominant hand: right Red flags: Positive for melanoma. Negative for abdominal pain, chills/fever, night sweats, nausea, vomiting, unrelenting pain;  PRECAUTIONS: Fall  WEIGHT BEARING RESTRICTIONS: No  FALLS: Has patient fallen in last 6 months? No  Living Environment Lives with: lives with their son, dtr lives next door Lives in: House/apartment Stairs: steps and ramp to enter; Has following equipment at home: Single point cane and Walker - 2 wheeled  Prior level of function: Independent  Occupational demands: Not working  Hobbies: Working in the yard, sewing, pension scheme manager;  Patient Goals: Pt unsure if she wants to participate in PT but does state that she wants to avoid falling;   OBJECTIVE:   Patient Surveys  ABC: Deferred  Cognition Patient is oriented to person, place, and time.  Recent memory is intact.  Remote memory is intact.  Attention span and concentration are intact.  Expressive speech is intact.  Patient's fund of knowledge is within normal limits for educational level.    Gross Musculoskeletal Assessment Tremor: None Bulk: Normal Tone: Normal  Posture: Forward head and rounded shoulders;  AROM No gross deficits noted in UE/LE function ROM;  LE MMT: MMT (out of 5) Right  Left   Hip flexion 4+ 4+  Knee flexion 5 5  Knee extension 5 5  Ankle dorsiflexion 5 5  Ankle plantarflexion Active Active  (* = pain; Blank rows = not  tested)  Sensation Deferred  Cranial Nerves Deferred  Coordination/Cerebellar Deferred  Transfers: Assistive device utilized: None  Sit to stand: Complete Independence Stand to sit: Complete Independence Chair to chair: Complete Independence Floor: not assessed  Curb:  Deferred  Stairs: Level of Assistance: Complete Independence Stair Negotiation Technique: Alternating Pattern  with No Rails Number of Stairs: 4   Height of Stairs: 6  Comments: Slight decrease in speed and confidence but no overt LOB  Gait: Distance walked: 150' Assistive device utilized: None Level of assistance: Complete Independence Comments: Pt does demonstrate occasional stagger step during gait but no overt LOB;  mCTSIB: 30s in conditions 1-3, 20s in condition 4;  Functional Outcome Measures  Results Comments  BERG 51/56   DGI 23/24   FGA    TUG    5TSTS 11.0s   6 Minute Walk Test    10 Meter Gait Speed WNL   (Blank rows = not tested)    TODAY'S TREATMENT    SUBJECTIVE: Pt reports that she is doing well today. No changes since the last therapy session. She has been performing her HEP. Denies pain and no falls. No specific questions or concerns. She is ready for discharge today.    PAIN: Denies   Therapeutic Activity  NuStep L2-6 x 10 minutes for BLE strength as well as cardiopulmonary endurance during interval history with therapist adjusting resistance throughout;  Functional Outcome Measures  04/25/24 06/03/24 Comments  BERG 51/56 52/56 Mild deficits  DGI 23/24    FGA     TUG     5TSTS 11.0s 9.9s WNL  6 Minute Walk Test     10 Meter Gait Speed WNL    (Blank rows = not tested)  Mini BESTest: 23/28   Neuromuscular Re-education  Feet together eyes open with head/shoulder turns x 60s; Feet together eyes closed horizontal and vertical head turns x 60s each; HEP updated and reviewed with patient and discharge instructions provided;   PATIENT EDUCATION:  Education  details: Outcome measures, balance exercises, updated HEP, discharge Person educated: Patient Education method:  Explanation, Verbal cues, and Handouts Education comprehension: verbalized understanding, returned demonstration, verbal cues required, and needs further education   HOME EXERCISE PROGRAM:  Access Code: [email or web address] URL: [email or web address] Date: 06/03/2024 Prepared by: Selinda Eck  Exercises - Standing Tandem Balance with Counter Support  - 2 x daily - 7 x weekly - 2 reps - 30s hold - Standing Tandem Balance with Counter Support (Mirrored)  - 2 x daily - 7 x weekly - 2 reps - 30s hold - Standing Single Leg Stance with Counter Support  - 2 x daily - 7 x weekly - 2 reps - 30s hold - Standing Single Leg Stance with Counter Support (Mirrored)  - 2 x daily - 7 x weekly - 2 reps - 30s hold - Tandem Walking Next to Counter  - 2 x daily - 7 x weekly - 2 reps - 30s hold - Mini Squat with Counter Support  - 2 x daily - 7 x weekly - 2 sets - 10 reps - Sit to Stand Without Arm Support  - 2 x daily - 7 x weekly - 2 sets - 10 reps - Corner Balance Feet Together: Eyes Closed With Head Turns  - 2 x daily - 7 x weekly - 2 reps - 30s hold   ASSESSMENT:  CLINICAL IMPRESSION: Updated outcome measures with patient during visit today. Her ABC scale improved from 35.6% at the initial evaluation to 65.6% today. Her BERG and Mini BESTest also both improved slightly. Continued balance exercises during additional time and updated HEP. She is ready for discharge today. Encouraged to follow-up with PCP if symptoms worsen or fail to continue improve with her home exercise program.  OBJECTIVE IMPAIRMENTS: decreased balance.   ACTIVITY LIMITATIONS: standing, squatting, and stairs  PARTICIPATION LIMITATIONS: shopping, community activity, and yard work  PERSONAL FACTORS: Behavior pattern, Past/current experiences, and 1 comorbidity: HTN are also affecting patient's functional outcome.    REHAB POTENTIAL: Fair    CLINICAL DECISION MAKING: Stable/uncomplicated  EVALUATION COMPLEXITY: Low   GOALS: Goals reviewed with patient? No  SHORT TERM GOALS: Target date: 05/23/2024  Pt will be independent with HEP in order to improve strength and balance in order to decrease fall risk and improve function at home. Baseline:  Goal status: ACHIEVED   LONG TERM GOALS: Target date: 06/20/2024  Pt will improve ABC by at least 13% in order to demonstrate clinically significant improvement in balance confidence.  Baseline: 05/01/24: 35.6%; 06/03/24: 65.6% Goal status: ACHIEVED  2.  Pt will improve BERG by at least 3 points in order to demonstrate clinically significant improvement in balance.   Baseline: 51/56, 06/03/24: 52/56; Goal status: PARTIALLY MET  3.  Pt will improve MiniBEST by at least 3 points in order to demonstrate clinically significant improvement in balance.     Baseline: 05/01/24: 22/28; 06/03/24: 23/28; Goal status: PARTIALLY MET  PLAN: PT FREQUENCY: 1x/week  PT DURATION: 8 weeks  PLANNED INTERVENTIONS: Therapeutic exercises, Therapeutic activity, Neuromuscular re-education, Balance training, Gait training, Patient/Family education, Self Care, Joint mobilization, Joint manipulation, Vestibular training, Canalith repositioning, Orthotic/Fit training, DME instructions, Dry Needling, Electrical stimulation, Spinal manipulation, Spinal mobilization, Cryotherapy, Moist heat, Taping, Traction, Ultrasound, Ionotophoresis 4mg /ml Dexamethasone, Manual therapy, and Re-evaluation.  PLAN FOR NEXT SESSION: Discharge   Selinda JONETTA Eck PT, DPT, GCS  Arlinda Barcelona 06/03/2024, 8:45 AM

## 2024-06-03 ENCOUNTER — Ambulatory Visit

## 2024-06-03 ENCOUNTER — Other Ambulatory Visit: Payer: Self-pay

## 2024-06-03 DIAGNOSIS — K219 Gastro-esophageal reflux disease without esophagitis: Secondary | ICD-10-CM

## 2024-06-03 DIAGNOSIS — E032 Hypothyroidism due to medicaments and other exogenous substances: Secondary | ICD-10-CM

## 2024-06-03 DIAGNOSIS — R2681 Unsteadiness on feet: Secondary | ICD-10-CM | POA: Insufficient documentation

## 2024-06-03 MED ORDER — LEVOTHYROXINE SODIUM 25 MCG PO TABS: 25.0000 ug | ORAL_TABLET | Freq: Every day | ORAL | 0 refills | Status: DC

## 2024-06-03 MED ORDER — PANTOPRAZOLE SODIUM 40 MG PO TBEC: 40.0000 mg | DELAYED_RELEASE_TABLET | Freq: Every day | ORAL | 0 refills | Status: DC

## 2024-06-19 ENCOUNTER — Telehealth: Payer: Self-pay | Admitting: Family Medicine

## 2024-06-19 DIAGNOSIS — I10 Essential (primary) hypertension: Secondary | ICD-10-CM

## 2024-06-19 DIAGNOSIS — E032 Hypothyroidism due to medicaments and other exogenous substances: Secondary | ICD-10-CM

## 2024-06-19 DIAGNOSIS — K219 Gastro-esophageal reflux disease without esophagitis: Secondary | ICD-10-CM

## 2024-06-19 DIAGNOSIS — E7800 Pure hypercholesterolemia, unspecified: Secondary | ICD-10-CM

## 2024-06-19 MED ORDER — PANTOPRAZOLE SODIUM 40 MG PO TBEC: 40.0000 mg | DELAYED_RELEASE_TABLET | Freq: Every day | ORAL | 3 refills | Status: AC

## 2024-06-19 MED ORDER — LOSARTAN POTASSIUM 50 MG PO TABS: 50.0000 mg | ORAL_TABLET | Freq: Every day | ORAL | 3 refills | Status: AC

## 2024-06-19 MED ORDER — LEVOTHYROXINE SODIUM 25 MCG PO TABS: 25.0000 ug | ORAL_TABLET | Freq: Every day | ORAL | 3 refills | Status: AC

## 2024-06-19 MED ORDER — ATORVASTATIN CALCIUM 10 MG PO TABS: 10.0000 mg | ORAL_TABLET | Freq: Every day | ORAL | 3 refills | Status: AC

## 2024-06-19 NOTE — Addendum Note (Signed)
 Addended by: LOVENIA BRISKER R on: 06/19/2024 01:00 PM   Modules accepted: Orders

## 2024-06-19 NOTE — Telephone Encounter (Signed)
 Patient ask that her refills be 90 day supply for   levothyroxine  25 mcg losartan  (COZAAR ) 50 MG tablet  pantoprazole  (PROTONIX ) 40 MG

## 2024-07-11 ENCOUNTER — Other Ambulatory Visit: Payer: Self-pay | Admitting: Family Medicine

## 2024-07-11 DIAGNOSIS — J301 Allergic rhinitis due to pollen: Secondary | ICD-10-CM

## 2024-07-12 NOTE — Telephone Encounter (Signed)
 Too soon for refill.  Requested Prescriptions  Pending Prescriptions Disp Refills   montelukast  (SINGULAIR ) 10 MG tablet [Pharmacy Med Name: Montelukast  Sodium 10 MG Oral Tablet] 90 tablet 0    Sig: TAKE 1 TABLET BY MOUTH AT BEDTIME     Pulmonology:  Leukotriene Inhibitors Passed - 07/12/2024 12:12 PM      Passed - Valid encounter within last 12 months    Recent Outpatient Visits           3 months ago Essential hypertension   [place] [facility], Vinay K, MD   3 months ago Contact dermatitis, unspecified contact dermatitis type, unspecified trigger   [place] [facility], Toribio SQUIBB, PA   4 months ago Fungal infection of skin    [facility], Vinay K, MD   9 months ago Arthritis   [facility] at [facility] Cathryne JAYSON, MD   9 months ago Annual physical exam   [facility] at [facility], Deanna C, MD

## 2024-10-07 ENCOUNTER — Encounter: Admitting: Physician Assistant

## 2024-10-07 ENCOUNTER — Ambulatory Visit: Admitting: Family Medicine
